# Patient Record
Sex: Female | Born: 1990 | Race: Black or African American | Hispanic: No | Marital: Married | Smoking: Never smoker
Health system: Southern US, Community
[De-identification: ages and names within clinical notes are randomized; demographics above are authoritative.]

## PROBLEM LIST (undated history)

## (undated) ENCOUNTER — Inpatient Hospital Stay (HOSPITAL_COMMUNITY): Payer: Self-pay

## (undated) DIAGNOSIS — L258 Unspecified contact dermatitis due to other agents: Secondary | ICD-10-CM

## (undated) DIAGNOSIS — O10919 Unspecified pre-existing hypertension complicating pregnancy, unspecified trimester: Secondary | ICD-10-CM

## (undated) DIAGNOSIS — Z8759 Personal history of other complications of pregnancy, childbirth and the puerperium: Secondary | ICD-10-CM

## (undated) DIAGNOSIS — N200 Calculus of kidney: Secondary | ICD-10-CM

## (undated) HISTORY — PX: TONSILLECTOMY: SUR1361

## (undated) HISTORY — DX: Unspecified pre-existing hypertension complicating pregnancy, unspecified trimester: O10.919

## (undated) HISTORY — DX: Unspecified contact dermatitis due to other agents: L25.8

## (undated) HISTORY — DX: Personal history of other complications of pregnancy, childbirth and the puerperium: Z87.59

---
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
## 2010-02-10 ENCOUNTER — Encounter: Payer: Self-pay | Admitting: Family Medicine

## 2010-02-10 LAB — CONVERTED CEMR LAB: Vit D, 25-Hydroxy: 22 ng/mL — ABNORMAL LOW (ref 30–89)

## 2010-04-06 ENCOUNTER — Ambulatory Visit: Payer: Self-pay | Admitting: Obstetrics and Gynecology

## 2010-04-10 ENCOUNTER — Ambulatory Visit (HOSPITAL_COMMUNITY): Admission: RE | Admit: 2010-04-10 | Discharge: 2010-04-10 | Payer: Self-pay | Admitting: Family Medicine

## 2011-01-30 NOTE — Assessment & Plan Note (Signed)
NAME:  Cynthia Crawford, Cynthia Crawford            ACCOUNT NO.:  [id number]   MEDICAL RECORD NO.:  [id number]          PATIENT TYPE:  POB   LOCATION:  [facility] at [facility]         FACILITY:  [facility]   PHYSICIAN:  Tinnie Gens, MD        DATE OF BIRTH:  12-May-1991   DATE OF SERVICE:  11/24/2009                                  CLINIC NOTE   CHIEF COMPLAINT:  Questionable heavy cycles, presyncope, and low iron.   HISTORY OF PRESENT ILLNESS:  This is an 20 year old nullipara, who is  not on any birth control and has 5-day cycles.  She states she has  passed out at school and had a CBC drawn and was told her iron was less,  she does not remember the exact number.  The patient also needs Gardasil  #2 today.  The patient has no interest in birth control at present.  She  had a physical in June of last year.  She has not needed Pap smears as  yet.  She reports having some anxiety and getting hot and almost passed  out when she gets her hair done as well.  She does not eat anything that  is not a food and she has no history of abnormal hemoglobins that she  knows of.   PHYSICAL EXAMINATION:  VITAL SIGNS:  Today, her blood pressure is 126/86  and weight is 116.  GENERAL:  She is a well-developed, well-nourished female, in no acute  distress.  ABDOMEN:  Soft, nontender, nondistended.   IMPRESSION:  1. Questionable heavy cycles with no desire to be placed on birth      control for modulation of set cycles.  2. History of low iron.  3. Presyncope, questionably related to history of low iron.   PLAN:  Check CBC, hemoglobin electrophoresis, iron level, ferritin, RBC,  folate, B12, and vitamin D levels today.           ______________________________  Tinnie Gens, MD     TP/MEDQ  D:  11/24/2009  T:  11/25/2009  Job:  [id number]

## 2011-01-30 NOTE — Assessment & Plan Note (Signed)
NAME:  [name], [name]            ACCOUNT NO.:  [id number]   MEDICAL RECORD NO.:  [id number]          PATIENT TYPE:  POB   LOCATION:  [facility] at [facility]         FACILITY:  [facility]   PHYSICIAN:  Allie Bossier, MD        DATE OF BIRTH:  31-Dec-1990   DATE OF SERVICE:  02/28/2009                                  CLINIC NOTE   Cynthia Crawford is an 20 year old single white gravida 0, who is here for her  annual exam.  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  She has no  particular complaints.   PAST MEDICAL HISTORY:  None.   PAST SURGICAL HISTORY:  None.   ALLERGIES:  No known latex allergies.   DRUG ALLERGIES:  PENICILLIN and SULFA cause a rash.   MEDICATIONS:  Zyrtec 1 a day as necessary.   REVIEW OF SYSTEMS:  She has been monogamous with her only sexual partner  for the last 18 months.  She is using condoms faithfully menarche was  at 20 years of age.  All other remaining review of review of systems  questions are negative.  She also repeat Pap.   PHYSICAL EXAMINATION:  VITAL SIGNS:  Stable.  GENERAL:  Afebrile, well-nourished, well-hydrated, pleasant black  female, in no apparent distress.  HEENT:  Normal breast exam normal bilaterally.  HEART:  Regular rate and rhythm.  LUNGS:  Clear to auscultation bilaterally.  ABDOMEN:  Scaphoid benign.  No hepatosplenomegaly.  EXTERNAL GENITALIA:  Shaved no lesions.  Cervix nulliparous.  Normal  discharge.  No cervical motion tenderness.  BIMANUAL:  Small, retroverted mobile uterus.  Adnexa are nontender.  No  masses.   ASSESSMENT AND PLAN:  1. Annual exam.  I have recommended she start getting Pap smears at      age 33.  I did cervical cultures today.  She declines alternative      forms of birth control.  2. I filled her physical exam form.  She will get her vaccination      history from her pediatrician.  I discussed Gardasil.  She will get      her first one  today and the next one in 2 months, and the third one      in 6 months.      Allie Bossier, MD     MCD/MEDQ  D:  02/28/2009  T:  03/01/2009  Job:  [id number]

## 2011-01-30 NOTE — Assessment & Plan Note (Signed)
NAMESHYNE, RESCH            ACCOUNT NO.:  [id number]   MEDICAL RECORD NO.:  [id number]          PATIENT TYPE:  POB   LOCATION:  [facility] at [facility]         FACILITY:  [facility]   PHYSICIAN:  Argentina Donovan, MD        DATE OF BIRTH:  [birth year]   DATE OF SERVICE:  04/06/2010                                  CLINIC NOTE   The patient is a 20 year old nulligravida, African American female,  sophomore nursing student who yesterday had the onset of her period with  severe abdominal pain, nausea, and vomiting that has abated completely  today.  She never had that happen with a period except the very first  time she had a period.   PHYSICAL EXAMINATION:  ABDOMEN:  Soft, flat, nontender.  No masses or  organomegaly.  GENITALIA:  The patient has not had a genital examination in the past.   Being sexually active, here with her mother, and I described the lesser  cycle of the prostaglandin and the fact that may cause these menstrual  cramps and that use of ibuprofen cyclically or birth control pills would  probably control that.  We are going to get her an ultrasound just to  make sure we are not missing ovarian cyst or something similar.  They  seemed to understand the problem and are able to handle that quite  easily.   IMPRESSION:  One episode, severe dysmenorrhea lasting 1 day with  gastrointestinal symptoms.           ______________________________  Argentina Donovan, MD     PR/MEDQ  D:  04/06/2010  T:  04/07/2010  Job:  [id number]

## 2011-03-20 ENCOUNTER — Other Ambulatory Visit: Payer: Self-pay | Admitting: Family Medicine

## 2011-03-20 NOTE — Telephone Encounter (Signed)
Refill request

## 2011-09-25 ENCOUNTER — Ambulatory Visit (INDEPENDENT_AMBULATORY_CARE_PROVIDER_SITE_OTHER): Payer: BC Managed Care – PPO | Admitting: Nurse Practitioner

## 2011-09-25 ENCOUNTER — Encounter: Payer: Self-pay | Admitting: Nurse Practitioner

## 2011-09-25 VITALS — BP 128/84 | HR 72 | Ht 62.0 in | Wt 120.0 lb

## 2011-09-25 DIAGNOSIS — Z23 Encounter for immunization: Secondary | ICD-10-CM

## 2011-09-25 DIAGNOSIS — Z309 Encounter for contraceptive management, unspecified: Secondary | ICD-10-CM

## 2011-09-25 DIAGNOSIS — G43909 Migraine, unspecified, not intractable, without status migrainosus: Secondary | ICD-10-CM

## 2011-09-25 DIAGNOSIS — E559 Vitamin D deficiency, unspecified: Secondary | ICD-10-CM

## 2011-09-25 DIAGNOSIS — Z01419 Encounter for gynecological examination (general) (routine) without abnormal findings: Secondary | ICD-10-CM

## 2011-09-25 DIAGNOSIS — Z0000 Encounter for general adult medical examination without abnormal findings: Secondary | ICD-10-CM

## 2011-09-25 NOTE — Progress Notes (Signed)
Subjective:     Patient ID: Cynthia Crawford, female   DOB: 03/06/1991, 21 y.o.   MRN: [id number]  HPI Pt in today for annual GYN exam. Currently sexually active and not using protection. Her parents are preachers and she does not want them to know she is sexually active. 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 C/O migraine and anxiety. Wants flu vaccine and Vit D checked.   Review of Systems  Constitutional: Positive for fatigue. Negative for chills, diaphoresis, activity change and appetite change.  Respiratory: Negative for wheezing.   Neurological: Positive for light-headedness and headaches.  Psychiatric/Behavioral: The patient is nervous/anxious.        Objective:   Physical Exam  Constitutional: She is oriented to person, place, and time. She appears well-developed and well-nourished.  HENT:  Head: Normocephalic and atraumatic.  Eyes: Pupils are equal, round, and reactive to light.  Neck: Normal range of motion. No tracheal deviation present. No thyromegaly present.  Cardiovascular: Normal rate and regular rhythm.  Exam reveals no gallop and no friction rub.   No murmur heard. Pulmonary/Chest: No respiratory distress. She has no wheezes. She has no rales. She exhibits no tenderness.  Abdominal: She exhibits no distension and no mass. There is no tenderness. There is no rebound and no guarding.  Genitourinary: Vagina normal and uterus normal.  Musculoskeletal: Normal range of motion.  Neurological: She is alert and oriented to person, place, and time. She has normal reflexes.  Skin: Skin is warm and dry.  Psychiatric: She has a normal mood and affect.       Assessment:     Annual GYN Exam Contraception Migraine Anxiety    Plan:     GC/ Chlamydia done today. Will start pap smears at age 21. Lengthy discussion concerning birth control options. Advised she may use [facility] or Health Dept or school Health without her parents knowledge. Given Flu vaccine  today. Will check Vit D levels per pt request. Advised she can make a follow up appointment for headaches and anxiety. Declines refill Proventil.

## 2011-09-26 LAB — VITAMIN D 25 HYDROXY (VIT D DEFICIENCY, FRACTURES): Vit D, 25-Hydroxy: 26 ng/mL — ABNORMAL LOW (ref 30–89)

## 2011-09-26 LAB — GC/CHLAMYDIA PROBE AMP, GENITAL: GC Probe Amp, Genital: NEGATIVE

## 2012-08-29 ENCOUNTER — Other Ambulatory Visit: Payer: Self-pay | Admitting: Family Medicine

## 2012-09-19 ENCOUNTER — Ambulatory Visit: Payer: BC Managed Care – PPO | Admitting: Obstetrics & Gynecology

## 2012-09-26 ENCOUNTER — Ambulatory Visit (INDEPENDENT_AMBULATORY_CARE_PROVIDER_SITE_OTHER): Payer: BC Managed Care – PPO | Admitting: Obstetrics & Gynecology

## 2012-09-26 ENCOUNTER — Encounter: Payer: Self-pay | Admitting: Obstetrics & Gynecology

## 2012-09-26 VITALS — BP 135/98 | HR 80 | Ht 64.0 in | Wt 115.0 lb

## 2012-09-26 DIAGNOSIS — Z113 Encounter for screening for infections with a predominantly sexual mode of transmission: Secondary | ICD-10-CM

## 2012-09-26 DIAGNOSIS — Z0000 Encounter for general adult medical examination without abnormal findings: Secondary | ICD-10-CM

## 2012-09-26 DIAGNOSIS — Z124 Encounter for screening for malignant neoplasm of cervix: Secondary | ICD-10-CM

## 2012-09-26 DIAGNOSIS — Z1151 Encounter for screening for human papillomavirus (HPV): Secondary | ICD-10-CM

## 2012-09-26 DIAGNOSIS — Z01419 Encounter for gynecological examination (general) (routine) without abnormal findings: Secondary | ICD-10-CM

## 2012-09-26 DIAGNOSIS — Z23 Encounter for immunization: Secondary | ICD-10-CM

## 2012-09-26 NOTE — Patient Instructions (Signed)
Contraception Choices  Contraception (birth control) is the use of any methods or devices to prevent pregnancy. Below are some methods to help avoid pregnancy.  HORMONAL METHODS   · Contraceptive implant. This is a thin, plastic tube containing progesterone hormone. It does not contain estrogen hormone. Your caregiver inserts the tube in the inner part of the upper arm. The tube can remain in place for up to 3 years. After 3 years, the implant must be removed. The implant prevents the ovaries from releasing an egg (ovulation), thickens the cervical mucus which prevents sperm from entering the uterus, and thins the lining of the inside of the uterus.  · Progesterone-only injections. These injections are given every 3 months by your caregiver to prevent pregnancy. This synthetic progesterone hormone stops the ovaries from releasing eggs. It also thickens cervical mucus and changes the uterine lining. This makes it harder for sperm to survive in the uterus.  · Birth control pills. These pills contain estrogen and progesterone hormone. They work by stopping the egg from forming in the ovary (ovulation). Birth control pills are prescribed by a caregiver. Birth control pills can also be used to treat heavy periods.  · Minipill. This type of birth control pill contains only the progesterone hormone. They are taken every day of each month and must be prescribed by your caregiver.  · Birth control patch. The patch contains hormones similar to those in birth control pills. It must be changed once a week and is prescribed by a caregiver.  · Vaginal ring. The ring contains hormones similar to those in birth control pills. It is left in the vagina for 3 weeks, removed for 1 week, and then a new one is put back in place. The patient must be comfortable inserting and removing the ring from the vagina. A caregiver's prescription is necessary.  · Emergency contraception. Emergency contraceptives prevent pregnancy after unprotected  sexual intercourse. This pill can be taken right after sex or up to 5 days after unprotected sex. It is most effective the sooner you take the pills after having sexual intercourse. Emergency contraceptive pills are available without a prescription. Check with your pharmacist. Do not use emergency contraception as your only form of birth control.  BARRIER METHODS   · Female condom. This is a thin sheath (latex or rubber) that is worn over the penis during sexual intercourse. It can be used with spermicide to increase effectiveness.  · Female condom. This is a soft, loose-fitting sheath that is put into the vagina before sexual intercourse.  · Diaphragm. This is a soft, latex, dome-shaped barrier that must be fitted by a caregiver. It is inserted into the vagina, along with a spermicidal jelly. It is inserted before intercourse. The diaphragm should be left in the vagina for 6 to 8 hours after intercourse.  · Cervical cap. This is a round, soft, latex or plastic cup that fits over the cervix and must be fitted by a caregiver. The cap can be left in place for up to 48 hours after intercourse.  · Sponge. This is a soft, circular piece of polyurethane foam. The sponge has spermicide in it. It is inserted into the vagina after wetting it and before sexual intercourse.  · Spermicides. These are chemicals that kill or block sperm from entering the cervix and uterus. They come in the form of creams, jellies, suppositories, foam, or tablets. They do not require a prescription. They are inserted into the vagina with an applicator before having sexual intercourse.   The process must be repeated every time you have sexual intercourse.  INTRAUTERINE CONTRACEPTION  · Intrauterine device (IUD). This is a T-shaped device that is put in a woman's uterus during a menstrual period to prevent pregnancy. There are 2 types:  · Copper IUD. This type of IUD is wrapped in copper wire and is placed inside the uterus. Copper makes the uterus and  fallopian tubes produce a fluid that kills sperm. It can stay in place for 10 years.  · Hormone IUD. This type of IUD contains the hormone progestin (synthetic progesterone). The hormone thickens the cervical mucus and prevents sperm from entering the uterus, and it also thins the uterine lining to prevent implantation of a fertilized egg. The hormone can weaken or kill the sperm that get into the uterus. It can stay in place for 5 years.  PERMANENT METHODS OF CONTRACEPTION  · Female tubal ligation. This is when the woman's fallopian tubes are surgically sealed, tied, or blocked to prevent the egg from traveling to the uterus.  · Female sterilization. This is when the female has the tubes that carry sperm tied off (vasectomy). This blocks sperm from entering the vagina during sexual intercourse. After the procedure, the man can still ejaculate fluid (semen).  NATURAL PLANNING METHODS  · Natural family planning. This is not having sexual intercourse or using a barrier method (condom, diaphragm, cervical cap) on days the woman could become pregnant.  · Calendar method. This is keeping track of the length of each menstrual cycle and identifying when you are fertile.  · Ovulation method. This is avoiding sexual intercourse during ovulation.  · Symptothermal method. This is avoiding sexual intercourse during ovulation, using a thermometer and ovulation symptoms.  · Post-ovulation method. This is timing sexual intercourse after you have ovulated.  Regardless of which type or method of contraception you choose, it is important that you use condoms to protect against the transmission of sexually transmitted diseases (STDs). Talk with your caregiver about which form of contraception is most appropriate for you.  Document Released: 09/03/2005 Document Revised: 11/26/2011 Document Reviewed: 01/10/2011  ExitCare® Patient Information ©2013 [employer].

## 2012-09-26 NOTE — Progress Notes (Signed)
Subjective:    Cynthia Crawford is a 22 y.o. female who presents for an annual exam. The patient has no complaints today. She does not want a pregnancy but still has unprotected sex. She did not go to the health dept as Bonita Quin recommended last year. She does not want her mother to see on a bill that she is getting birth control. I have again advised the health dept. I have also recommended folic acid daily. The patient is sexually active. This will be her first pap smear.The patient wears seatbelts: yes. The patient participates in regular exercise: yes. Has the patient ever been transfused or tattooed?: no. The patient reports that there is not domestic violence in her life.   Menstrual History: OB History    Grav Para Term Preterm Abortions TAB SAB Ect Mult Living   0 0 0 0 0 0 0 0 0 0       Menarche age: 18 Patient's last menstrual period was 09/17/2012.    The following portions of the patient's history were reviewed and updated as appropriate: allergies, current medications, past family history, past medical history, past social history, past surgical history and problem list.  Review of Systems A comprehensive review of systems was negative. 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 She says that she has had Gardasil series. Flu vaccine today.   Objective:    BP 135/98  Pulse 80  Ht 5\' 4"  (1.626 m)  Wt 115 lb (52.164 kg)  BMI 19.74 kg/m2  LMP 09/17/2012.   Heart- rrr without m,r,g Lungs- CTAB Breast exam- normal BL Abd- benign Vulva- shaved, no lesions NSSA, NT, normal adnexa exam Assessment:    Healthy female exam.    Plan:     Chlamydia specimen. GC specimen. Thin prep Pap smear.  I have definitely recommended birth control. Flu vaccine today.

## 2012-10-06 ENCOUNTER — Ambulatory Visit: Payer: BC Managed Care – PPO | Admitting: *Deleted

## 2013-04-15 ENCOUNTER — Ambulatory Visit: Payer: Self-pay | Admitting: Otolaryngology

## 2013-04-20 ENCOUNTER — Ambulatory Visit (INDEPENDENT_AMBULATORY_CARE_PROVIDER_SITE_OTHER): Payer: BC Managed Care – PPO | Admitting: Obstetrics & Gynecology

## 2013-04-20 ENCOUNTER — Encounter: Payer: Self-pay | Admitting: Obstetrics & Gynecology

## 2013-04-20 VITALS — BP 137/91 | HR 102 | Ht 64.0 in | Wt 112.0 lb

## 2013-04-20 DIAGNOSIS — R300 Dysuria: Secondary | ICD-10-CM

## 2013-04-20 LAB — POCT URINALYSIS DIPSTICK
Bilirubin, UA: NEGATIVE
Glucose, UA: NEGATIVE
Nitrite, UA: NEGATIVE
Protein, UA: NEGATIVE

## 2013-04-20 NOTE — Progress Notes (Signed)
  Subjective:    Patient ID: Cynthia Crawford, female    DOB: [birth year], 22 y.o.   MRN: [id number]  HPI  She is here today because of some dysuria. She had a tonsilectomy 4 days ago and is drinking water.   Review of Systems     Objective:   Physical Exam  U/A negative No CVAT      Assessment & Plan:   Dysuria- send uc&s Rec establish care with family medicine

## 2013-04-21 LAB — URINE CULTURE: Colony Count: NO GROWTH

## 2013-08-26 ENCOUNTER — Other Ambulatory Visit (INDEPENDENT_AMBULATORY_CARE_PROVIDER_SITE_OTHER): Payer: BC Managed Care – PPO | Admitting: *Deleted

## 2013-08-26 DIAGNOSIS — E569 Vitamin deficiency, unspecified: Secondary | ICD-10-CM

## 2013-08-26 DIAGNOSIS — Z23 Encounter for immunization: Secondary | ICD-10-CM

## 2013-08-28 NOTE — Progress Notes (Signed)
Left message for patient to call me back to confirm receipt of message regarding test results and Dr. Berle Mull to refer patient to primary care regarding her low vitamin D levels.

## 2013-08-28 NOTE — Progress Notes (Signed)
Left message for patient regarding results and instructions.  She is to call back to confirm receipt of message.

## 2013-12-25 ENCOUNTER — Encounter: Payer: Self-pay | Admitting: Obstetrics & Gynecology

## 2013-12-25 ENCOUNTER — Ambulatory Visit (INDEPENDENT_AMBULATORY_CARE_PROVIDER_SITE_OTHER): Payer: BC Managed Care – PPO | Admitting: Obstetrics & Gynecology

## 2013-12-25 VITALS — BP 146/99 | HR 79 | Ht 63.0 in | Wt 117.0 lb

## 2013-12-25 DIAGNOSIS — R102 Pelvic and perineal pain: Secondary | ICD-10-CM

## 2013-12-25 DIAGNOSIS — N949 Unspecified condition associated with female genital organs and menstrual cycle: Secondary | ICD-10-CM

## 2013-12-25 DIAGNOSIS — R599 Enlarged lymph nodes, unspecified: Secondary | ICD-10-CM

## 2013-12-25 LAB — POCT URINALYSIS DIPSTICK
BILIRUBIN UA: NEGATIVE
GLUCOSE UA: NEGATIVE
KETONES UA: NEGATIVE
Leukocytes, UA: NEGATIVE
NITRITE UA: NEGATIVE
PROTEIN UA: NEGATIVE
RBC UA: NEGATIVE
SPEC GRAV UA: 1.01
Urobilinogen, UA: NEGATIVE
pH, UA: 6.5

## 2013-12-25 NOTE — Progress Notes (Signed)
Subjective:     Patient ID: Cynthia DaysChasity Crawford, female   DOB: 05/14/1991, 23 y.o.   MRN: [id number]  HPI Pt c/o pain in her groin.  She denies discharge or internal pain. She thinks that she 'might'  have had some urinary sx that seem to have resolved; she also c/o of a rash on her arm that she woke up with.   Review of Systems     Objective:   Physical Exam BP 146/99  Pulse 79  Ht 5\' 3"  (1.6 m)  Wt 117 lb (53.071 kg)  BMI 20.73 kg/m2  LMP 11/29/2013 Pt in NAD Abd: soft, NT, ND GU: EGBUS: very small mobile lymph node palpated in right groin  Cervix: no CMT Uterus: small, mobile Adnexa: no masses; non tender   UA: neg        Assessment:     Pain in right groin- lymph node palpated  Small area of excoriation on right arm    Plan:     Hydrocortisone to rash on right arm F/u prn

## 2013-12-25 NOTE — Patient Instructions (Signed)
Swollen Lymph Nodes  The lymphatic system filters fluid from around cells. It is like a system of blood vessels. These channels carry lymph instead of blood. The lymphatic system is an important part of the immune (disease fighting) system. When people talk about "swollen glands in the neck," they are usually talking about swollen lymph nodes. The lymph nodes are like the little traps for infection. You and your caregiver may be able to feel lymph nodes, especially swollen nodes, in these common areas: the groin (inguinal area), armpits (axilla), and above the clavicle (supraclavicular). You may also feel them in the neck (cervical) and the back of the head just above the hairline (occipital).  Swollen glands occur when there is any condition in which the body responds with an allergic type of reaction. For instance, the glands in the neck can become swollen from insect bites or any type of minor infection on the head. These are very noticeable in children with only minor problems. Lymph nodes may also become swollen when there is a tumor or problem with the lymphatic system, such as Hodgkin's disease.  TREATMENT   · Most swollen glands do not require treatment. They can be observed (watched) for a short period of time, if your caregiver feels it is necessary. Most of the time, observation is not necessary.  · Antibiotics (medicines that kill germs) may be prescribed by your caregiver. Your caregiver may prescribe these if he or she feels the swollen glands are due to a bacterial (germ) infection. Antibiotics are not used if the swollen glands are caused by a virus.  HOME CARE INSTRUCTIONS   · Take medications as directed by your caregiver. Only take over-the-counter or prescription medicines for pain, discomfort, or fever as directed by your caregiver.  SEEK MEDICAL CARE IF:   · If you begin to run a temperature greater than 102° F (38.9° C), or as your caregiver suggests.  MAKE SURE YOU:   · Understand these  instructions.  · Will watch your condition.  · Will get help right away if you are not doing well or get worse.  Document Released: 08/24/2002 Document Revised: 11/26/2011 Document Reviewed: 09/03/2005  ExitCare® Patient Information ©2014 [employer].

## 2014-01-26 ENCOUNTER — Ambulatory Visit: Payer: BC Managed Care – PPO | Admitting: Obstetrics & Gynecology

## 2014-05-07 ENCOUNTER — Ambulatory Visit (INDEPENDENT_AMBULATORY_CARE_PROVIDER_SITE_OTHER): Payer: BC Managed Care – PPO | Admitting: Family Medicine

## 2014-05-07 ENCOUNTER — Encounter: Payer: Self-pay | Admitting: Family Medicine

## 2014-05-07 VITALS — BP 139/89 | HR 69 | Ht 63.0 in | Wt 117.0 lb

## 2014-05-07 DIAGNOSIS — J029 Acute pharyngitis, unspecified: Secondary | ICD-10-CM

## 2014-05-07 DIAGNOSIS — R319 Hematuria, unspecified: Secondary | ICD-10-CM | POA: Diagnosis not present

## 2014-05-07 LAB — POCT URINALYSIS DIPSTICK
BILIRUBIN UA: NEGATIVE
Glucose, UA: NEGATIVE
Ketones, UA: NEGATIVE
Leukocytes, UA: NEGATIVE
NITRITE UA: NEGATIVE
PH UA: 6
Protein, UA: NEGATIVE
SPEC GRAV UA: 1.015
UROBILINOGEN UA: NEGATIVE

## 2014-05-07 MED ORDER — NITROFURANTOIN MONOHYD MACRO 100 MG PO CAPS: 100.0000 mg | ORAL_CAPSULE | Freq: Two times a day (BID) | ORAL | Status: DC

## 2014-05-07 NOTE — Patient Instructions (Signed)

## 2014-05-07 NOTE — Progress Notes (Signed)
    Subjective:    Patient ID: Cynthia Crawford is a 23 y.o. female presenting with Hematuria  on 05/07/2014  HPI: Yesterday had frank blood in her urine.  Had GI bug on yesterday.  Dull flank pain on right side.  Reports some urgency. No hesitancy. She also denies previous UTI. Sore Throat on 3 previous days although she has had a tonsillectomy.  Feels like something might be stuck in her throat.  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  Review of Systems  Constitutional: Negative for fever and chills.  Genitourinary: Positive for dysuria, frequency and flank pain.  Skin: Negative for rash.      Objective:    BP 139/89  Pulse 69  Ht 5\' 3"  (1.6 m)  Wt 117 lb (53.071 kg)  BMI 20.73 kg/m2  LMP 04/21/2014 Physical Exam  Constitutional: She appears well-developed and well-nourished. No distress.  HENT:  Head: Normocephalic and atraumatic.  Eyes: No scleral icterus.  Neck: Neck supple.  Cardiovascular: Normal rate and regular rhythm.   Pulmonary/Chest: Effort normal and breath sounds normal.  Abdominal: Soft. She exhibits no mass. There is no tenderness. There is no rebound.  Musculoskeletal: Normal range of motion. She exhibits no tenderness (no CVA).  Neurological: She is alert.  Skin: Skin is warm. No rash noted.  Psychiatric: She has a normal mood and affect.    Urinalysis  Color YELLOW   Clarity CLOUDY   Glucose NEGATIVE   Bilirubin NEGATIVE   Ketones NEGATIVE   Spec Grav  1.015   Blood  3+   pH 6.0   Protein NEGATIVE   Urobilinogen negative   Nitrite NEGATIVE   Leukocytes Negative         Assessment & Plan:  Blood in urine - Plan: POCT Urinalysis Dipstick, Urine Culture, Basic Metabolic Panel (BMET), nitrofurantoin, macrocrystal-monohydrate, (MACROBID) 100 MG capsule, Grp A Strep, CANCELED: Rapid Strep screen  Sore throat - Plan: Grp A Strep  Multiple confounding factors at play.  No true UTI symptoms.  Not a lot of pain like a  stone. Also has recent history of AGE and sore throat with exposure to kids.  Will send culture and check kidney function.  Discussed kidney stones and normal treatment.  Push fluids.  If all cultures and labs are negative, would repeat U/A and refer to PCP if persistent hematuria.  Could order CT for stone rule out--but exam is not impressive today. Presumptive treatment for UTI done as this is most likely diagnosis--hemorrhagic cystitis.  Return if symptoms worsen or fail to improve.

## 2014-05-08 LAB — BASIC METABOLIC PANEL
BUN: 7 mg/dL (ref 6–23)
CO2: 27 meq/L (ref 19–32)
Calcium: 9 mg/dL (ref 8.4–10.5)
Chloride: 102 mEq/L (ref 96–112)
Creat: 0.72 mg/dL (ref 0.50–1.10)
Glucose, Bld: 79 mg/dL (ref 70–99)
Potassium: 3.9 mEq/L (ref 3.5–5.3)
Sodium: 139 mEq/L (ref 135–145)

## 2014-05-10 LAB — CULTURE, GROUP A STREP: ORGANISM ID, BACTERIA: NORMAL

## 2014-05-12 ENCOUNTER — Ambulatory Visit: Payer: BC Managed Care – PPO | Admitting: *Deleted

## 2014-05-12 DIAGNOSIS — R319 Hematuria, unspecified: Secondary | ICD-10-CM

## 2014-05-12 NOTE — Progress Notes (Signed)
Patients urine culture was not received by the laboratory so we are recollecting sample to resend.

## 2014-05-14 LAB — CULTURE, URINE COMPREHENSIVE
Colony Count: NO GROWTH
Organism ID, Bacteria: NO GROWTH

## 2015-06-17 ENCOUNTER — Telehealth: Payer: Self-pay | Admitting: *Deleted

## 2015-06-17 MED ORDER — PROMETHAZINE HCL 25 MG PO TABS
25.0000 mg | ORAL_TABLET | Freq: Four times a day (QID) | ORAL | Status: DC | PRN
Start: 2015-06-17 — End: 2016-01-17

## 2015-06-17 NOTE — Telephone Encounter (Signed)
Pt has been experiencing nausea and headaches, reports a BP of 130/91.  Pt has a hx of migraine headaches, informed her that they may increase due to the hormone changes with pregnancy.  Instructed to take Tylenol prn and drink plenty of fluids.  Sent rx for Phenergan to pharmacy for nausea.  Pt will see how she feels over the weekend and will call back Monday if the headaches continue and she needs to be seen by Clydie Braun, headache specialist.  Pt acknowledged instructions.

## 2015-06-27 ENCOUNTER — Encounter: Payer: Self-pay | Admitting: Obstetrics & Gynecology

## 2015-06-27 ENCOUNTER — Ambulatory Visit (INDEPENDENT_AMBULATORY_CARE_PROVIDER_SITE_OTHER): Payer: BLUE CROSS/BLUE SHIELD | Admitting: Obstetrics & Gynecology

## 2015-06-27 VITALS — BP 143/96 | HR 86 | Wt 129.0 lb

## 2015-06-27 DIAGNOSIS — Z3401 Encounter for supervision of normal first pregnancy, first trimester: Secondary | ICD-10-CM | POA: Diagnosis not present

## 2015-06-27 DIAGNOSIS — Z23 Encounter for immunization: Secondary | ICD-10-CM | POA: Diagnosis not present

## 2015-06-27 DIAGNOSIS — Z3403 Encounter for supervision of normal first pregnancy, third trimester: Secondary | ICD-10-CM | POA: Insufficient documentation

## 2015-06-27 DIAGNOSIS — Z124 Encounter for screening for malignant neoplasm of cervix: Secondary | ICD-10-CM | POA: Diagnosis not present

## 2015-06-27 DIAGNOSIS — Z113 Encounter for screening for infections with a predominantly sexual mode of transmission: Secondary | ICD-10-CM

## 2015-06-27 NOTE — Progress Notes (Signed)
Bedside ultrasound today measures [redacted]w[redacted]d fetus with heartbeat.  

## 2015-06-27 NOTE — Addendum Note (Signed)
Addended by: Tandy Gaw C on: 06/27/2015 04:00 PM   Modules accepted: Orders

## 2015-06-27 NOTE — Assessment & Plan Note (Signed)
BABYSCRIPTS PATIENT: [X] initial, [ ] 12, [ ] 20, [ ] 28, [ ] 32, [ ] 36, [ ] 38, [ ] 39, [ ] 40 

## 2015-06-27 NOTE — Progress Notes (Signed)
   Subjective:    Cynthia Crawford is a MAA G1P0000 [redacted]w[redacted]d being seen today for her first obstetrical visit.  Her obstetrical history is significant for none. Patient does intend to breast feed. Pregnancy history fully reviewed.  Patient reports no complaints.  Filed Vitals:   06/27/15 1515  BP: 143/96  Pulse: 86  Weight: 129 lb (58.514 kg)    HISTORY: OB History  Gravida Para Term Preterm AB SAB TAB Ectopic Multiple Living     # Outcome Date GA Lbr Len/2nd Weight Sex Delivery Anes PTL Lv  1 Current              Past Medical History  Diagnosis Date  . Asthma   . Anxiety    Past Surgical History  Procedure Laterality Date  . Tonsillectomy     Family History  Problem Relation Age of Onset  . Diabetes Father   . Asthma Mother      Exam    Uterus:     Pelvic Exam:    Perineum: No Hemorrhoids   Vulva: normal   Vagina:  normal mucosa, normal discharge   pH:    Cervix: anteverted   Adnexa: normal adnexa   Bony Pelvis: android  System: Breast:  normal appearance, no masses or tenderness   Skin: normal coloration and turgor, no rashes    Neurologic: oriented   Extremities: normal strength, tone, and muscle mass   HEENT PERRLA   Mouth/Teeth mucous membranes moist, pharynx normal without lesions   Neck supple   Cardiovascular: regular rate and rhythm   Respiratory:  appears well, vitals normal, no respiratory distress, acyanotic, normal RR, ear and throat exam is normal, neck free of mass or lymphadenopathy, chest clear, no wheezing, crepitations, rhonchi, normal symmetric air entry   Abdomen: soft, non-tender; bowel sounds normal; no masses,  no organomegaly   Urinary: urethral meatus normal      Assessment:    Pregnancy: G1P0000 Patient Active Problem List   Diagnosis Date Noted  . Contraception management 09/25/2011  . Migraine 09/25/2011        Plan:     Initial labs drawn. Prenatal vitamins. Problem list reviewed and  updated. Genetic Screening discussed First Screen: undecided.  Ultrasound discussed; fetal survey: requested.  Follow up in 4 weeks. She will join [name] Flu vaccine today Cynthia Crawford. 06/27/2015

## 2015-06-28 LAB — PRENATAL PROFILE (SOLSTAS)
ANTIBODY SCREEN: NEGATIVE
BASOS ABS: 0 10*3/uL (ref 0.0–0.1)
Basophils Relative: 0 % (ref 0–1)
EOS ABS: 0.1 10*3/uL (ref 0.0–0.7)
EOS PCT: 1 % (ref 0–5)
HCT: 34.7 % — ABNORMAL LOW (ref 36.0–46.0)
HIV: NONREACTIVE
Hemoglobin: 11.7 g/dL — ABNORMAL LOW (ref 12.0–15.0)
Hepatitis B Surface Ag: NEGATIVE
LYMPHS ABS: 2.1 10*3/uL (ref 0.7–4.0)
Lymphocytes Relative: 23 % (ref 12–46)
MCH: 29.7 pg (ref 26.0–34.0)
MCHC: 33.7 g/dL (ref 30.0–36.0)
MCV: 88.1 fL (ref 78.0–100.0)
MONO ABS: 0.8 10*3/uL (ref 0.1–1.0)
MPV: 11.6 fL (ref 8.6–12.4)
Monocytes Relative: 9 % (ref 3–12)
Neutro Abs: 6 10*3/uL (ref 1.7–7.7)
Neutrophils Relative %: 67 % (ref 43–77)
PLATELETS: 195 10*3/uL (ref 150–400)
RBC: 3.94 MIL/uL (ref 3.87–5.11)
RDW: 13.1 % (ref 11.5–15.5)
RH TYPE: POSITIVE
Rubella: 3.36 Index — ABNORMAL HIGH (ref <0.90)
WBC: 9 10*3/uL (ref 4.0–10.5)

## 2015-06-29 LAB — CULTURE, OB URINE
COLONY COUNT: NO GROWTH
Organism ID, Bacteria: NO GROWTH

## 2015-06-30 LAB — CYTOLOGY - PAP

## 2015-07-25 ENCOUNTER — Ambulatory Visit (INDEPENDENT_AMBULATORY_CARE_PROVIDER_SITE_OTHER): Payer: BLUE CROSS/BLUE SHIELD | Admitting: Obstetrics and Gynecology

## 2015-07-25 ENCOUNTER — Encounter: Payer: Self-pay | Admitting: Obstetrics and Gynecology

## 2015-07-25 VITALS — BP 133/88 | HR 80 | Wt 130.0 lb

## 2015-07-25 DIAGNOSIS — Z3401 Encounter for supervision of normal first pregnancy, first trimester: Secondary | ICD-10-CM

## 2015-07-25 NOTE — Progress Notes (Signed)
Subjective:  Cynthia Crawford is a 24 y.o. G1P0000 at 6934w0d being seen today for ongoing prenatal care.  Patient reports no complaints.  Contractions: Not present.  Vag. Bleeding: None. Movement: Absent. Denies leaking of fluid.   The following portions of the patient's history were reviewed and updated as appropriate: allergies, current medications, past family history, past medical history, past social history, past surgical history and problem list. Problem list updated.  Objective:   Filed Vitals:   07/25/15 1500  BP: 133/88  Pulse: 80  Weight: 130 lb (58.968 kg)    Fetal Status: Fetal Heart Rate (bpm): 164   Movement: Absent     General:  Alert, oriented and cooperative. Patient is in no acute distress.  Skin: Skin is warm and dry. No rash noted.   Cardiovascular: Normal heart rate noted  Respiratory: Normal respiratory effort, no problems with respiration noted  Abdomen: Soft, gravid, appropriate for gestational age. Pain/Pressure: Absent     Pelvic: Vag. Bleeding: None Vag D/C Character: Thin   Cervical exam deferred        Extremities: Normal range of motion.  Edema: None  Mental Status: Normal mood and affect. Normal behavior. Normal judgment and thought content.   Urinalysis: Urine Protein: Negative Urine Glucose: Negative  Assessment and Plan:  Pregnancy: G1P0000 at 1634w0d  1. Supervision of normal first pregnancy in first trimester Patient is doing well without complaints Patient considering Quad screen at next visit Anatomy ultrasound ordered RTC at 20 weeks as per babyscripts schedule  General obstetric precautions including but not limited to vaginal bleeding, contractions, leaking of fluid and fetal movement were reviewed in detail with the patient. Please refer to After Visit Summary for other counseling recommendations.  Return in about 8 weeks (around 09/19/2015).   Catalina AntiguaPeggy Luisdavid Hamblin, MD

## 2015-07-30 ENCOUNTER — Encounter: Payer: Self-pay | Admitting: Emergency Medicine

## 2015-07-30 ENCOUNTER — Emergency Department
Admission: EM | Admit: 2015-07-30 | Discharge: 2015-07-30 | Disposition: A | Discharge status: Home / Self Care | Payer: BLUE CROSS/BLUE SHIELD | Attending: Emergency Medicine | Admitting: Emergency Medicine

## 2015-07-30 DIAGNOSIS — O9989 Other specified diseases and conditions complicating pregnancy, childbirth and the puerperium: Secondary | ICD-10-CM | POA: Diagnosis present

## 2015-07-30 DIAGNOSIS — R55 Syncope and collapse: Secondary | ICD-10-CM | POA: Diagnosis not present

## 2015-07-30 DIAGNOSIS — Z3A12 12 weeks gestation of pregnancy: Secondary | ICD-10-CM | POA: Insufficient documentation

## 2015-07-30 DIAGNOSIS — Z880 Allergy status to penicillin: Secondary | ICD-10-CM | POA: Diagnosis not present

## 2015-07-30 LAB — HCG, QUANTITATIVE, PREGNANCY: hCG, Beta Chain, Quant, S: 83351 m[IU]/mL — ABNORMAL HIGH (ref <5)

## 2015-07-30 LAB — COMPREHENSIVE METABOLIC PANEL
ALK PHOS: 36 U/L — AB (ref 38–126)
ALT: 20 U/L (ref 14–54)
ANION GAP: 6 (ref 5–15)
AST: 22 U/L (ref 15–41)
Albumin: 3.7 g/dL (ref 3.5–5.0)
BUN: 8 mg/dL (ref 6–20)
CALCIUM: 9.1 mg/dL (ref 8.9–10.3)
CHLORIDE: 105 mmol/L (ref 101–111)
CO2: 23 mmol/L (ref 22–32)
Creatinine, Ser: 0.61 mg/dL (ref 0.44–1.00)
GFR calc non Af Amer: >60 mL/min (ref >60)
Glucose, Bld: 84 mg/dL (ref 65–99)
POTASSIUM: 3.8 mmol/L (ref 3.5–5.1)
SODIUM: 134 mmol/L — AB (ref 135–145)
Total Bilirubin: 0.3 mg/dL (ref 0.3–1.2)
Total Protein: 7.1 g/dL (ref 6.5–8.1)

## 2015-07-30 LAB — URINALYSIS COMPLETE WITH MICROSCOPIC (ARMC ONLY)
Bilirubin Urine: NEGATIVE
Glucose, UA: NEGATIVE mg/dL
HGB URINE DIPSTICK: NEGATIVE
Ketones, ur: NEGATIVE mg/dL
LEUKOCYTES UA: NEGATIVE
NITRITE: NEGATIVE
PH: 6 (ref 5.0–8.0)
PROTEIN: 30 mg/dL — AB
SPECIFIC GRAVITY, URINE: 1.02 (ref 1.005–1.030)

## 2015-07-30 LAB — TROPONIN I

## 2015-07-30 LAB — CBC
HEMATOCRIT: 37.6 % (ref 35.0–47.0)
HEMOGLOBIN: 12.4 g/dL (ref 12.0–16.0)
MCH: 29.7 pg (ref 26.0–34.0)
MCHC: 32.9 g/dL (ref 32.0–36.0)
MCV: 90.2 fL (ref 80.0–100.0)
Platelets: 150 10*3/uL (ref 150–440)
RBC: 4.17 MIL/uL (ref 3.80–5.20)
RDW: 13 % (ref 11.5–14.5)
WBC: 9.6 10*3/uL (ref 3.6–11.0)

## 2015-07-30 NOTE — ED Notes (Signed)
Per friend she was at her house and went to floor from standing position, pt was able to speak to her immediately but was weak, ie when asked if she was all right stated "not really", continued on floor for about one minute, pt now alert, oriented with slight headache, [redacted] weeks pregnant, denies abd pain or vag bleeding

## 2015-07-30 NOTE — ED Notes (Signed)
NAD noted at this time. Pt refused wheelchair to the lobby. Pt denies comments/concerns at this time. 

## 2015-07-30 NOTE — Discharge Instructions (Signed)

## 2015-07-30 NOTE — ED Notes (Signed)
MD at bedside at this time.

## 2015-07-30 NOTE — ED Provider Notes (Signed)
[facility] Emergency Department Provider Note     Time seen: ----------------------------------------- 4:10 PM on 07/30/2015 -----------------------------------------    I have reviewed the triage vital signs and the nursing notes.   HISTORY  Chief Complaint Near Syncope    HPI Cynthia Crawford is a 24 y.o. female who presents to ER fornear syncopal event. Patient states she dropped her phone from a standing position bent over to pick it up and immediately began to feel weak and had to sit down almost passed out. Friend with her states she did not pass out but didn't hit her head and she fell, denies any headache now. She is currently [redacted] weeks pregnant, denies any vaginal bleeding or leakage of fluid. States she has been eating and drinking normally.   Past Medical History  Diagnosis Date  . Asthma   . Anxiety     Patient Active Problem List   Diagnosis Date Noted  . Supervision of normal first pregnancy in first trimester 06/27/2015  . Contraception management 09/25/2011  . Migraine 09/25/2011    Past Surgical History  Procedure Laterality Date  . Tonsillectomy      Allergies Penicillins and Sulfa antibiotics  Social History Social History  Substance Use Topics  . Smoking status: Never Smoker   . Smokeless tobacco: Never Used  . Alcohol Use: No    Review of Systems Constitutional: Negative for fever. Eyes: Negative for visual changes. ENT: Negative for sore throat. Cardiovascular: Negative for chest pain. Respiratory: Negative for shortness of breath. Gastrointestinal: Negative for abdominal pain, vomiting and diarrhea. Genitourinary: Negative for dysuria. Musculoskeletal: Negative for back pain. Skin: Negative for rash. Neurological: Negative for headaches, focal weakness or numbness.  10-point ROS otherwise negative.  ____________________________________________   PHYSICAL EXAM:  VITAL SIGNS: ED Triage Vitals   Enc Vitals Group     BP 07/30/15 1505 148/83 mmHg     Pulse Rate 07/30/15 1505 94     Resp 07/30/15 1505 18     Temp 07/30/15 1505 98.2 F (36.8 C)     Temp Source 07/30/15 1505 Oral     SpO2 07/30/15 1505 100 %     Weight 07/30/15 1505 128 lb (58.06 kg)     Height 07/30/15 1505 5\' 3"  (1.6 m)     Head Cir --      Peak Flow --      Pain Score 07/30/15 1507 3     Pain Loc --      Pain Edu? --      Excl. in GC? --     Constitutional: Alert and oriented. Well appearing and in no distress. Eyes: Conjunctivae are normal. PERRL. Normal extraocular movements. ENT   Head: Normocephalic and atraumatic.   Nose: No congestion/rhinnorhea.   Mouth/Throat: Mucous membranes are moist.   Neck: No stridor. Cardiovascular: Normal rate, regular rhythm. Normal and symmetric distal pulses are present in all extremities. No murmurs, rubs, or gallops. Respiratory: Normal respiratory effort without tachypnea nor retractions. Breath sounds are clear and equal bilaterally. No wheezes/rales/rhonchi. Gastrointestinal: Soft and nontender. No distention. No abdominal bruits.  Musculoskeletal: Nontender with normal range of motion in all extremities. No joint effusions.  No lower extremity tenderness nor edema. Neurologic:  Normal speech and language. No gross focal neurologic deficits are appreciated. Speech is normal. No gait instability. Skin:  Skin is warm, dry and intact. No rash noted. Psychiatric: Mood and affect are normal. Speech and behavior are normal. Patient exhibits appropriate insight and judgment.  ____________________________________________  EKG: Interpreted by me. Normal sinus rhythm with a rate of 82 bpm, normal PR interval, normal QS with, normal QT interval. Nonspecific T-wave changes.  ____________________________________________  ED COURSE:  Pertinent labs & imaging results that were available during my care of the patient were reviewed by me and considered in my medical  decision making (see chart for details). Patient was not orthostatic when checked by me. Blood pressure actually increased upon standing. She did not feel weak or lightheaded with standing. ____________________________________________    LABS (pertinent positives/negatives)  Labs Reviewed  URINALYSIS COMPLETEWITH MICROSCOPIC ([facility] ONLY) - Abnormal; Notable for the following:    Color, Urine YELLOW (*)    APPearance HAZY (*)    Protein, ur 30 (*)    Bacteria, UA RARE (*)    Squamous Epithelial / LPF 0-5 (*)    All other components within normal limits  COMPREHENSIVE METABOLIC PANEL - Abnormal; Notable for the following:    Sodium 134 (*)    Alkaline Phosphatase 36 (*)    All other components within normal limits  HCG, QUANTITATIVE, PREGNANCY - Abnormal; Notable for the following:    hCG, Beta Chain, Quant, S [id number] (*)    All other components within normal limits  CBC  TROPONIN I   ____________________________________________  FINAL ASSESSMENT AND PLAN  Near-syncope  Plan: Patient with labs as dictated above. Patient likely was near-syncope related to physiologic changes associated with pregnancy. Bedside ultrasound is unremarkable. HCG and labs here are normal. She is not orthostatic, stable for outpatient follow-up.   Emily Filbert, MD   Emily Filbert, MD 07/30/15 [phone]

## 2015-08-01 ENCOUNTER — Encounter: Payer: Self-pay | Admitting: Family Medicine

## 2015-08-01 ENCOUNTER — Ambulatory Visit (INDEPENDENT_AMBULATORY_CARE_PROVIDER_SITE_OTHER): Payer: BLUE CROSS/BLUE SHIELD | Admitting: Family Medicine

## 2015-08-01 VITALS — BP 148/84 | HR 85 | Temp 98.2°F | Resp 16 | Wt 135.2 lb

## 2015-08-01 DIAGNOSIS — Z3490 Encounter for supervision of normal pregnancy, unspecified, unspecified trimester: Secondary | ICD-10-CM

## 2015-08-01 DIAGNOSIS — R55 Syncope and collapse: Secondary | ICD-10-CM | POA: Diagnosis not present

## 2015-08-01 DIAGNOSIS — Z331 Pregnant state, incidental: Secondary | ICD-10-CM | POA: Diagnosis not present

## 2015-08-01 NOTE — Progress Notes (Signed)
Patient ID: Shai Rasmussen, female   DOB: 29-Aug-1991, 24 y.o.   MRN: [id number] Name: Cynthia Crawford   MRN: [id number]    DOB: [birth year]   Date:08/01/2015       Progress Note  Subjective  Chief Complaint  Chief Complaint  Patient presents with  . Hospitalization Follow-up  . Loss of Consciousness   HPI Was eating lunch with family Saturday 07-30-15 and dropped her phone. When she stood up quickly she fainted and hit the table before landing on the floor. She was conscious within a minute and was take to the ER. Labs showed slight drop in sodium but no sign of infection or anemia. Presently she is [redacted] weeks pregnant and taking her prenatal vitamins daily. Some dull headache remains when she stands up. Goes away when lying down.  Past Medical History  Diagnosis Date  . Asthma   . Anxiety     Social History  Substance Use Topics  . Smoking status: Never Smoker   . Smokeless tobacco: Never Used  . Alcohol Use: No     Current outpatient prescriptions:  .  Prenatal Vit-Fe Fumarate-FA (MULTIVITAMIN-PRENATAL) 27-0.8 MG TABS tablet, Take 1 tablet by mouth daily at 12 noon., Disp: , Rfl:  .  promethazine (PHENERGAN) 25 MG tablet, Take 1 tablet (25 mg total) by mouth every 6 (six) hours as needed for nausea or vomiting., Disp: 30 tablet, Rfl: 2  Allergies  Allergen Reactions  . Penicillins Hives    Has patient had a PCN reaction causing immediate rash, facial/tongue/throat swelling, SOB or lightheadedness with hypotension: Yes Has patient had a PCN reaction causing severe rash involving mucus membranes or skin necrosis: No Has patient had a PCN reaction that required hospitalization No Has patient had a PCN reaction occurring within the last 10 years: No If all of the above answers are "NO", then may proceed with Cephalosporin use.  . Sulfa Antibiotics Hives    Review of Systems  Constitutional: Negative.   HENT: Negative.   Eyes: Negative.   Respiratory:  Negative.   Cardiovascular: Negative.   Gastrointestinal: Positive for nausea.  Genitourinary: Negative.   Musculoskeletal: Negative.   Skin: Negative.   Neurological: Negative.   Endo/Heme/Allergies: Negative.   Psychiatric/Behavioral: Negative.    Objective  Filed Vitals:   08/01/15 1002  BP: 148/84 sitting settled to 132/72 when sitting or lying down - 120/68 when standing.  Pulse: 85  Temp: 98.2 F (36.8 C)  TempSrc: Oral  Resp: 16  Weight: 135 lb 3.2 oz (61.326 kg)  SpO2: 95%   Physical Exam  Constitutional: She is oriented to person, place, and time.  HENT:  Head: Normocephalic and atraumatic.  Eyes: Conjunctivae and EOM are normal.  Neck: Normal range of motion. Neck supple.  Cardiovascular: Normal rate, regular rhythm, normal heart sounds and intact distal pulses.   Abdominal: Soft. Bowel sounds are normal.  Neurological: She is alert and oriented to person, place, and time.  Skin: Skin is warm and dry.  Psychiatric: Mood, memory, affect and judgment normal.   Recent Results (from the past 2160 hour(s))  Cytology - PAP     Status: None   Collection Time: 06/27/15 12:00 AM  Result Value Ref Range   CYTOLOGY - PAP PAP RESULT   Culture, OB Urine     Status: None   Collection Time: 06/27/15  4:01 PM  Result Value Ref Range   Colony Count NO GROWTH    Organism ID, Bacteria NO GROWTH  Prenatal Profile     Status: Abnormal   Collection Time: 06/27/15  4:01 PM  Result Value Ref Range   WBC 9.0 4.0 - 10.5 K/uL   RBC 3.94 3.87 - 5.11 MIL/uL   Hemoglobin 11.7 (L) 12.0 - 15.0 g/dL   HCT 34.7 (L) 36.0 - 46.0 %   MCV 88.1 78.0 - 100.0 fL   MCH 29.7 26.0 - 34.0 pg   MCHC 33.7 30.0 - 36.0 g/dL   RDW 13.1 11.5 - 15.5 %   Platelets 195 150 - 400 K/uL   MPV 11.6 8.6 - 12.4 fL   Neutrophils Relative % 67 43 - 77 %   Neutro Abs 6.0 1.7 - 7.7 K/uL   Lymphocytes Relative 23 12 - 46 %   Lymphs Abs 2.1 0.7 - 4.0 K/uL   Monocytes Relative 9 3 - 12 %   Monocytes Absolute  0.8 0.1 - 1.0 K/uL   Eosinophils Relative 1 0 - 5 %   Eosinophils Absolute 0.1 0.0 - 0.7 K/uL   Basophils Relative 0 0 - 1 %   Basophils Absolute 0.0 0.0 - 0.1 K/uL   Smear Review Criteria for review not met    Hepatitis B Surface Ag NEGATIVE NEGATIVE   HIV 1&2 Ab, 4th Generation NONREACTIVE NONREACTIVE    Comment:   HIV-1 antigen and HIV-1/HIV-2 antibodies were not detected.  There is no laboratory evidence of HIV infection.   HIV-1/2 Antibody Diff        Not indicated. HIV-1 RNA, Qual TMA          Not indicated.      RPR Ser Ql NON REAC NON REAC   Rubella 3.36 (H) <0.90 Index    Comment:   Reference Range:        <0.90 Index = Not Immune                     0.90-0.99 Index = Equivocal                        >=1.00 Index = Immune      ABO Grouping O    Rh Type POS    Antibody Screen NEG NEGATIVE    Comment:   PLEASE NOTE: This information has been disclosed to you from records whose confidentiality may be protected by state law. If your state requires such protection, then the state law prohibits you from making any further disclosure of the information without the specific written consent of the person to whom it pertains, or as otherwise permitted by law. A general authorization for the release of medical or other information is NOT sufficient for this purpose.   The performance of this assay has not been clinically validated in patients less than 22 years old.   For additional information please refer to http://education.quest[employer].com/faq/FAQ106.  (This link is being provided for informational/educational purposes only.)     CBC     Status: None   Collection Time: 07/30/15  3:16 PM  Result Value Ref Range   WBC 9.6 3.6 - 11.0 K/uL   RBC 4.17 3.80 - 5.20 MIL/uL   Hemoglobin 12.4 12.0 - 16.0 g/dL   HCT 37.6 35.0 - 47.0 %   MCV 90.2 80.0 - 100.0 fL   MCH 29.7 26.0 - 34.0 pg   MCHC 32.9 32.0 - 36.0 g/dL   RDW 13.0 11.5 - 14.5 %   Platelets 150 150 - 440  K/uL  Urinalysis complete, with microscopic ([facility] only)     Status: Abnormal   Collection Time: 07/30/15  3:16 PM  Result Value Ref Range   Color, Urine YELLOW (A) YELLOW   APPearance HAZY (A) CLEAR   Glucose, UA NEGATIVE NEGATIVE mg/dL   Bilirubin Urine NEGATIVE NEGATIVE   Ketones, ur NEGATIVE NEGATIVE mg/dL   Specific Gravity, Urine 1.020 1.005 - 1.030   Hgb urine dipstick NEGATIVE NEGATIVE   pH 6.0 5.0 - 8.0   Protein, ur 30 (A) NEGATIVE mg/dL   Nitrite NEGATIVE NEGATIVE   Leukocytes, UA NEGATIVE NEGATIVE   RBC / HPF 0-5 0 - 5 RBC/hpf   WBC, UA 0-5 0 - 5 WBC/hpf   Bacteria, UA RARE (A) NONE SEEN   Squamous Epithelial / LPF 0-5 (A) NONE SEEN   Mucous PRESENT   Comprehensive metabolic panel     Status: Abnormal   Collection Time: 07/30/15  3:16 PM  Result Value Ref Range   Sodium 134 (L) 135 - 145 mmol/L   Potassium 3.8 3.5 - 5.1 mmol/L   Chloride 105 101 - 111 mmol/L   CO2 23 22 - 32 mmol/L   Glucose, Bld 84 65 - 99 mg/dL   BUN 8 6 - 20 mg/dL   Creatinine, Ser 0.61 0.44 - 1.00 mg/dL   Calcium 9.1 8.9 - 10.3 mg/dL   Total Protein 7.1 6.5 - 8.1 g/dL   Albumin 3.7 3.5 - 5.0 g/dL   AST 22 15 - 41 U/L   ALT 20 14 - 54 U/L   Alkaline Phosphatase 36 (L) 38 - 126 U/L   Total Bilirubin 0.3 0.3 - 1.2 mg/dL   GFR calc non Af Amer >60 >60 mL/min   GFR calc Af Amer >60 >60 mL/min    Comment: (NOTE) The eGFR has been calculated using the CKD EPI equation. This calculation has not been validated in all clinical situations. eGFR's persistently <60 mL/min signify possible Chronic Kidney Disease.    Anion gap 6 5 - 15  hCG, quantitative, pregnancy     Status: Abnormal   Collection Time: 07/30/15  3:16 PM  Result Value Ref Range   hCG, Beta Chain, Quant, S 83351 (H) <5 mIU/mL    Comment:          GEST. AGE      CONC.  (mIU/mL)   <=1 WEEK        5 - 50     2 WEEKS       50 - 500     3 WEEKS       100 - 10,000     4 WEEKS     1,000 - 30,000     5 WEEKS     3,500 - 115,000   6-8  WEEKS     12,000 - 270,000    12 WEEKS     15,000 - 220,000        FEMALE AND NON-PREGNANT FEMALE:     LESS THAN 5 mIU/mL   Troponin I     Status: None   Collection Time: 07/30/15  3:16 PM  Result Value Ref Range   Troponin I <0.03 <0.031 ng/mL    Comment:        NO INDICATION OF MYOCARDIAL INJURY.      Assessment & Plan  1. Syncope and collapse Short episode on 07-30-15 suspected orthostatic hypotension. Some dull headache remains. Feels better with increase in hydration yesterday. Advised to continue to watch hydration level  with pregnancy and recheck in 3-4 days if any further light headed sensations.   2. Pregnancy States she is 13 weeks into her first pregnancy. Advised make her OBS aware of this episode of fainting (Dr. Hulan Fray at the [facility] in [place], [place]). No previous episodes or history of seizures. Encouraged to eat 3 meals a day and hydrate well. Maintain routine follow up with obstetrician.

## 2015-08-01 NOTE — Patient Instructions (Signed)

## 2015-09-15 ENCOUNTER — Other Ambulatory Visit: Payer: Self-pay | Admitting: Obstetrics and Gynecology

## 2015-09-15 ENCOUNTER — Ambulatory Visit (HOSPITAL_COMMUNITY)
Admission: RE | Admit: 2015-09-15 | Discharge: 2015-09-15 | Disposition: A | Discharge status: Home / Self Care | Payer: BLUE CROSS/BLUE SHIELD | Source: Ambulatory Visit | Attending: Obstetrics and Gynecology | Admitting: Obstetrics and Gynecology

## 2015-09-15 DIAGNOSIS — Z3689 Encounter for other specified antenatal screening: Secondary | ICD-10-CM

## 2015-09-15 DIAGNOSIS — Z36 Encounter for antenatal screening of mother: Secondary | ICD-10-CM | POA: Diagnosis not present

## 2015-09-15 DIAGNOSIS — Z3A19 19 weeks gestation of pregnancy: Secondary | ICD-10-CM

## 2015-09-15 DIAGNOSIS — Z3401 Encounter for supervision of normal first pregnancy, first trimester: Secondary | ICD-10-CM

## 2015-09-18 NOTE — L&D Delivery Note (Signed)
Delivery Note At 06:13, a viable female was delivered via NSVD (Occiput Posterior)  APGAR: 8, 9; weight 2651g.   Placenta status: intact, spontaneous.  Cord: 3-vessel with the following complications: loose nuchal X 1, infant delivered through.  Cord pH: not obtained  Anesthesia: epidural, lidocaine locally for repair Episiotomy: none Lacerations: 1st degree perineal Suture Repair: 3.0 vicryl Est. Blood Loss (mL): 60cc  Mom to postpartum.  Baby to Couplet care / Skin to Skin.  Caesar ChestnutKelly E Garcia 01/18/2016, 6:39 AM  OB fellow attestation: Patient is a G1P1001 at 3017w2d who was admitted for IOL due to gestational HTN.  She progressed with augmentation via FB, Cytotec, AROM and Pitocin.  I was gloved and present for delivery in its entirety.  Second stage of labor progressed and the patient pushed for 2+ hours with good descent. Variable decels during second stage noted and there was loose nuchal present at delivery that was delivered through. Infant delivered direct OP.   Complications: none  Lacerations: 1st degree, repaired in the typical fashion  EBL: 60cc  Federico FlakeKimberly Niles Jalynn Betzold, MD 10:13 AM

## 2015-09-19 ENCOUNTER — Encounter: Payer: Self-pay | Admitting: Family Medicine

## 2015-09-19 ENCOUNTER — Ambulatory Visit (INDEPENDENT_AMBULATORY_CARE_PROVIDER_SITE_OTHER): Payer: BLUE CROSS/BLUE SHIELD | Admitting: Family Medicine

## 2015-09-19 VITALS — BP 131/79 | HR 68 | Wt 141.0 lb

## 2015-09-19 DIAGNOSIS — Z3401 Encounter for supervision of normal first pregnancy, first trimester: Secondary | ICD-10-CM

## 2015-09-19 NOTE — Patient Instructions (Signed)
Second Trimester of Pregnancy The second trimester is from week 13 through week 28, months 4 through 6. The second trimester is often a time when you feel your best. Your body has also adjusted to being pregnant, and you begin to feel better physically. Usually, morning sickness has lessened or quit completely, you may have more energy, and you may have an increase in appetite. The second trimester is also a time when the fetus is growing rapidly. At the end of the sixth month, the fetus is about 9 inches long and weighs about 1 pounds. You will likely begin to feel the baby move (quickening) between 18 and 20 weeks of the pregnancy. BODY CHANGES Your body goes through many changes during pregnancy. The changes vary from woman to woman.   Your weight will continue to increase. You will notice your lower abdomen bulging out.  You may begin to get stretch marks on your hips, abdomen, and breasts.  You may develop headaches that can be relieved by medicines approved by your health care provider.  You may urinate more often because the fetus is pressing on your bladder.  You may develop or continue to have heartburn as a result of your pregnancy.  You may develop constipation because certain hormones are causing the muscles that push waste through your intestines to slow down.  You may develop hemorrhoids or swollen, bulging veins (varicose veins).  You may have back pain because of the weight gain and pregnancy hormones relaxing your joints between the bones in your pelvis and as a result of a shift in weight and the muscles that support your balance.  Your breasts will continue to grow and be tender.  Your gums may bleed and may be sensitive to brushing and flossing.  Dark spots or blotches (chloasma, mask of pregnancy) may develop on your face. This will likely fade after the baby is born.  A dark line from your belly button to the pubic area (linea nigra) may appear. This will likely  fade after the baby is born.  You may have changes in your hair. These can include thickening of your hair, rapid growth, and changes in texture. Some women also have hair loss during or after pregnancy, or hair that feels dry or thin. Your hair will most likely return to normal after your baby is born. WHAT TO EXPECT AT YOUR PRENATAL VISITS During a routine prenatal visit:  You will be weighed to make sure you and the fetus are growing normally.  Your blood pressure will be taken.  Your abdomen will be measured to track your baby's growth.  The fetal heartbeat will be listened to.  Any test results from the previous visit will be discussed. Your health care provider may ask you:  How you are feeling.  If you are feeling the baby move.  If you have had any abnormal symptoms, such as leaking fluid, bleeding, severe headaches, or abdominal cramping.  If you are using any tobacco products, including cigarettes, chewing tobacco, and electronic cigarettes.  If you have any questions. Other tests that may be performed during your second trimester include:  Blood tests that check for:  Low iron levels (anemia).  Gestational diabetes (between 24 and 28 weeks).  Rh antibodies.  Urine tests to check for infections, diabetes, or protein in the urine.  An ultrasound to confirm the proper growth and development of the baby.  An amniocentesis to check for possible genetic problems.  Fetal screens for spina bifida   and Down syndrome.  HIV (human immunodeficiency virus) testing. Routine prenatal testing includes screening for HIV, unless you choose not to have this test. HOME CARE INSTRUCTIONS   Avoid all smoking, herbs, alcohol, and unprescribed drugs. These chemicals affect the formation and growth of the baby.  Do not use any tobacco products, including cigarettes, chewing tobacco, and electronic cigarettes. If you need help quitting, ask your health care provider. You may receive  counseling support and other resources to help you quit.  Follow your health care provider's instructions regarding medicine use. There are medicines that are either safe or unsafe to take during pregnancy.  Exercise only as directed by your health care provider. Experiencing uterine cramps is a good sign to stop exercising.  Continue to eat regular, healthy meals.  Wear a good support bra for breast tenderness.  Do not use hot tubs, steam rooms, or saunas.  Wear your seat belt at all times when driving.  Avoid raw meat, uncooked cheese, cat litter boxes, and soil used by cats. These carry germs that can cause birth defects in the baby.  Take your prenatal vitamins.  Take 1500-2000 mg of calcium daily starting at the 20th week of pregnancy until you deliver your baby.  Try taking a stool softener (if your health care provider approves) if you develop constipation. Eat more high-fiber foods, such as fresh vegetables or fruit and whole grains. Drink plenty of fluids to keep your urine clear or pale yellow.  Take warm sitz baths to soothe any pain or discomfort caused by hemorrhoids. Use hemorrhoid cream if your health care provider approves.  If you develop varicose veins, wear support hose. Elevate your feet for 15 minutes, 3-4 times a day. Limit salt in your diet.  Avoid heavy lifting, wear low heel shoes, and practice good posture.  Rest with your legs elevated if you have leg cramps or low back pain.  Visit your dentist if you have not gone yet during your pregnancy. Use a soft toothbrush to brush your teeth and be gentle when you floss.  A sexual relationship may be continued unless your health care provider directs you otherwise.  Continue to go to all your prenatal visits as directed by your health care provider. SEEK MEDICAL CARE IF:   You have dizziness.  You have mild pelvic cramps, pelvic pressure, or nagging pain in the abdominal area.  You have persistent nausea,  vomiting, or diarrhea.  You have a bad smelling vaginal discharge.  You have pain with urination. SEEK IMMEDIATE MEDICAL CARE IF:   You have a fever.  You are leaking fluid from your vagina.  You have spotting or bleeding from your vagina.  You have severe abdominal cramping or pain.  You have rapid weight gain or loss.  You have shortness of breath with chest pain.  You notice sudden or extreme swelling of your face, hands, ankles, feet, or legs.  You have not felt your baby move in over an hour.  You have severe headaches that do not go away with medicine.  You have vision changes.   This information is not intended to replace advice given to you by your health care provider. Make sure you discuss any questions you have with your health care provider.   Document Released: 08/28/2001 Document Revised: 09/24/2014 Document Reviewed: 11/04/2012 Elsevier Interactive Patient Education 2016 [employer]   Breastfeeding Deciding to breastfeed is one of the best choices you can make for you and your baby. A change   in hormones during pregnancy causes your breast tissue to grow and increases the number and size of your milk ducts. These hormones also allow proteins, sugars, and fats from your blood supply to make breast milk in your milk-producing glands. Hormones prevent breast milk from being released before your baby is born as well as prompt milk flow after birth. Once breastfeeding has begun, thoughts of your baby, as well as his or her sucking or crying, can stimulate the release of milk from your milk-producing glands.  BENEFITS OF BREASTFEEDING For Your Baby  Your first milk (colostrum) helps your baby's digestive system function better.  There are antibodies in your milk that help your baby fight off infections.  Your baby has a lower incidence of asthma, allergies, and sudden infant death syndrome.  The nutrients in breast milk are better for your baby than infant  formulas and are designed uniquely for your baby's needs.  Breast milk improves your baby's brain development.  Your baby is less likely to develop other conditions, such as childhood obesity, asthma, or type 2 diabetes mellitus. For You  Breastfeeding helps to create a very special bond between you and your baby.  Breastfeeding is convenient. Breast milk is always available at the correct temperature and costs nothing.  Breastfeeding helps to burn calories and helps you lose the weight gained during pregnancy.  Breastfeeding makes your uterus contract to its prepregnancy size faster and slows bleeding (lochia) after you give birth.   Breastfeeding helps to lower your risk of developing type 2 diabetes mellitus, osteoporosis, and breast or ovarian cancer later in life. SIGNS THAT YOUR BABY IS HUNGRY Early Signs of Hunger  Increased alertness or activity.  Stretching.  Movement of the head from side to side.  Movement of the head and opening of the mouth when the corner of the mouth or cheek is stroked (rooting).  Increased sucking sounds, smacking lips, cooing, sighing, or squeaking.  Hand-to-mouth movements.  Increased sucking of fingers or hands. Late Signs of Hunger  Fussing.  Intermittent crying. Extreme Signs of Hunger Signs of extreme hunger will require calming and consoling before your baby will be able to breastfeed successfully. Do not wait for the following signs of extreme hunger to occur before you initiate breastfeeding:  Restlessness.  A loud, strong cry.  Screaming. BREASTFEEDING BASICS Breastfeeding Initiation  Find a comfortable place to sit or lie down, with your neck and back well supported.  Place a pillow or rolled up blanket under your baby to bring him or her to the level of your breast (if you are seated). Nursing pillows are specially designed to help support your arms and your baby while you breastfeed.  Make sure that your baby's  abdomen is facing your abdomen.  Gently massage your breast. With your fingertips, massage from your chest wall toward your nipple in a circular motion. This encourages milk flow. You may need to continue this action during the feeding if your milk flows slowly.  Support your breast with 4 fingers underneath and your thumb above your nipple. Make sure your fingers are well away from your nipple and your baby's mouth.  Stroke your baby's lips gently with your finger or nipple.  When your baby's mouth is open wide enough, quickly bring your baby to your breast, placing your entire nipple and as much of the colored area around your nipple (areola) as possible into your baby's mouth.  More areola should be visible above your baby's upper lip than   below the lower lip.  Your baby's tongue should be between his or her lower gum and your breast.  Ensure that your baby's mouth is correctly positioned around your nipple (latched). Your baby's lips should create a seal on your breast and be turned out (everted).  It is common for your baby to suck about 2-3 minutes in order to start the flow of breast milk. Latching Teaching your baby how to latch on to your breast properly is very important. An improper latch can cause nipple pain and decreased milk supply for you and poor weight gain in your baby. Also, if your baby is not latched onto your nipple properly, he or she may swallow some air during feeding. This can make your baby fussy. Burping your baby when you switch breasts during the feeding can help to get rid of the air. However, teaching your baby to latch on properly is still the best way to prevent fussiness from swallowing air while breastfeeding. Signs that your baby has successfully latched on to your nipple:  Silent tugging or silent sucking, without causing you pain.  Swallowing heard between every 3-4 sucks.  Muscle movement above and in front of his or her ears while sucking. Signs  that your baby has not successfully latched on to nipple:  Sucking sounds or smacking sounds from your baby while breastfeeding.  Nipple pain. If you think your baby has not latched on correctly, slip your finger into the corner of your baby's mouth to break the suction and place it between your baby's gums. Attempt breastfeeding initiation again. Signs of Successful Breastfeeding Signs from your baby:  A gradual decrease in the number of sucks or complete cessation of sucking.  Falling asleep.  Relaxation of his or her body.  Retention of a small amount of milk in his or her mouth.  Letting go of your breast by himself or herself. Signs from you:  Breasts that have increased in firmness, weight, and size 1-3 hours after feeding.  Breasts that are softer immediately after breastfeeding.  Increased milk volume, as well as a change in milk consistency and color by the fifth day of breastfeeding.  Nipples that are not sore, cracked, or bleeding. Signs That Your Baby is Getting Enough Milk  Wetting at least 3 diapers in a 24-hour period. The urine should be clear and pale yellow by age 5 days.  At least 3 stools in a 24-hour period by age 5 days. The stool should be soft and yellow.  At least 3 stools in a 24-hour period by age 7 days. The stool should be seedy and yellow.  No loss of weight greater than 10% of birth weight during the first 3 days of age.  Average weight gain of 4-7 ounces (113-198 g) per week after age 4 days.  Consistent daily weight gain by age 5 days, without weight loss after the age of 2 weeks. After a feeding, your baby may spit up a small amount. This is common. BREASTFEEDING FREQUENCY AND DURATION Frequent feeding will help you make more milk and can prevent sore nipples and breast engorgement. Breastfeed when you feel the need to reduce the fullness of your breasts or when your baby shows signs of hunger. This is called "breastfeeding on demand." Avoid  introducing a pacifier to your baby while you are working to establish breastfeeding (the first 4-6 weeks after your baby is born). After this time you may choose to use a pacifier. Research has shown that   pacifier use during the first year of a baby's life decreases the risk of sudden infant death syndrome (SIDS). Allow your baby to feed on each breast as long as he or she wants. Breastfeed until your baby is finished feeding. When your baby unlatches or falls asleep while feeding from the first breast, offer the second breast. Because newborns are often sleepy in the first few weeks of life, you may need to awaken your baby to get him or her to feed. Breastfeeding times will vary from baby to baby. However, the following rules can serve as a guide to help you ensure that your baby is properly fed:  Newborns (babies 4 weeks of age or younger) may breastfeed every 1-3 hours.  Newborns should not go longer than 3 hours during the day or 5 hours during the night without breastfeeding.  You should breastfeed your baby a minimum of 8 times in a 24-hour period until you begin to introduce solid foods to your baby at around 6 months of age. BREAST MILK PUMPING Pumping and storing breast milk allows you to ensure that your baby is exclusively fed your breast milk, even at times when you are unable to breastfeed. This is especially important if you are going back to work while you are still breastfeeding or when you are not able to be present during feedings. Your lactation consultant can give you guidelines on how long it is safe to store breast milk. A breast pump is a machine that allows you to pump milk from your breast into a sterile bottle. The pumped breast milk can then be stored in a refrigerator or freezer. Some breast pumps are operated by hand, while others use electricity. Ask your lactation consultant which type will work best for you. Breast pumps can be purchased, but some hospitals and  breastfeeding support groups lease breast pumps on a monthly basis. A lactation consultant can teach you how to hand express breast milk, if you prefer not to use a pump. CARING FOR YOUR BREASTS WHILE YOU BREASTFEED Nipples can become dry, cracked, and sore while breastfeeding. The following recommendations can help keep your breasts moisturized and healthy:  Avoid using soap on your nipples.  Wear a supportive bra. Although not required, special nursing bras and tank tops are designed to allow access to your breasts for breastfeeding without taking off your entire bra or top. Avoid wearing underwire-style bras or extremely tight bras.  Air dry your nipples for 3-4minutes after each feeding.  Use only cotton bra pads to absorb leaked breast milk. Leaking of breast milk between feedings is normal.  Use lanolin on your nipples after breastfeeding. Lanolin helps to maintain your skin's normal moisture barrier. If you use pure lanolin, you do not need to wash it off before feeding your baby again. Pure lanolin is not toxic to your baby. You may also hand express a few drops of breast milk and gently massage that milk into your nipples and allow the milk to air dry. In the first few weeks after giving birth, some women experience extremely full breasts (engorgement). Engorgement can make your breasts feel heavy, warm, and tender to the touch. Engorgement peaks within 3-5 days after you give birth. The following recommendations can help ease engorgement:  Completely empty your breasts while breastfeeding or pumping. You may want to start by applying warm, moist heat (in the shower or with warm water-soaked hand towels) just before feeding or pumping. This increases circulation and helps the milk   flow. If your baby does not completely empty your breasts while breastfeeding, pump any extra milk after he or she is finished.  Wear a snug bra (nursing or regular) or tank top for 1-2 days to signal your body  to slightly decrease milk production.  Apply ice packs to your breasts, unless this is too uncomfortable for you.  Make sure that your baby is latched on and positioned properly while breastfeeding. If engorgement persists after 48 hours of following these recommendations, contact your health care provider or a lactation consultant. OVERALL HEALTH CARE RECOMMENDATIONS WHILE BREASTFEEDING  Eat healthy foods. Alternate between meals and snacks, eating 3 of each per day. Because what you eat affects your breast milk, some of the foods may make your baby more irritable than usual. Avoid eating these foods if you are sure that they are negatively affecting your baby.  Drink milk, fruit juice, and water to satisfy your thirst (about 10 glasses a day).  Rest often, relax, and continue to take your prenatal vitamins to prevent fatigue, stress, and anemia.  Continue breast self-awareness checks.  Avoid chewing and smoking tobacco. Chemicals from cigarettes that pass into breast milk and exposure to secondhand smoke may harm your baby.  Avoid alcohol and drug use, including marijuana. Some medicines that may be harmful to your baby can pass through breast milk. It is important to ask your health care provider before taking any medicine, including all over-the-counter and prescription medicine as well as vitamin and herbal supplements. It is possible to become pregnant while breastfeeding. If birth control is desired, ask your health care provider about options that will be safe for your baby. SEEK MEDICAL CARE IF:  You feel like you want to stop breastfeeding or have become frustrated with breastfeeding.  You have painful breasts or nipples.  Your nipples are cracked or bleeding.  Your breasts are red, tender, or warm.  You have a swollen area on either breast.  You have a fever or chills.  You have nausea or vomiting.  You have drainage other than breast milk from your nipples.  Your  breasts do not become full before feedings by the fifth day after you give birth.  You feel sad and depressed.  Your baby is too sleepy to eat well.  Your baby is having trouble sleeping.   Your baby is wetting less than 3 diapers in a 24-hour period.  Your baby has less than 3 stools in a 24-hour period.  Your baby's skin or the white part of his or her eyes becomes yellow.   Your baby is not gaining weight by 5 days of age. SEEK IMMEDIATE MEDICAL CARE IF:  Your baby is overly tired (lethargic) and does not want to wake up and feed.  Your baby develops an unexplained fever.   This information is not intended to replace advice given to you by your health care provider. Make sure you discuss any questions you have with your health care provider.   Document Released: 09/03/2005 Document Revised: 05/25/2015 Document Reviewed: 02/25/2013 Elsevier Interactive Patient Education 2016 [employer]  

## 2015-09-19 NOTE — Progress Notes (Signed)
Subjective:  Cynthia Crawford is a 25 y.o. G1P0000 at 6471w0d being seen today for ongoing prenatal care.  She is currently monitored for the following issues for this low-risk pregnancy and has Migraine and Supervision of normal first pregnancy in first trimester on her problem list.  Patient reports no complaints.  Contractions: Not present. Vag. Bleeding: None.  Movement: Present. Denies leaking of fluid.   The following portions of the patient's history were reviewed and updated as appropriate: allergies, current medications, past family history, past medical history, past social history, past surgical history and problem list. Problem list updated.  Objective:   Filed Vitals:   09/19/15 1316  BP: 131/79  Pulse: 68  Weight: 141 lb (63.957 kg)    Fetal Status: Fetal Heart Rate (bpm): 160 Fundal Height: 20 cm Movement: Present     General:  Alert, oriented and cooperative. Patient is in no acute distress.  Skin: Skin is warm and dry. No rash noted.   Cardiovascular: Normal heart rate noted  Respiratory: Normal respiratory effort, no problems with respiration noted  Abdomen: Soft, gravid, appropriate for gestational age. Pain/Pressure: Absent     Pelvic: Vag. Bleeding: None Vag D/C Character: Thin   Cervical exam deferred        Extremities: Normal range of motion.  Edema: None  Mental Status: Normal mood and affect. Normal behavior. Normal judgment and thought content.   Urinalysis: Urine Protein: Trace Urine Glucose: Negative  Assessment and Plan:  Pregnancy: G1P0000 at 4271w0d  1. Supervision of normal first pregnancy in second trimester Continue routine prenatal care. Declines Quad screen  Preterm labor symptoms and general obstetric precautions including but not limited to vaginal bleeding, contractions, leaking of fluid and fetal movement were reviewed in detail with the patient. Please refer to After Visit Summary for other counseling recommendations.  Return in  about 8 weeks (around 11/14/2015).   Reva Boresanya S Chee Kinslow, MD

## 2015-11-14 ENCOUNTER — Encounter: Payer: Self-pay | Admitting: Obstetrics and Gynecology

## 2015-11-14 ENCOUNTER — Ambulatory Visit (INDEPENDENT_AMBULATORY_CARE_PROVIDER_SITE_OTHER): Payer: BLUE CROSS/BLUE SHIELD | Admitting: Obstetrics and Gynecology

## 2015-11-14 VITALS — BP 133/87 | HR 92 | Wt 154.0 lb

## 2015-11-14 DIAGNOSIS — Z36 Encounter for antenatal screening of mother: Secondary | ICD-10-CM | POA: Diagnosis not present

## 2015-11-14 DIAGNOSIS — Z3402 Encounter for supervision of normal first pregnancy, second trimester: Secondary | ICD-10-CM

## 2015-11-14 DIAGNOSIS — Z23 Encounter for immunization: Secondary | ICD-10-CM

## 2015-11-14 DIAGNOSIS — Z3401 Encounter for supervision of normal first pregnancy, first trimester: Secondary | ICD-10-CM

## 2015-11-14 DIAGNOSIS — Z3493 Encounter for supervision of normal pregnancy, unspecified, third trimester: Secondary | ICD-10-CM

## 2015-11-14 NOTE — Progress Notes (Signed)
Pt is currently compliant on [employer].  

## 2015-11-14 NOTE — Progress Notes (Signed)
Subjective:  Cynthia Crawford is a 25 y.o. G1P0000 at [redacted]w[redacted]d being seen today for ongoing prenatal care.  She is currently monitored for the following issues for this low-risk pregnancy and has Migraine and Supervision of normal first pregnancy in first trimester on her problem list.  Patient reports no complaints.  Contractions: Not present. Vag. Bleeding: None.  Movement: Present. Denies leaking of fluid.   The following portions of the patient's history were reviewed and updated as appropriate: allergies, current medications, past family history, past medical history, past social history, past surgical history and problem list. Problem list updated.  Objective:   Filed Vitals:   11/14/15 1334  BP: 133/87  Pulse: 92  Weight: 154 lb (69.854 kg)    Fetal Status: Fetal Heart Rate (bpm): 147   Movement: Present     General:  Alert, oriented and cooperative. Patient is in no acute distress.  Skin: Skin is warm and dry. No rash noted.   Cardiovascular: Normal heart rate noted  Respiratory: Normal respiratory effort, no problems with respiration noted  Abdomen: Soft, gravid, appropriate for gestational age. Pain/Pressure: Absent     Pelvic: Vag. Bleeding: None Vag D/C Character: Thin   Cervical exam deferred        Extremities: Normal range of motion.  Edema: Trace  Mental Status: Normal mood and affect. Normal behavior. Normal judgment and thought content.   Urinalysis: Urine Protein: Negative Urine Glucose: Negative  Assessment and Plan:  Pregnancy: G1P0000 at [redacted]w[redacted]d  1. Supervision of normal pregnancy in third trimester Patient is doing well without complaints Third trimester labs and tdap today - Tdap vaccine greater than or equal to 7yo IM - CBC - HIV antibody - RPR - Glucose Tolerance, 1 HR (50g)  2. Supervision of normal first pregnancy in first trimester   Preterm labor symptoms and general obstetric precautions including but not limited to vaginal bleeding,  contractions, leaking of fluid and fetal movement were reviewed in detail with the patient. Please refer to After Visit Summary for other counseling recommendations.  No Follow-up on file.   Catalina Antigua, MD

## 2015-11-15 ENCOUNTER — Encounter (INDEPENDENT_AMBULATORY_CARE_PROVIDER_SITE_OTHER): Payer: BLUE CROSS/BLUE SHIELD | Admitting: *Deleted

## 2015-11-15 DIAGNOSIS — Z3402 Encounter for supervision of normal first pregnancy, second trimester: Secondary | ICD-10-CM

## 2015-11-15 LAB — CBC
HEMATOCRIT: 33.5 % — AB (ref 36.0–46.0)
HEMOGLOBIN: 10.9 g/dL — AB (ref 12.0–15.0)
MCH: 29.9 pg (ref 26.0–34.0)
MCHC: 32.5 g/dL (ref 30.0–36.0)
MCV: 92 fL (ref 78.0–100.0)
MPV: 11.6 fL (ref 8.6–12.4)
PLATELETS: 173 10*3/uL (ref 150–400)
RBC: 3.64 MIL/uL — AB (ref 3.87–5.11)
RDW: 13.3 % (ref 11.5–15.5)
WBC: 12.2 10*3/uL — ABNORMAL HIGH (ref 4.0–10.5)

## 2015-11-15 LAB — HIV ANTIBODY (ROUTINE TESTING W REFLEX): HIV 1&2 Ab, 4th Generation: NONREACTIVE

## 2015-11-15 LAB — GLUCOSE TOLERANCE, 1 HOUR (50G) W/O FASTING: GLUCOSE, 1 HR, GESTATIONAL: 82 mg/dL (ref <140)

## 2015-11-15 NOTE — Progress Notes (Signed)
Pt is currently [redacted] weeks pregnant, sent in FMLA paperwork to be completed for delivery.

## 2015-11-16 LAB — RPR

## 2015-12-12 ENCOUNTER — Ambulatory Visit (INDEPENDENT_AMBULATORY_CARE_PROVIDER_SITE_OTHER): Payer: BLUE CROSS/BLUE SHIELD | Admitting: Obstetrics & Gynecology

## 2015-12-12 VITALS — BP 133/82 | HR 80 | Wt 162.0 lb

## 2015-12-12 DIAGNOSIS — Z3403 Encounter for supervision of normal first pregnancy, third trimester: Secondary | ICD-10-CM

## 2015-12-12 NOTE — Progress Notes (Signed)
Subjective:  Cynthia Crawford is a 25 y.o. G1P0000 at 757w0d being seen today for ongoing prenatal care.  She is currently monitored for the following issues for this low-risk pregnancy and has Migraine and Supervision of normal first pregnancy in third trimester on her problem list.  Patient reports no complaints.  Contractions: Not present. Vag. Bleeding: None.  Movement: Present. Denies leaking of fluid.   The following portions of the patient's history were reviewed and updated as appropriate: allergies, current medications, past family history, past medical history, past social history, past surgical history and problem list. Problem list updated.  Objective:   Filed Vitals:   12/12/15 1408  BP: 133/82  Pulse: 80  Weight: 162 lb (73.483 kg)    Fetal Status: Fetal Heart Rate (bpm): 141 Fundal Height: 32 cm Movement: Present     General:  Alert, oriented and cooperative. Patient is in no acute distress.  Skin: Skin is warm and dry. No rash noted.   Cardiovascular: Normal heart rate noted  Respiratory: Normal respiratory effort, no problems with respiration noted  Abdomen: Soft, gravid, appropriate for gestational age. Pain/Pressure: Present     Pelvic: Vag. Bleeding: None Vag D/C Character: Thin  Cervical exam deferred        Extremities: Normal range of motion.  Edema: Trace  Mental Status: Normal mood and affect. Normal behavior. Normal judgment and thought content.   Urinalysis: Urine Protein: Negative Urine Glucose: Negative  Assessment and Plan:  Pregnancy: G1P0000 at 7057w0d  Supervision of normal first pregnancy in third trimester Monitor BP closely. Preterm labor symptoms and general obstetric precautions including but not limited to vaginal bleeding, contractions, leaking of fluid and fetal movement were reviewed in detail with the patient. Please refer to After Visit Summary for other counseling recommendations.  Return in about 4 weeks (around 01/09/2016) for 36  week OB Visit, Pelvic cultures.   Tereso NewcomerUgonna A Kree Rafter, MD

## 2015-12-12 NOTE — Progress Notes (Signed)
Baby scripts compliant 

## 2015-12-12 NOTE — Patient Instructions (Signed)
Return to clinic for any scheduled appointments or obstetric concerns, or go to MAU for evaluation  

## 2016-01-05 ENCOUNTER — Encounter: Payer: Self-pay | Admitting: *Deleted

## 2016-01-09 ENCOUNTER — Ambulatory Visit (INDEPENDENT_AMBULATORY_CARE_PROVIDER_SITE_OTHER): Payer: BLUE CROSS/BLUE SHIELD | Admitting: Obstetrics and Gynecology

## 2016-01-09 ENCOUNTER — Encounter: Payer: Self-pay | Admitting: Obstetrics and Gynecology

## 2016-01-09 VITALS — BP 141/86 | HR 81 | Wt 166.0 lb

## 2016-01-09 DIAGNOSIS — Z36 Encounter for antenatal screening of mother: Secondary | ICD-10-CM

## 2016-01-09 DIAGNOSIS — Z113 Encounter for screening for infections with a predominantly sexual mode of transmission: Secondary | ICD-10-CM

## 2016-01-09 DIAGNOSIS — Z3403 Encounter for supervision of normal first pregnancy, third trimester: Secondary | ICD-10-CM

## 2016-01-09 LAB — OB RESULTS CONSOLE GC/CHLAMYDIA: Gonorrhea: NEGATIVE

## 2016-01-09 LAB — OB RESULTS CONSOLE GBS: STREP GROUP B AG: NEGATIVE

## 2016-01-09 NOTE — Progress Notes (Signed)
Subjective:  Cynthia Crawford is a 25 y.o. G1P0000 at 4739w0d being seen today for ongoing prenatal care.  She is currently monitored for the following issues for this low-risk pregnancy and has Migraine and Supervision of normal first pregnancy in third trimester on her problem list.  Patient reports no complaints. She denies Ha, visual disturbances, RUQ/epigastric pain Contractions: Irregular. Vag. Bleeding: None.  Movement: Present. Denies leaking of fluid.   The following portions of the patient's history were reviewed and updated as appropriate: allergies, current medications, past family history, past medical history, past social history, past surgical history and problem list. Problem list updated.  Objective:   Filed Vitals:   01/09/16 1407  BP: 141/86  Pulse: 81  Weight: 166 lb (75.297 kg)    Fetal Status: Fetal Heart Rate (bpm): 138 Fundal Height: 36 cm Movement: Present  Presentation: Vertex  General:  Alert, oriented and cooperative. Patient is in no acute distress.  Skin: Skin is warm and dry. No rash noted.   Cardiovascular: Normal heart rate noted  Respiratory: Normal respiratory effort, no problems with respiration noted  Abdomen: Soft, gravid, appropriate for gestational age. Pain/Pressure: Present     Pelvic: Vag. Bleeding: None Vag D/C Character: Thin   Cervical exam performed Dilation: 1 Effacement (%): Thick Station: Ballotable  Extremities: Normal range of motion.  Edema: Trace  Mental Status: Normal mood and affect. Normal behavior. Normal judgment and thought content.   Urinalysis: Urine Protein: Negative Urine Glucose: Negative  Assessment and Plan:  Pregnancy: G1P0000 at 3239w0d  1. Supervision of normal first pregnancy in third trimester Patient with elevated BP today. BP has been normal via [employer]. Patient is asymptomatic. CBC. CMP and 24 hr urine collection ordered Cultures collected today - Culture, beta strep (group b only) - GC/Chlamydia  probe amp ([place])not at [facility] - CBC - Comprehensive metabolic panel - Protein, urine, 24 hour  Preterm labor symptoms, preeclampsia precautions and general obstetric precautions including but not limited to vaginal bleeding, contractions, leaking of fluid and fetal movement were reviewed in detail with the patient. Please refer to After Visit Summary for other counseling recommendations.  Return in about 2 weeks (around 01/23/2016).   Catalina AntiguaPeggy Tanielle Emigh, MD

## 2016-01-10 LAB — GC/CHLAMYDIA PROBE AMP (~~LOC~~) NOT AT ARMC
CHLAMYDIA, DNA PROBE: NEGATIVE
NEISSERIA GONORRHEA: NEGATIVE

## 2016-01-11 LAB — CULTURE, BETA STREP (GROUP B ONLY)

## 2016-01-15 ENCOUNTER — Telehealth: Payer: Self-pay | Admitting: Obstetrics and Gynecology

## 2016-01-15 NOTE — Telephone Encounter (Signed)
ON call phone call received from [employer] monitoring service;  Pt had BP 132/97, recheck at 142/92.  No headache or ruq pain noted Phone call to pt who is scheduled for turning in 24 hr urine in am.  Pt will report findings to office at that time, and have BP rechecked. Pt comfortable with this f/u/.

## 2016-01-16 ENCOUNTER — Inpatient Hospital Stay (EMERGENCY_DEPARTMENT_HOSPITAL)
Admission: AD | Admit: 2016-01-16 | Discharge: 2016-01-17 | Disposition: A | Discharge status: Home / Self Care | Payer: BLUE CROSS/BLUE SHIELD | Source: Ambulatory Visit | Attending: Family Medicine | Admitting: Family Medicine

## 2016-01-16 ENCOUNTER — Encounter (HOSPITAL_COMMUNITY): Payer: Self-pay

## 2016-01-16 DIAGNOSIS — O133 Gestational [pregnancy-induced] hypertension without significant proteinuria, third trimester: Secondary | ICD-10-CM | POA: Diagnosis not present

## 2016-01-16 DIAGNOSIS — O163 Unspecified maternal hypertension, third trimester: Secondary | ICD-10-CM

## 2016-01-16 DIAGNOSIS — O134 Gestational [pregnancy-induced] hypertension without significant proteinuria, complicating childbirth: Secondary | ICD-10-CM | POA: Diagnosis not present

## 2016-01-16 LAB — COMPREHENSIVE METABOLIC PANEL
ALT: 16 U/L (ref 14–54)
AST: 22 U/L (ref 15–41)
Albumin: 3.1 g/dL — ABNORMAL LOW (ref 3.5–5.0)
Alkaline Phosphatase: 106 U/L (ref 38–126)
Anion gap: 5 (ref 5–15)
BUN: 8 mg/dL (ref 6–20)
CO2: 22 mmol/L (ref 22–32)
Calcium: 8.9 mg/dL (ref 8.9–10.3)
Chloride: 106 mmol/L (ref 101–111)
Creatinine, Ser: 0.56 mg/dL (ref 0.44–1.00)
GFR calc Af Amer: >60 mL/min (ref >60)
GFR calc non Af Amer: >60 mL/min (ref >60)
Glucose, Bld: 102 mg/dL — ABNORMAL HIGH (ref 65–99)
Potassium: 3.6 mmol/L (ref 3.5–5.1)
Sodium: 133 mmol/L — ABNORMAL LOW (ref 135–145)
Total Bilirubin: 0.4 mg/dL (ref 0.3–1.2)
Total Protein: 7 g/dL (ref 6.5–8.1)

## 2016-01-16 LAB — CBC
HCT: 34 % — ABNORMAL LOW (ref 36.0–46.0)
Hemoglobin: 11.6 g/dL — ABNORMAL LOW (ref 12.0–15.0)
MCH: 30.2 pg (ref 26.0–34.0)
MCHC: 34.1 g/dL (ref 30.0–36.0)
MCV: 88.5 fL (ref 78.0–100.0)
Platelets: 163 10*3/uL (ref 150–400)
RBC: 3.84 MIL/uL — ABNORMAL LOW (ref 3.87–5.11)
RDW: 13.5 % (ref 11.5–15.5)
WBC: 12.5 10*3/uL — ABNORMAL HIGH (ref 4.0–10.5)

## 2016-01-16 LAB — PROTEIN / CREATININE RATIO, URINE
Creatinine, Urine: 39 mg/dL
Protein Creatinine Ratio: 0.21 mg/mg{Cre} — ABNORMAL HIGH (ref 0.00–0.15)
Total Protein, Urine: 8 mg/dL

## 2016-01-16 NOTE — MAU Note (Signed)
Urine collected and sent to lab.

## 2016-01-16 NOTE — MAU Note (Signed)
Pt reports pressure in perineum for the last 2 hours,  ? Contractions. Denies bleeding or ROM

## 2016-01-17 ENCOUNTER — Inpatient Hospital Stay (HOSPITAL_COMMUNITY): Payer: BLUE CROSS/BLUE SHIELD | Admitting: Anesthesiology

## 2016-01-17 ENCOUNTER — Inpatient Hospital Stay (HOSPITAL_COMMUNITY)
Admission: AD | Admit: 2016-01-17 | Discharge: 2016-01-19 | DRG: 775 | Disposition: A | Discharge status: Home / Self Care | Payer: BLUE CROSS/BLUE SHIELD | Source: Ambulatory Visit | Attending: Family Medicine | Admitting: Family Medicine

## 2016-01-17 ENCOUNTER — Other Ambulatory Visit: Payer: BLUE CROSS/BLUE SHIELD | Admitting: *Deleted

## 2016-01-17 ENCOUNTER — Encounter (HOSPITAL_COMMUNITY): Payer: Self-pay | Admitting: *Deleted

## 2016-01-17 DIAGNOSIS — Z6830 Body mass index (BMI) 30.0-30.9, adult: Secondary | ICD-10-CM | POA: Diagnosis not present

## 2016-01-17 DIAGNOSIS — E669 Obesity, unspecified: Secondary | ICD-10-CM | POA: Diagnosis present

## 2016-01-17 DIAGNOSIS — Z3A37 37 weeks gestation of pregnancy: Secondary | ICD-10-CM

## 2016-01-17 DIAGNOSIS — Z880 Allergy status to penicillin: Secondary | ICD-10-CM

## 2016-01-17 DIAGNOSIS — Z825 Family history of asthma and other chronic lower respiratory diseases: Secondary | ICD-10-CM | POA: Diagnosis not present

## 2016-01-17 DIAGNOSIS — O99354 Diseases of the nervous system complicating childbirth: Secondary | ICD-10-CM | POA: Diagnosis present

## 2016-01-17 DIAGNOSIS — J45909 Unspecified asthma, uncomplicated: Secondary | ICD-10-CM | POA: Diagnosis present

## 2016-01-17 DIAGNOSIS — O9952 Diseases of the respiratory system complicating childbirth: Secondary | ICD-10-CM | POA: Diagnosis present

## 2016-01-17 DIAGNOSIS — G43909 Migraine, unspecified, not intractable, without status migrainosus: Secondary | ICD-10-CM | POA: Diagnosis present

## 2016-01-17 DIAGNOSIS — F419 Anxiety disorder, unspecified: Secondary | ICD-10-CM | POA: Diagnosis present

## 2016-01-17 DIAGNOSIS — O134 Gestational [pregnancy-induced] hypertension without significant proteinuria, complicating childbirth: Secondary | ICD-10-CM | POA: Diagnosis present

## 2016-01-17 DIAGNOSIS — O99214 Obesity complicating childbirth: Secondary | ICD-10-CM | POA: Diagnosis present

## 2016-01-17 DIAGNOSIS — O133 Gestational [pregnancy-induced] hypertension without significant proteinuria, third trimester: Secondary | ICD-10-CM

## 2016-01-17 DIAGNOSIS — Z833 Family history of diabetes mellitus: Secondary | ICD-10-CM | POA: Diagnosis not present

## 2016-01-17 DIAGNOSIS — O99344 Other mental disorders complicating childbirth: Secondary | ICD-10-CM | POA: Diagnosis present

## 2016-01-17 LAB — COMPREHENSIVE METABOLIC PANEL
ALBUMIN: 3.2 g/dL — AB (ref 3.6–5.1)
ALBUMIN: 3.2 g/dL — AB (ref 3.5–5.0)
ALK PHOS: 112 U/L (ref 33–115)
ALK PHOS: 120 U/L (ref 38–126)
ALT: 14 U/L (ref 6–29)
ALT: 18 U/L (ref 14–54)
ANION GAP: 8 (ref 5–15)
AST: 17 U/L (ref 10–30)
AST: 22 U/L (ref 15–41)
BILIRUBIN TOTAL: 0.2 mg/dL (ref 0.2–1.2)
BUN: 6 mg/dL — ABNORMAL LOW (ref 7–25)
BUN: 7 mg/dL (ref 6–20)
CALCIUM: 8.6 mg/dL (ref 8.6–10.2)
CALCIUM: 8.6 mg/dL — AB (ref 8.9–10.3)
CHLORIDE: 105 mmol/L (ref 101–111)
CO2: 22 mmol/L (ref 20–31)
CO2: 21 mmol/L — AB (ref 22–32)
Chloride: 104 mmol/L (ref 98–110)
Creat: 0.62 mg/dL (ref 0.50–1.10)
Creatinine, Ser: 0.56 mg/dL (ref 0.44–1.00)
GFR calc Af Amer: >60 mL/min (ref >60)
GFR calc non Af Amer: >60 mL/min (ref >60)
GLUCOSE: 77 mg/dL (ref 65–99)
Glucose, Bld: 72 mg/dL (ref 65–99)
POTASSIUM: 3.9 mmol/L (ref 3.5–5.1)
Potassium: 3.8 mmol/L (ref 3.5–5.3)
SODIUM: 136 mmol/L (ref 135–146)
SODIUM: 134 mmol/L — AB (ref 135–145)
Total Bilirubin: 0.2 mg/dL — ABNORMAL LOW (ref 0.3–1.2)
Total Protein: 6.3 g/dL (ref 6.1–8.1)
Total Protein: 7.1 g/dL (ref 6.5–8.1)

## 2016-01-17 LAB — CBC
HCT: 36.1 % (ref 36.0–46.0)
HEMATOCRIT: 35.9 % (ref 35.0–45.0)
HEMATOCRIT: 35.2 % — AB (ref 36.0–46.0)
HEMOGLOBIN: 12 g/dL (ref 11.7–15.5)
HEMOGLOBIN: 12 g/dL (ref 12.0–15.0)
Hemoglobin: 12.3 g/dL (ref 12.0–15.0)
MCH: 30.1 pg (ref 27.0–33.0)
MCH: 30.2 pg (ref 26.0–34.0)
MCH: 30.1 pg (ref 26.0–34.0)
MCHC: 33.4 g/dL (ref 32.0–36.0)
MCHC: 34.1 g/dL (ref 30.0–36.0)
MCHC: 34.1 g/dL (ref 30.0–36.0)
MCV: 90 fL (ref 80.0–100.0)
MCV: 88.7 fL (ref 78.0–100.0)
MCV: 88.2 fL (ref 78.0–100.0)
MPV: 11.7 fL (ref 7.5–12.5)
PLATELETS: 169 10*3/uL (ref 150–400)
Platelets: 169 10*3/uL (ref 140–400)
Platelets: 175 10*3/uL (ref 150–400)
RBC: 3.99 MIL/uL (ref 3.80–5.10)
RBC: 4.07 MIL/uL (ref 3.87–5.11)
RBC: 3.99 MIL/uL (ref 3.87–5.11)
RDW: 13.6 % (ref 11.0–15.0)
RDW: 13.5 % (ref 11.5–15.5)
RDW: 13.4 % (ref 11.5–15.5)
WBC: 9.5 10*3/uL (ref 3.8–10.8)
WBC: 11.7 10*3/uL — ABNORMAL HIGH (ref 4.0–10.5)
WBC: 15.5 10*3/uL — AB (ref 4.0–10.5)

## 2016-01-17 LAB — TYPE AND SCREEN
ABO/RH(D): O POS
Antibody Screen: NEGATIVE

## 2016-01-17 LAB — PROTEIN / CREATININE RATIO, URINE
CREATININE, URINE: 73 mg/dL
PROTEIN CREATININE RATIO: 0.21 mg/mg{creat} — AB (ref 0.00–0.15)
Total Protein, Urine: 15 mg/dL

## 2016-01-17 LAB — PROTEIN, URINE, 24 HOUR
PROTEIN 24H UR: 242 mg/(24.h) — AB (ref <150)
PROTEIN, URINE: 11 mg/dL (ref 5–24)

## 2016-01-17 LAB — ABO/RH: ABO/RH(D): O POS

## 2016-01-17 MED ORDER — ONDANSETRON HCL 4 MG/2ML IJ SOLN
4.0000 mg | Freq: Four times a day (QID) | INTRAMUSCULAR | Status: DC | PRN
  Filled 2016-01-17: qty 2

## 2016-01-17 MED ORDER — LACTATED RINGERS IV SOLN: 500.0000 mL | Freq: Once | INTRAVENOUS | Status: DC

## 2016-01-17 MED ORDER — LACTATED RINGERS IV SOLN
INTRAVENOUS | Status: DC
  Administered 2016-01-17: 19:00:00 via INTRAVENOUS

## 2016-01-17 MED ORDER — MISOPROSTOL 200 MCG PO TABS: 50.0000 ug | ORAL_TABLET | ORAL | Status: DC

## 2016-01-17 MED ORDER — OXYCODONE-ACETAMINOPHEN 5-325 MG PO TABS: 1.0000 | ORAL_TABLET | ORAL | Status: DC | PRN

## 2016-01-17 MED ORDER — DIPHENHYDRAMINE HCL 50 MG/ML IJ SOLN: 12.5000 mg | INTRAMUSCULAR | Status: DC | PRN

## 2016-01-17 MED ORDER — LIDOCAINE HCL (PF) 1 % IJ SOLN
30.0000 mL | INTRAMUSCULAR | Status: DC | PRN
Start: 2016-01-17 — End: 2016-01-18
  Administered 2016-01-18: 30 mL via SUBCUTANEOUS
  Filled 2016-01-17: qty 30

## 2016-01-17 MED ORDER — PHENYLEPHRINE 40 MCG/ML (10ML) SYRINGE FOR IV PUSH (FOR BLOOD PRESSURE SUPPORT)
80.0000 ug | PREFILLED_SYRINGE | INTRAVENOUS | Status: DC | PRN
  Filled 2016-01-17: qty 5

## 2016-01-17 MED ORDER — CITRIC ACID-SODIUM CITRATE 334-500 MG/5ML PO SOLN: 30.0000 mL | ORAL | Status: DC | PRN

## 2016-01-17 MED ORDER — LIDOCAINE HCL (PF) 1 % IJ SOLN
INTRAMUSCULAR | Status: DC | PRN
  Administered 2016-01-17 (×2): 4 mL

## 2016-01-17 MED ORDER — TERBUTALINE SULFATE 1 MG/ML IJ SOLN
0.2500 mg | Freq: Once | INTRAMUSCULAR | Status: DC | PRN
  Filled 2016-01-17: qty 1

## 2016-01-17 MED ORDER — OXYTOCIN BOLUS FROM INFUSION
500.0000 mL | INTRAVENOUS | Status: DC
  Administered 2016-01-18: 500 mL via INTRAVENOUS

## 2016-01-17 MED ORDER — FENTANYL CITRATE (PF) 100 MCG/2ML IJ SOLN
100.0000 ug | INTRAMUSCULAR | Status: DC | PRN
  Administered 2016-01-17 (×2): 100 ug via INTRAVENOUS
  Filled 2016-01-17 (×2): qty 2

## 2016-01-17 MED ORDER — LACTATED RINGERS IV SOLN: 500.0000 mL | INTRAVENOUS | Status: DC | PRN

## 2016-01-17 MED ORDER — ACETAMINOPHEN 325 MG PO TABS
650.0000 mg | ORAL_TABLET | ORAL | Status: DC | PRN
  Filled 2016-01-17: qty 2

## 2016-01-17 MED ORDER — OXYTOCIN 10 UNIT/ML IJ SOLN
1.0000 m[IU]/min | INTRAMUSCULAR | Status: DC
  Administered 2016-01-17: 2 m[IU]/min via INTRAVENOUS

## 2016-01-17 MED ORDER — MISOPROSTOL 25 MCG QUARTER TABLET
25.0000 ug | ORAL_TABLET | ORAL | Status: DC | PRN
  Administered 2016-01-17: 25 ug via VAGINAL
  Filled 2016-01-17: qty 0.25

## 2016-01-17 MED ORDER — FENTANYL 2.5 MCG/ML BUPIVACAINE 1/10 % EPIDURAL INFUSION (WH - ANES)
14.0000 mL/h | INTRAMUSCULAR | Status: DC | PRN
  Administered 2016-01-17 – 2016-01-18 (×2): 14 mL/h via EPIDURAL
  Filled 2016-01-17 (×2): qty 125

## 2016-01-17 MED ORDER — OXYTOCIN 10 UNIT/ML IJ SOLN
2.5000 [IU]/h | INTRAVENOUS | Status: DC
  Filled 2016-01-17: qty 4

## 2016-01-17 MED ORDER — OXYCODONE-ACETAMINOPHEN 5-325 MG PO TABS: 2.0000 | ORAL_TABLET | ORAL | Status: DC | PRN

## 2016-01-17 MED ORDER — PHENYLEPHRINE 40 MCG/ML (10ML) SYRINGE FOR IV PUSH (FOR BLOOD PRESSURE SUPPORT)
80.0000 ug | PREFILLED_SYRINGE | INTRAVENOUS | Status: DC | PRN
Start: 2016-01-17 — End: 2016-01-18
  Filled 2016-01-17: qty 5
  Filled 2016-01-17: qty 10

## 2016-01-17 MED ORDER — EPHEDRINE 5 MG/ML INJ
10.0000 mg | INTRAVENOUS | Status: DC | PRN
  Filled 2016-01-17: qty 2

## 2016-01-17 NOTE — Progress Notes (Signed)
LABOR PROGRESS NOTE  Cynthia Crawford is a 25 y.o. G1P0000 at 4133w1d  admitted for IOL for gHTN.  Subjective: Patient feeling well.  No headaches or blurry vision.  Foley out approximately 20:00.  Objective: BP 139/90 mmHg  Pulse 84  Temp(Src) 97.9 F (36.6 C) (Oral)  Resp 18  Ht 5\' 3"  (1.6 m)  Wt 170 lb (77.111 kg)  BMI 30.12 kg/m2  LMP 05/02/2015 or  Filed Vitals:   01/17/16 1455 01/17/16 1622 01/17/16 1842 01/17/16 2003  BP:  120/76 120/85 139/90  Pulse:  80 78 84  Temp:  98.2 F (36.8 C) 97.9 F (36.6 C)   TempSrc:  Oral Oral   Resp: 18 18 18 18   Height:      Weight:        Gen: alert, in NAD Chest: normal WOB Dilation: 4.5 Effacement (%): 60 Cervical Position: Middle Station: -2 Presentation: Vertex Exam by:: Leta JunglingEmilie Siska, RN  Labs: Lab Results  Component Value Date   WBC 15.5* 01/17/2016   HGB 12.0 01/17/2016   HCT 35.2* 01/17/2016   MCV 88.2 01/17/2016   PLT 175 01/17/2016    Patient Active Problem List   Diagnosis Date Noted  . Supervision of normal first pregnancy in third trimester 06/27/2015  . Migraine 09/25/2011    Assessment / Plan: 25 y.o. G1P0000 at 3433w1d here for IOL for gHTN.  Labor: Foley out approx. 20:00.  SVE 4.5/60/-2.  Pitocin started. Fetal Wellbeing:  Category 1 tracing Pain Control:  Overall comfortable currently, planning epidural Anticipated MOD:  Anticipate SVD  Caesar ChestnutKelly E Gabor Lusk, MD 01/17/2016, 9:39 PM

## 2016-01-17 NOTE — Discharge Instructions (Signed)

## 2016-01-17 NOTE — Anesthesia Pain Management Evaluation Note (Signed)
  [facility] Pain Management Visit Note  Patient: Cynthia JarvisChasity Hargrave Crawford, 25 y.o., female  "Hello I am a member of the anesthesia team at [facility]. We have an anesthesia team available at all times to provide care throughout the hospital, including epidural management and anesthesia for C-section. I don't know your plan for the delivery whether it a natural birth, water birth, IV sedation, nitrous supplementation, doula or epidural, but we want to meet your pain goals."   1.Was your pain managed to your expectations on prior hospitalizations?     2.What is your expectation for pain management during this hospitalization?      3.How can we help you reach that goal?   Record the patient's initial score and the patient's pain goal.   Pain: 5  Pain Goal: 6 [facility] wants you to be able to say your pain was always managed very well.  Cynthia EmperorMalinova,Cynthia Crawford 01/17/2016

## 2016-01-17 NOTE — Anesthesia Procedure Notes (Signed)
Epidural Patient location during procedure: OB  Staffing Anesthesiologist: Gavriel Holzhauer Performed by: anesthesiologist   Preanesthetic Checklist Completed: patient identified, site marked, surgical consent, pre-op evaluation, timeout performed, IV checked, risks and benefits discussed and monitors and equipment checked  Epidural Patient position: sitting Prep: site prepped and draped and DuraPrep Patient monitoring: continuous pulse ox and blood pressure Approach: midline Location: L3-L4 Injection technique: LOR saline  Needle:  Needle type: Tuohy  Needle gauge: 17 G Needle length: 9 cm and 9 Needle insertion depth: 5 cm cm Catheter type: closed end flexible Catheter size: 19 Gauge Catheter at skin depth: 10 cm Test dose: negative  Assessment Events: blood not aspirated, injection not painful, no injection resistance, negative IV test and no paresthesia  Additional Notes Patient identified. Risks/Benefits/Options discussed with patient including but not limited to bleeding, infection, nerve damage, paralysis, failed block, incomplete pain control, headache, blood pressure changes, nausea, vomiting, reactions to medication both or allergic, itching and postpartum back pain. Confirmed with bedside nurse the patient's most recent platelet count. Confirmed with patient that they are not currently taking any anticoagulation, have any bleeding history or any family history of bleeding disorders. Patient expressed understanding and wished to proceed. All questions were answered. Sterile technique was used throughout the entire procedure. Please see nursing notes for vital signs. Test dose was given through epidural catheter and negative prior to continuing to dose epidural or start infusion. Warning signs of high block given to the patient including shortness of breath, tingling/numbness in hands, complete motor block, or any concerning symptoms with instructions to call for help. Patient was  given instructions on fall risk and not to get out of bed. All questions and concerns addressed with instructions to call with any issues or inadequate analgesia.      

## 2016-01-17 NOTE — Progress Notes (Signed)
Pt is 37w 2d, presenting today for BP check.  Seen in MAU last night for possible labor and had several elevated BP readings.  BP today = 142/97, pt woke up with slight HA this morning, denies any visual changes or epigastric pain.  Called Dr Shawnie PonsPratt with BP result, [facility] to [facility]&D for induction.

## 2016-01-17 NOTE — MAU Provider Note (Signed)
History     CSN: [id number]  Arrival date and time: 01/16/16 2138   None     No chief complaint on file.  HPI Cynthia Crawford 25 y.o. G1P0000 [redacted]w[redacted]d presents for a labor check. I was called by the nurse for elevated blood pressures.   Past Medical History  Diagnosis Date  . Asthma   . Anxiety     Past Surgical History  Procedure Laterality Date  . Tonsillectomy      Family History  Problem Relation Age of Onset  . Diabetes Father   . Asthma Mother     Social History  Substance Use Topics  . Smoking status: Never Smoker   . Smokeless tobacco: Never Used  . Alcohol Use: No    Allergies:  Allergies  Allergen Reactions  . Penicillins Hives    Has patient had a PCN reaction causing immediate rash, facial/tongue/throat swelling, SOB or lightheadedness with hypotension: Yes Has patient had a PCN reaction causing severe rash involving mucus membranes or skin necrosis: No Has patient had a PCN reaction that required hospitalization No Has patient had a PCN reaction occurring within the last 10 years: No If all of the above answers are "NO", then may proceed with Cephalosporin use.  . Sulfa Antibiotics Hives    Prescriptions prior to admission  Medication Sig Dispense Refill Last Dose  . calcium carbonate (TUMS - DOSED IN MG ELEMENTAL CALCIUM) 500 MG chewable tablet Chew 1 tablet by mouth daily.   Past Week at Unknown time  . Prenatal Vit-Fe Fumarate-FA (MULTIVITAMIN-PRENATAL) 27-0.8 MG TABS tablet Take 1 tablet by mouth daily at 12 noon.   01/16/2016 at 1900  . promethazine (PHENERGAN) 25 MG tablet Take 1 tablet (25 mg total) by mouth every 6 (six) hours as needed for nausea or vomiting. 30 tablet 2 More than a month at Unknown time    Review of Systems  Constitutional: Negative for fever.  Gastrointestinal: Positive for abdominal pain.  All other systems reviewed and are negative.  Physical Exam   Blood pressure 131/80, pulse 82, temperature 98.1 F (36.7  C), temperature source Oral, resp. rate 16, height  (1.6 m), weight 169 lb (76.658 kg), last menstrual period 05/02/2015, SpO2 100 %.   01/16/16 2349  --  81  --  --  139/83 mmHg  --  --  --  -- DS    01/16/16 2319  --  89  --  --  141/95 mmHg  --  --  --  -- DS   01/16/16 2304  --  88  --  --  144/92 mmHg  --  --  --  -- DS   01/16/16 2249  --  83  --  --  143/85 mmHg  --  --  --  -- DS   01/16/16 2234  --  84  --  --  141/79 mmHg  --  --  --  -- DS   01/16/16 2217  --  89  --  --  134/88 mmHg  --  --  --  -- DS   01/16/16 2155  98.1 F (36.7 C)  85  --  16  144/87 mmHg  Sitting  100 %  Room Air  169 lb (76.658 kg) LW       Physical Exam  Nursing note and vitals reviewed. Constitutional: She is oriented to person, place, and time. She appears well-developed and well-nourished.  HENT:  Head: Normocephalic and atraumatic.  Cardiovascular: Normal  rate and regular rhythm.   Respiratory: Effort normal and breath sounds normal. No respiratory distress.  GI: Soft. There is no tenderness.  Musculoskeletal: Normal range of motion.  Neurological: She is alert and oriented to person, place, and time.  Skin: Skin is warm and dry.  Psychiatric: She has a normal mood and affect. Her behavior is normal. Judgment and thought content normal.   Dilation: 1 Effacement (%): Thick Station: -3 Presentation: Vertex Exam by:: Camelia Enganielle Simpson RN Cervix is unchanged FHR- reactive Irregular contractions MAU Course  Procedures  MDM Preeclampsia labs are stable. Pt denies headache, visual disturbances, epigastric pain. Pt will need to follow up in clinic tomorrow for blood pressure check. Gestational hypertension likely with early induction of labor.  Assessment and Plan  Elevated blood pressure in pregnancy Follow up in clinic tomorrow for blood pressure check Preeclampsia s/sx reviewed Discharge    Clemmons,Lori Grissett 01/17/2016, 12:17 AM

## 2016-01-17 NOTE — Progress Notes (Signed)
Spoke with dr Alvester Morinnewton - pt c/o pain - too many uc's for po cytotec - order for fentanyl and ok not to administer cytotec

## 2016-01-17 NOTE — Anesthesia Preprocedure Evaluation (Signed)
Anesthesia Evaluation  Patient identified by MRN, date of birth, ID band Patient awake    Reviewed: Allergy & Precautions, NPO status , Patient's Chart, lab work & pertinent test results  History of Anesthesia Complications Negative for: history of anesthetic complications  Airway Mallampati: II  TM Distance: >3 FB Neck ROM: Full    Dental no notable dental hx. (+) Dental Advisory Given   Pulmonary asthma ,    Pulmonary exam normal breath sounds clear to auscultation       Cardiovascular hypertension (gHTN), Normal cardiovascular exam Rhythm:Regular Rate:Normal     Neuro/Psych  Headaches, PSYCHIATRIC DISORDERS Anxiety    GI/Hepatic negative GI ROS, Neg liver ROS,   Endo/Other  obesity  Renal/GU negative Renal ROS  negative genitourinary   Musculoskeletal negative musculoskeletal ROS (+)   Abdominal   Peds negative pediatric ROS (+)  Hematology negative hematology ROS (+)   Anesthesia Other Findings   Reproductive/Obstetrics negative OB ROS                             Anesthesia Physical Anesthesia Plan  ASA: II  Anesthesia Plan: Epidural   Post-op Pain Management:    Induction:   Airway Management Planned:   Additional Equipment:   Intra-op Plan:   Post-operative Plan:   Informed Consent: I have reviewed the patients History and Physical, chart, labs and discussed the procedure including the risks, benefits and alternatives for the proposed anesthesia with the patient or authorized representative who has indicated his/her understanding and acceptance.   Dental advisory given  Plan Discussed with: CRNA  Anesthesia Plan Comments:         Anesthesia Quick Evaluation

## 2016-01-17 NOTE — H&P (Signed)
LABOR AND DELIVERY ADMISSION HISTORY AND PHYSICAL NOTE  Cynthia Crawford is a 25 y.o. female G1P0000 with IUP at [redacted]w[redacted]d by LMP + 19 wk [place] presenting for IOL for GHTN. She was seen in the MAU last night and noted to have BPs to 131/80. She was seen in clinic this morning for BP check. Her BPs were still elevated and she was sent to [facility] for induction. She endorses mild headache that started this morning. She denies blurry vision or RUQ/epigastric pain.  She reports positive fetal movement. She denies leakage of fluid or vaginal bleeding.  Prenatal History/Complications: GHTN in the third trimester, hx migraines  Past Medical History: Past Medical History  Diagnosis Date  . Asthma   . Anxiety   . Hypertension   . Pregnancy induced hypertension     Past Surgical History: Past Surgical History  Procedure Laterality Date  . Tonsillectomy      Obstetrical History: OB History    Gravida Para Term Preterm AB TAB SAB Ectopic Multiple Living   1 0 0 0 0 0 0 0 0 0       Social History: Social History   Social History  . Marital Status: Married    Spouse Name: N/A  . Number of Children: N/A  . Years of Education: N/A   Social History Main Topics  . Smoking status: Never Smoker   . Smokeless tobacco: Never Used  . Alcohol Use: No  . Drug Use: No  . Sexual Activity:    Partners: Male    Birth Control/ Protection: None   Other Topics Concern  . None   Social History Narrative    Family History: Family History  Problem Relation Age of Onset  . Diabetes Father   . Asthma Mother     Allergies: Allergies  Allergen Reactions  . Penicillins Hives    Has patient had a PCN reaction causing immediate rash, facial/tongue/throat swelling, SOB or lightheadedness with hypotension: Yes Has patient had a PCN reaction causing severe rash involving mucus membranes or skin necrosis: No Has patient had a PCN reaction that required hospitalization No Has patient had a PCN  reaction occurring within the last 10 years: No If all of the above answers are "NO", then may proceed with Cephalosporin use.  . Sulfa Antibiotics Hives    Prescriptions prior to admission  Medication Sig Dispense Refill Last Dose  . cetirizine (ZYRTEC) 10 MG tablet Take 10 mg by mouth daily.   Past Week at Unknown time  . Prenatal Vit-Fe Fumarate-FA (MULTIVITAMIN-PRENATAL) 27-0.8 MG TABS tablet Take 1 tablet by mouth daily at 12 noon.    01/16/2016 at Unknown time     Review of Systems   All systems reviewed and negative except as stated in HPI  Blood pressure 148/95, pulse 87, temperature 98.5 F (36.9 C), temperature source Oral, resp. rate 18, height 5\' 3"  (1.6 m), weight 170 lb (77.111 kg), last menstrual period 05/02/2015. General appearance: alert, cooperative and no distress Lungs: clear to auscultation bilaterally Heart: regular rate and rhythm Abdomen: soft, non-tender; bowel sounds normal Extremities: No calf swelling or tenderness Presentation: cephalic Fetal monitoring: 150bpm, moderate variability, accelerations present, no decelerations. Uterine activity: Irregular mild contractions. Dilation: 1.5 Station: -2 Exam by:: Dr.Mayo   Prenatal labs: ABO, Rh: O/POS/-- (10/10 1601) Antibody: NEG (10/10 1601) Rubella: Immune RPR: NON REAC (02/27 1411)  HBsAg: NEGATIVE (10/10 1601)  HIV: NONREACTIVE (02/27 1411)  GBS: Negative (04/24 0000)  1 hr Glucola: Third trimester 24  Genetic screening:  Declined Anatomy US: normal  Prenatal Transfer Tool  Maternal Diabetes: No Genetic Screening: Declined Maternal Ultrasounds/Referrals: Normal Fetal Ultrasounds or other Referrals:  None Maternal Substance Abuse:  No Significant Maternal Medications:  None Significant Maternal Lab Results: Lab values include: Group B Strep negative  Results for orders placed or performed during the hospital encounter of 01/17/16 (from the past 24 hour(s))  Protein / creatinine ratio, urine    Collection Time: 01/17/16 10:15 AM  Result Value Ref Range   Creatinine, Urine 73.00 mg/dL   Total Protein, Urine 15 mg/dL   Protein Creatinine Ratio 0.21 (H) 0.00 - 0.15 mg/mg[Cre]  CBC   Collection Time: 01/17/16 10:20 AM  Result Value Ref Range   WBC 11.7 (H) 4.0 - 10.5 K/uL   RBC 4.07 3.87 - 5.11 MIL/uL   Hemoglobin 12.3 12.0 - 15.0 g/dL   HCT 16.1 09.6 - 04.5 %   MCV 88.7 78.0 - 100.0 fL   MCH 30.2 26.0 - 34.0 pg   MCHC 34.1 30.0 - 36.0 g/dL   RDW 40.9 81.1 - 91.4 %   Platelets 169 150 - 400 K/uL  Comprehensive metabolic panel   Collection Time: 01/17/16 10:20 AM  Result Value Ref Range   Sodium 134 (L) 135 - 145 mmol/L   Potassium 3.9 3.5 - 5.1 mmol/L   Chloride 105 101 - 111 mmol/L   CO2 21 (L) 22 - 32 mmol/L   Glucose, Bld 77 65 - 99 mg/dL   BUN 7 6 - 20 mg/dL   Creatinine, Ser 7.82 0.44 - 1.00 mg/dL   Calcium 8.6 (L) 8.9 - 10.3 mg/dL   Total Protein 7.1 6.5 - 8.1 g/dL   Albumin 3.2 (L) 3.5 - 5.0 g/dL   AST 22 15 - 41 U/L   ALT 18 14 - 54 U/L   Alkaline Phosphatase 120 38 - 126 U/L   Total Bilirubin 0.2 (L) 0.3 - 1.2 mg/dL   GFR calc non Af Amer >60 >60 mL/min   GFR calc Af Amer >60 >60 mL/min   Anion gap 8 5 - 15  Results for orders placed or performed during the hospital encounter of 01/16/16 (from the past 24 hour(s))  Protein / creatinine ratio, urine   Collection Time: 01/16/16  9:55 PM  Result Value Ref Range   Creatinine, Urine 39.00 mg/dL   Total Protein, Urine 8 mg/dL   Protein Creatinine Ratio 0.21 (H) 0.00 - 0.15 mg/mg[Cre]  CBC   Collection Time: 01/16/16 11:12 PM  Result Value Ref Range   WBC 12.5 (H) 4.0 - 10.5 K/uL   RBC 3.84 (L) 3.87 - 5.11 MIL/uL   Hemoglobin 11.6 (L) 12.0 - 15.0 g/dL   HCT 95.6 (L) 21.3 - 08.6 %   MCV 88.5 78.0 - 100.0 fL   MCH 30.2 26.0 - 34.0 pg   MCHC 34.1 30.0 - 36.0 g/dL   RDW 57.8 46.9 - 62.9 %   Platelets 163 150 - 400 K/uL  Comprehensive metabolic panel   Collection Time: 01/16/16 11:12 PM  Result Value  Ref Range   Sodium 133 (L) 135 - 145 mmol/L   Potassium 3.6 3.5 - 5.1 mmol/L   Chloride 106 101 - 111 mmol/L   CO2 22 22 - 32 mmol/L   Glucose, Bld 102 (H) 65 - 99 mg/dL   BUN 8 6 - 20 mg/dL   Creatinine, Ser 5.28 0.44 - 1.00 mg/dL   Calcium 8.9 8.9 - 41.3 mg/dL  Total Protein 7.0 6.5 - 8.1 g/dL   Albumin 3.1 (L) 3.5 - 5.0 g/dL   AST 22 15 - 41 U/L   ALT 16 14 - 54 U/L   Alkaline Phosphatase 106 38 - 126 U/L   Total Bilirubin 0.4 0.3 - 1.2 mg/dL   GFR calc non Af Amer >60 >60 mL/min   GFR calc Af Amer >60 >60 mL/min   Anion gap 5 5 - 15    Patient Active Problem List   Diagnosis Date Noted  . Supervision of normal first pregnancy in third trimester 06/27/2015  . Migraine 09/25/2011    Assessment: Cynthia Crawford is a 25 y.o. G1P0000 at 9251w1d here for IOL for GHTN.  #Labor: Cervix currently 1.5 cm and soft. Will start induction with Cytotec and Foley bulb. #Pain: Wants epidural #FWB: Category I #ID: GBS neg #MOF: Breast #MOC: OCPs #Circ:  N/a- girl  Jinny BlossomKaty D Mayo 01/17/2016, 11:43 AM   OB fellow attestation: I have seen and examined this patient; I agree with above documentation in the resident's note.   Cynthia Crawford is a 25 y.o. G1P0000 here for IOL due to gestational HTN defined by new onset elevated BP > 6 hours apart without labs or sx concerning for preeclampsia.   PE: BP 148/95 mmHg  Pulse 87  Temp(Src) 98.5 F (36.9 C) (Oral)  Resp 18  Ht 5\' 3"  (1.6 m)  Wt 170 lb (77.111 kg)  BMI 30.12 kg/m2  LMP 05/02/2015 Gen: calm comfortable, NAD Resp: normal effort, no distress Abd: gravid  ROS, labs, PMH reviewed  Plan: Admit to LD Labor:  FB placed, will continue cytotec q4 hrs FWB:Cat I ID: GBS [id number]  Federico FlakeKimberly Niles Newton, MD Family Medicine, OB Fellow 01/17/2016, 11:53 AM

## 2016-01-18 ENCOUNTER — Encounter (HOSPITAL_COMMUNITY): Payer: Self-pay | Admitting: *Deleted

## 2016-01-18 DIAGNOSIS — Z3A37 37 weeks gestation of pregnancy: Secondary | ICD-10-CM

## 2016-01-18 DIAGNOSIS — O134 Gestational [pregnancy-induced] hypertension without significant proteinuria, complicating childbirth: Secondary | ICD-10-CM

## 2016-01-18 LAB — CBC
HEMATOCRIT: 34.6 % — AB (ref 36.0–46.0)
HEMOGLOBIN: 12.1 g/dL (ref 12.0–15.0)
MCH: 30.6 pg (ref 26.0–34.0)
MCHC: 35 g/dL (ref 30.0–36.0)
MCV: 87.6 fL (ref 78.0–100.0)
Platelets: 152 10*3/uL (ref 150–400)
RBC: 3.95 MIL/uL (ref 3.87–5.11)
RDW: 13.4 % (ref 11.5–15.5)
WBC: 22.3 10*3/uL — AB (ref 4.0–10.5)

## 2016-01-18 LAB — RPR: RPR Ser Ql: NONREACTIVE

## 2016-01-18 MED ORDER — BENZOCAINE-MENTHOL 20-0.5 % EX AERO
1.0000 "application " | INHALATION_SPRAY | CUTANEOUS | Status: DC | PRN
  Administered 2016-01-19: 1 via TOPICAL
  Filled 2016-01-18 (×2): qty 56

## 2016-01-18 MED ORDER — ONDANSETRON HCL 4 MG PO TABS: 4.0000 mg | ORAL_TABLET | ORAL | Status: DC | PRN

## 2016-01-18 MED ORDER — PRENATAL MULTIVITAMIN CH
1.0000 | ORAL_TABLET | Freq: Every day | ORAL | Status: DC
  Administered 2016-01-18 – 2016-01-19 (×2): 1 via ORAL
  Filled 2016-01-18 (×3): qty 1

## 2016-01-18 MED ORDER — ONDANSETRON HCL 4 MG/2ML IJ SOLN: 4.0000 mg | INTRAMUSCULAR | Status: DC | PRN

## 2016-01-18 MED ORDER — DIPHENHYDRAMINE HCL 25 MG PO CAPS: 25.0000 mg | ORAL_CAPSULE | Freq: Four times a day (QID) | ORAL | Status: DC | PRN

## 2016-01-18 MED ORDER — SIMETHICONE 80 MG PO CHEW: 80.0000 mg | CHEWABLE_TABLET | ORAL | Status: DC | PRN

## 2016-01-18 MED ORDER — DIBUCAINE 1 % RE OINT
1.0000 "application " | TOPICAL_OINTMENT | RECTAL | Status: DC | PRN
  Filled 2016-01-18: qty 28

## 2016-01-18 MED ORDER — WITCH HAZEL-GLYCERIN EX PADS: 1.0000 "application " | MEDICATED_PAD | CUTANEOUS | Status: DC | PRN

## 2016-01-18 MED ORDER — IBUPROFEN 600 MG PO TABS
600.0000 mg | ORAL_TABLET | Freq: Four times a day (QID) | ORAL | Status: DC
  Administered 2016-01-18 – 2016-01-19 (×5): 600 mg via ORAL
  Filled 2016-01-18 (×5): qty 1

## 2016-01-18 MED ORDER — ZOLPIDEM TARTRATE 5 MG PO TABS: 5.0000 mg | ORAL_TABLET | Freq: Every evening | ORAL | Status: DC | PRN

## 2016-01-18 MED ORDER — SENNOSIDES-DOCUSATE SODIUM 8.6-50 MG PO TABS
2.0000 | ORAL_TABLET | ORAL | Status: DC
  Administered 2016-01-19: 2 via ORAL
  Filled 2016-01-18: qty 2

## 2016-01-18 MED ORDER — COCONUT OIL OIL
1.0000 "application " | TOPICAL_OIL | Status: DC | PRN
  Administered 2016-01-19: 1 via TOPICAL
  Filled 2016-01-18 (×2): qty 120

## 2016-01-18 MED ORDER — ACETAMINOPHEN 325 MG PO TABS
650.0000 mg | ORAL_TABLET | ORAL | Status: DC | PRN
  Administered 2016-01-18: 650 mg via ORAL
  Filled 2016-01-18: qty 2

## 2016-01-18 MED ORDER — TETANUS-DIPHTH-ACELL PERTUSSIS 5-2.5-18.5 LF-MCG/0.5 IM SUSP
0.5000 mL | Freq: Once | INTRAMUSCULAR | Status: DC
Start: 2016-01-19 — End: 2016-01-18

## 2016-01-18 NOTE — Anesthesia Postprocedure Evaluation (Signed)
Anesthesia Post Note  Patient: Cynthia JarvisChasity Hargrave Crawford  Procedure(s) Performed: * No procedures listed *  Patient location during evaluation: Mother Baby Anesthesia Type: Epidural Level of consciousness: awake and alert and oriented Pain management: pain level controlled Vital Signs Assessment: post-procedure vital signs reviewed and stable Respiratory status: nonlabored ventilation and spontaneous breathing Cardiovascular status: stable Postop Assessment: patient able to bend at knees, no signs of nausea or vomiting and adequate PO intake Anesthetic complications: no     Last Vitals:  Filed Vitals:   01/18/16 1132 01/18/16 1329  BP: 133/69 134/74  Pulse: 80 82  Temp:    Resp:      Last Pain:  Filed Vitals:   01/18/16 1555  PainSc: 0-No pain   Pain Goal: Patients Stated Pain Goal: 7 (01/18/16 0228)               Laban EmperorMalinova,Teryn Boerema Hristova

## 2016-01-18 NOTE — Progress Notes (Signed)
LABOR PROGRESS NOTE  Cynthia Crawford is a 25 y.o. G1P0000 at 6663w2d  admitted for IOL for gHTN.  Subjective: Patient feeling nauseated and pressure in her bottom.  Objective: BP 143/92 mmHg  Pulse 89  Temp(Src) 98.3 F (36.8 C) (Oral)  Resp 16  Ht 5\' 3"  (1.6 m)  Wt 170 lb (77.111 kg)  BMI 30.12 kg/m2  SpO2 100%  LMP 05/02/2015 or  Filed Vitals:   01/17/16 2155 01/17/16 2200 01/17/16 2230 01/18/16 0000  BP: 135/77 135/92  143/92  Pulse: 92 82  89  Temp:    98.3 F (36.8 C)  TempSrc:    Oral  Resp: 16 16 16    Height:      Weight:      SpO2: 100%       Dilation: Lip/rim Effacement (%): 100 Cervical Position: Anterior Station: +1 Presentation: Vertex Exam by:: Dr. Felipa FurnaceGarcia  Labs: Lab Results  Component Value Date   WBC 15.5* 01/17/2016   HGB 12.0 01/17/2016   HCT 35.2* 01/17/2016   MCV 88.2 01/17/2016   PLT 175 01/17/2016    Patient Active Problem List   Diagnosis Date Noted  . Supervision of normal first pregnancy in third trimester 06/27/2015  . Migraine 09/25/2011    Assessment / Plan: 25 y.o. G1P0000 at 4963w2d here for IOL for gHTN.  Labor: Patient with anterior lip, +1 station.  Changing position now and continue pitocin. Fetal Wellbeing:  Category 2 tracing with variable decels but overall reassuring with moderate variability. Pain Control:  Epidural Anticipated MOD:  Anticipate SVD  Caesar ChestnutKelly E Bonniejean Piano, MD 01/18/2016, 2:38 AM

## 2016-01-18 NOTE — Progress Notes (Signed)
MOB was referred for history of depression/anxiety.  Referral is screened out by Clinical Social Worker because none of the following criteria appear to apply: -History of anxiety/depression during this pregnancy, or of post-partum depression. - Diagnosis of anxiety and/or depression within last 3 years or -MOB's symptoms are currently being treated with medication and/or therapy.  Please contact the Clinical Social Worker if needs arise or upon MOB request.  

## 2016-01-18 NOTE — Lactation Note (Signed)
This note was copied from a baby's chart. Lactation Consultation Note; Initial visit with mom, baby now 6 hours old and mom reports she has had 2 good feedings. Reports she feels tugging, not really pain with latch. Baby last fed about 1 1/2 hours ago and is asleep in dad's arms. BF brochure given and BFSG and OP appointments reviewed with mom as resources for support after DC. Reviewed feeding cues and encouraged to feed whenever she sees them. Encouraged frequent skin to skin. Reviewed normal newborn behavior the first 24 hours. To call for assist prn  Patient Name: Cynthia Crawford WUJWJ'XToday's Date: 01/18/2016 Reason for consult: Initial assessment   Maternal Data Formula Feeding for Exclusion: No Does the patient have breastfeeding experience prior to this delivery?: No  Feeding   LATCH Score/Interventions                      Lactation Tools Discussed/Used     Consult Status Consult Status: Follow-up Date: 01/19/16 Follow-up type: In-patient    Pamelia HoitWeeks, Cooper Stamp D 01/18/2016, 12:42 PM

## 2016-01-18 NOTE — Progress Notes (Signed)
LABOR PROGRESS NOTE  Cynthia Crawford is a 25 y.o. G1P0000 at 7541w1d  admitted for IOL for gHTN.  Subjective: Reports feeling "different"  Objective: BP 135/92 mmHg  Pulse 82  Temp(Src) 97.9 F (36.6 C) (Oral)  Resp 16  Ht 5\' 3"  (1.6 m)  Wt 170 lb (77.111 kg)  BMI 30.12 kg/m2  SpO2 100%  LMP 05/02/2015 or  Filed Vitals:   01/17/16 2150 01/17/16 2155 01/17/16 2200 01/17/16 2230  BP: 155/79 135/77 135/92   Pulse: 87 92 82   Temp:      TempSrc:      Resp: 16 16 16 16   Height:      Weight:      SpO2: 100% 100%      Gen: alert, in NAD Chest: normal WOB Dilation: 9 Effacement (%): 100 Cervical Position: Anterior Station: 0 Presentation: Vertex Exam by:: Dr. Alvester MorinNewton  AROM, clear fluid.   Labs: Lab Results  Component Value Date   WBC 15.5* 01/17/2016   HGB 12.0 01/17/2016   HCT 35.2* 01/17/2016   MCV 88.2 01/17/2016   PLT 175 01/17/2016    Patient Active Problem List   Diagnosis Date Noted  . Supervision of normal first pregnancy in third trimester 06/27/2015  . Migraine 09/25/2011    Assessment / Plan: 25 y.o. G1P0000 at 5341w1d here for IOL for gHTN.  Labor: Foley out approx. 20:00 and pitocin started. AROM performed Fetal Wellbeing:  Category 1 tracing Pain Control:  S/p epidural Anticipated MOD:  Anticipate SVD  Federico FlakeKimberly Niles Kilyn Maragh, MD 01/18/2016, 12:19 AM

## 2016-01-19 MED ORDER — IBUPROFEN 600 MG PO TABS: 600.0000 mg | ORAL_TABLET | Freq: Four times a day (QID) | ORAL | Status: DC

## 2016-01-19 NOTE — Lactation Note (Signed)
This note was copied from a baby's chart. Lactation Consultation Note  Patient Name: Cynthia Lanae BoastChasity Crawford UJWJX'BToday's Date: 01/19/2016 Reason for consult: Follow-up assessment;Infant < 6lbs;Other (Comment) (early term at 37.2 wks. ) Mom reports baby recently BF, baby asleep. Mom contacting insurance about DEBP for home. Discussed 2 week rental program if needed. LC left phone number for Mom to call with next feeding before d/c home today.   Maternal Data    Feeding Feeding Type: Breast Fed Length of feed: 15 min  LATCH Score/Interventions                      Lactation Tools Discussed/Used     Consult Status Consult Status: Follow-up Date: 01/19/16 Follow-up type: In-patient    Alfred LevinsGranger, Gina Leblond Ann 01/19/2016, 1:33 PM

## 2016-01-19 NOTE — Discharge Instructions (Signed)

## 2016-01-19 NOTE — Progress Notes (Signed)
POSTPARTUM PROGRESS NOTE  Post Partum Day 1 Subjective:  Cynthia Crawford is a 25 y.o. G1P1001 6161w2d s/p SVD after IOL for gHTN.  No acute events overnight.  Pt denies problems with ambulating, voiding or po intake.  She denies nausea or vomiting.  Pain is well controlled. Lochia Moderate. She denies headache, blurry vision, RUQ pain, or SOB.  Objective: Blood pressure 115/38, pulse 73, temperature 97.4 F (36.3 C), temperature source Oral, resp. rate 18, height 5\' 3"  (1.6 m), weight 170 lb (77.111 kg), last menstrual period 05/02/2015, SpO2 100 %, unknown if currently breastfeeding.  Physical Exam:  General: alert, cooperative and no distress Lochia:normal flow Chest: CTAB Heart: RRR no m/r/g Abdomen: +BS, soft, nontender,  Uterine Fundus: firm, below umbilicus DVT Evaluation: No calf swelling or tenderness Extremities: trace pedal edema bilaterally   Recent Labs  01/17/16 2040 01/18/16 0727  HGB 12.0 12.1  HCT 35.2* 34.6*    Assessment/Plan:  ASSESSMENT: Cynthia Crawford is a 25 y.o. G1P1001 4961w2d s/p SVD after IOL for gHTN.  Plan for discharge home later today or tomorrow though will need f/u for BPs. BPs mild range and no s/s of pre-eclampsia.   LOS: 2 days   Caesar ChestnutKelly E Garcia 01/19/2016, 7:45 AM   I have seen and examined this patient and I agree with the above. See Discharge Summary. Endi Lagman 1:14 PM 01/20/2016

## 2016-01-19 NOTE — Lactation Note (Signed)
This note was copied from a baby's chart. Lactation Consultation Note  Patient Name: Cynthia Crawford ONGEX'BToday's Date: 01/19/2016 Reason for consult: Follow-up assessment;Infant < 6lbs;Other (Comment) (early term at 37.2 wks. ) 2 week pump rental completed. Demonstrated and assisted parents to supplement with formula via curved tipped syringe. Mom did not receive any EBM with pumping. Baby took 10 ml of Alimentum via finger feeding. FOB demonstrated [facility] to [facility]. Feeding plan discussed:  BF with feeding ques, but at least 8-12 times in 24 hours. Keep baby nursing for 30 minutes 1 breast per feeding or 15-20 minutes both breasts each feeding. FOB to give supplement per LPT guidelines starting with 10 ml each feeding today. Mom to post pump every 3 hours for 15 minutes to encourage milk production and to have EBM to supplement. Offered to schedule OP f/u for Mom, she will call if desires. If decides to use bottle to supplement advised DR. Brown #1. Encouraged to continue to supplement and pump till her milk comes in and baby's weight stabilizes. Encouraged to call for questions/concerns.   Maternal Data    Feeding Feeding Type: Formula Length of feed: 30 min  LATCH Score/Interventions Latch: Grasps breast easily, tongue down, lips flanged, rhythmical sucking. Intervention(s): Adjust position;Assist with latch;Breast massage;Breast compression  Audible Swallowing: A few with stimulation  Type of Nipple: Everted at rest and after stimulation  Comfort (Breast/Nipple): Soft / non-tender     Hold (Positioning): Assistance needed to correctly position infant at breast and maintain latch. Intervention(s): Breastfeeding basics reviewed;Support Pillows;Position options;Skin to skin  LATCH Score: 8  Lactation Tools Discussed/Used     Consult Status Consult Status: Follow-up Date: 01/19/16 Follow-up type: In-patient    Cynthia Crawford, Cynthia Crawford 01/19/2016, 4:13 PM

## 2016-01-19 NOTE — Discharge Summary (Signed)
OB Discharge Summary  Patient Name: Cynthia Crawford DOB: 06/19/1991 MRN: [id number]  Date of admission: 01/17/2016 Delivering MD: Cynthia Crawford   Date of discharge: 01/19/2016  Admitting diagnosis: INDUCTION Intrauterine pregnancy: [redacted]w[redacted]d     Secondary diagnosis:Principal Problem:   SVD (spontaneous vaginal delivery)  Additional problems: Pt had a few mildly elevated blood pressures to 143/83 in the 24 hours after delivery. BPs trended down on their own to 115/38.     Discharge diagnosis: Term Pregnancy Delivered                                                                     Post partum procedures:none  Augmentation: AROM, Pitocin, Cytotec and Foley Balloon  Complications: None  Hospital course:  Induction of Labor With Vaginal Delivery   25 y.o. yo G1P1001 at [redacted]w[redacted]d was admitted to the hospital 01/17/2016 for induction of labor.  Indication for induction: Gestational hypertension.  Patient had an uncomplicated labor course as follows: Membrane Rupture Time/Date: 12:11 AM ,01/18/2016   Intrapartum Procedures: Episiotomy: None [1]                                         Lacerations:  1st degree [2]  Patient had delivery of a Viable infant.  Information for the patient's newborn:  Cynthia, Crawford [[id number]]  Delivery Method: Vaginal, Spontaneous Delivery (Filed from Delivery Summary)   01/18/2016  Details of delivery can be found in separate delivery note.  Patient had a routine postpartum course. Patient is discharged home 01/19/2016.   Physical exam  Filed Vitals:   01/18/16 1132 01/18/16 1329 01/18/16 1720 01/19/16 0557  BP: 133/69 134/74 126/72 115/38  Pulse: 80 82 73   Temp:   98.8 F (37.1 C) 97.4 F (36.3 C)  TempSrc:   Oral Oral  Resp:   18 18  Height:      Weight:      SpO2:       General: alert, cooperative and no distress Lochia: appropriate Uterine Fundus: firm Incision: N/A DVT Evaluation: No evidence of DVT seen on physical  exam. Labs: Lab Results  Component Value Date   WBC 22.3* 01/18/2016   HGB 12.1 01/18/2016   HCT 34.6* 01/18/2016   MCV 87.6 01/18/2016   PLT 152 01/18/2016   CMP Latest Ref Rng 01/17/2016  Glucose 65 - 99 mg/dL 77  BUN 6 - 20 mg/dL 7  Creatinine 0.86 - 5.78 mg/dL 4.69  Sodium 629 - 528 mmol/L 134(L)  Potassium 3.5 - 5.1 mmol/L 3.9  Chloride 101 - 111 mmol/L 105  CO2 22 - 32 mmol/L 21(L)  Calcium 8.9 - 10.3 mg/dL 4.1(L)  Total Protein 6.5 - 8.1 g/dL 7.1  Total Bilirubin 0.3 - 1.2 mg/dL 2.4(M)  Alkaline Phos 38 - 126 U/L 120  AST 15 - 41 U/L 22  ALT 14 - 54 U/L 18    Discharge instruction: per After Visit Summary and "Baby and Me Booklet".  After Visit Meds:    Medication List    TAKE these medications        cetirizine 10 MG tablet  Commonly  known as:  ZYRTEC  Take 10 mg by mouth daily.     ibuprofen 600 MG tablet  Commonly known as:  ADVIL,MOTRIN  Take 1 tablet (600 mg total) by mouth every 6 (six) hours.     multivitamin-prenatal 27-0.8 MG Tabs tablet  Take 1 tablet by mouth daily at 12 noon.        Diet: routine diet  Activity: Advance as tolerated. Pelvic rest for 6 weeks.   Outpatient follow up:6 weeks Follow up Appt:Future Appointments Date Time Provider Department Center  02/27/2016 1:15 PM Cynthia BossierMyra C Dove, MD [facility]   Follow up visit: No Follow-up on file.  Postpartum contraception: Combination OCPs  Newborn Data: Live born female  Birth Weight: 5 lb 13.5 oz (2651 g) APGAR: 8, 9  Baby Feeding: Breast Disposition:home with mother   01/19/2016 Cynthia SinclairKaty D Mayo, MD   CNM attestation I have seen and examined this patient and agree with above documentation in the resident's note.   Cynthia Crawford is a 25 y.o. G1P1001 s/p SVD.   Pain is well controlled.  Plan for birth control is oral progesterone-only contraceptive.  Method of Feeding: breast  PE:  BP 115/38 mmHg  Pulse 73  Temp(Src) 97.4 F (36.3 C) (Oral)  Resp 18  Ht  5\' 3"  (1.6 m)  Wt 77.111 kg (170 lb)  BMI 30.12 kg/m2  SpO2 100%  LMP 05/02/2015  Breastfeeding? Unknown Fundus firm   Recent Labs  01/17/16 2040 01/18/16 0727  HGB 12.0 12.1  HCT 35.2* 34.6*     Plan: discharge today - postpartum care discussed - f/u clinic in 6 weeks for postpartum visit   Cynthia Crawford, CNM 11:48 AM

## 2016-01-19 NOTE — Lactation Note (Signed)
This note was copied from a baby's chart. Lactation Consultation Note  Patient Name: Cynthia Lanae BoastChasity Dilger UUVOZ'DToday's Date: 01/19/2016 Reason for consult: Follow-up assessment;Infant < 6lbs;Other (Comment) (early term at 37.2 wks. ) Mom called for assist with latch. Mom had baby latched in cradle hold but baby not sustaining depth, more non-nutritive than nutritive suckling observed.  Assisted Mom with adjusting baby to cross cradle. Baby developed a more nutritive pattern but sleepy at the breast. LC discussed with parents early term status and milk transfer. It was decided to start supplementing baby with EBM or formula. Set up DEBP for Mom to use to post pump to try EBM 1st, drop received with hand expression. Will f/u after Mom has chance to pump. Parents plan 2 week pump rental. Paper given to complete.   Maternal Data    Feeding Feeding Type: Formula Length of feed: 30 min  LATCH Score/Interventions Latch: Grasps breast easily, tongue down, lips flanged, rhythmical sucking. Intervention(s): Adjust position;Assist with latch;Breast massage;Breast compression  Audible Swallowing: A few with stimulation  Type of Nipple: Everted at rest and after stimulation  Comfort (Breast/Nipple): Soft / non-tender     Hold (Positioning): Assistance needed to correctly position infant at breast and maintain latch. Intervention(s): Breastfeeding basics reviewed;Support Pillows;Position options;Skin to skin  LATCH Score: 8  Lactation Tools Discussed/Used     Consult Status Consult Status: Follow-up Date: 01/19/16 Follow-up type: In-patient    Alfred LevinsGranger, Raynette Arras Ann 01/19/2016, 4:09 PM

## 2016-01-20 ENCOUNTER — Ambulatory Visit: Payer: Self-pay

## 2016-01-20 NOTE — Lactation Note (Signed)
This note was copied from a baby's chart. Lactation Consultation Note  Patient Name: Cynthia Crawford GNFAO'ZToday's Date: 01/20/2016 Reason for consult: Follow-up assessment;Infant < 6lbs;Other (Comment) (early term baby at 37.2 wks.) Baby now at 8% weight loss. Mom has not been supplementing with each feeding. Mom reports she is pumping. Possible d/c today, Peds encouraging parents to stay another night, parents wanting to go home today. Baby recently fed, LC left phone number for Mom to call with next feeding for assist.   Maternal Data    Feeding Feeding Type: Formula Length of feed: 15 min  LATCH Score/Interventions Latch: Grasps breast easily, tongue down, lips flanged, rhythmical sucking. Intervention(s): Adjust position;Assist with latch;Breast massage  Audible Swallowing: A few with stimulation  Type of Nipple: Everted at rest and after stimulation  Comfort (Breast/Nipple): Filling, red/small blisters or bruises, mild/mod discomfort     Hold (Positioning): Assistance needed to correctly position infant at breast and maintain latch.  LATCH Score: 7  Lactation Tools Discussed/Used Pump Review: Setup, frequency, and cleaning;Milk Storage Initiated by:: Lactation Consultant Date initiated:: 01/19/16   Consult Status Consult Status: Follow-up Date: 01/20/16 Follow-up type: In-patient    Alfred LevinsGranger, [name] 01/20/2016, 1:44 PM

## 2016-01-23 ENCOUNTER — Encounter: Payer: BLUE CROSS/BLUE SHIELD | Admitting: Family Medicine

## 2016-01-30 ENCOUNTER — Encounter: Payer: BLUE CROSS/BLUE SHIELD | Admitting: Obstetrics & Gynecology

## 2016-01-31 ENCOUNTER — Encounter: Payer: Self-pay | Admitting: *Deleted

## 2016-02-01 ENCOUNTER — Encounter: Payer: Self-pay | Admitting: *Deleted

## 2016-02-06 ENCOUNTER — Encounter: Payer: BLUE CROSS/BLUE SHIELD | Admitting: Obstetrics and Gynecology

## 2016-02-15 ENCOUNTER — Ambulatory Visit (INDEPENDENT_AMBULATORY_CARE_PROVIDER_SITE_OTHER): Payer: BLUE CROSS/BLUE SHIELD | Admitting: Physician Assistant

## 2016-02-15 ENCOUNTER — Encounter: Payer: Self-pay | Admitting: Physician Assistant

## 2016-02-15 VITALS — BP 136/88 | HR 58 | Temp 98.8°F | Resp 16 | Wt 145.0 lb

## 2016-02-15 DIAGNOSIS — I10 Essential (primary) hypertension: Secondary | ICD-10-CM | POA: Diagnosis not present

## 2016-02-15 MED ORDER — ENALAPRIL MALEATE 5 MG PO TABS: 5.0000 mg | ORAL_TABLET | Freq: Every day | ORAL | Status: DC

## 2016-02-15 NOTE — Patient Instructions (Signed)
Hypertension During Pregnancy Hypertension, or high blood pressure, is when there is extra pressure inside your blood vessels that carry blood from the heart to the rest of your body (arteries). It can happen at any time in life, including pregnancy. Hypertension during pregnancy can cause problems for you and your baby. Your baby might not weigh as much as he or she should at birth or might be born early (premature). Very bad cases of hypertension during pregnancy can be life-threatening.  Different types of hypertension can occur during pregnancy. These include:  Chronic hypertension. This happens when a woman has hypertension before pregnancy and it continues during pregnancy.  Gestational hypertension. This is when hypertension develops during pregnancy.  Preeclampsia or toxemia of pregnancy. This is a very serious type of hypertension that develops only during pregnancy. It affects the whole body and can be very dangerous for both mother and baby.  Gestational hypertension and preeclampsia usually go away after your baby is born. Your blood pressure will likely stabilize within 6 weeks. Women who have hypertension during pregnancy have a greater chance of developing hypertension later in life or with future pregnancies. RISK FACTORS There are certain factors that make it more likely for you to develop hypertension during pregnancy. These include:  Having hypertension before pregnancy.  Having hypertension during a previous pregnancy.  Being overweight.  Being older than 40 years.  Being pregnant with more than one baby.  Having diabetes or kidney problems. SIGNS AND SYMPTOMS Chronic and gestational hypertension rarely cause symptoms. Preeclampsia has symptoms, which may include:  Increased protein in your urine. Your health care provider will check for this at every prenatal visit.  Swelling of your hands and face.  Rapid weight gain.  Headaches.  Visual changes.  Being  bothered by light.  Abdominal pain, especially in the upper right area.  Chest pain.  Shortness of breath.  Increased reflexes.  Seizures. These occur with a more severe form of preeclampsia, called eclampsia. DIAGNOSIS  You may be diagnosed with hypertension during a regular prenatal exam. At each prenatal visit, you may have:  Your blood pressure checked.  A urine test to check for protein in your urine. The type of hypertension you are diagnosed with depends on when you developed it. It also depends on your specific blood pressure reading.  Developing hypertension before 20 weeks of pregnancy is consistent with chronic hypertension.  Developing hypertension after 20 weeks of pregnancy is consistent with gestational hypertension.  Hypertension with increased urinary protein is diagnosed as preeclampsia.  Blood pressure measurements that stay above 160 systolic or 110 diastolic are a sign of severe preeclampsia. TREATMENT Treatment for hypertension during pregnancy varies. Treatment depends on the type of hypertension and how serious it is.  If you take medicine for chronic hypertension, you may need to switch medicines.  Medicines called ACE inhibitors should not be taken during pregnancy.  Low-dose aspirin may be suggested for women who have risk factors for preeclampsia.  If you have gestational hypertension, you may need to take a blood pressure medicine that is safe during pregnancy. Your health care provider will recommend the correct medicine.  If you have severe preeclampsia, you may need to be in the hospital. Health care providers will watch you and your baby very closely. You also may need to take medicine called magnesium sulfate to prevent seizures and lower blood pressure.  Sometimes, an early delivery is needed. This may be the case if the condition worsens. It would be   done to protect you and your baby. The only cure for preeclampsia is delivery.  Your health  care provider may recommend that you take one low-dose aspirin (81 mg) each day to help prevent high blood pressure during your pregnancy if you are at risk for preeclampsia. You may be at risk for preeclampsia if:  You had preeclampsia or eclampsia during a previous pregnancy.  Your baby did not grow as expected during a previous pregnancy.  You experienced preterm birth with a previous pregnancy.  You experienced a separation of the placenta from the uterus (placental abruption) during a previous pregnancy.  You experienced the loss of your baby during a previous pregnancy.  You are pregnant with more than one baby.  You have other medical conditions, such as diabetes or an autoimmune disease. HOME CARE INSTRUCTIONS  Schedule and keep all of your regular prenatal care appointments. This is important.  Take medicines only as directed by your health care provider. Tell your health care provider about all medicines you take.  Eat as little salt as possible.  Get regular exercise.  Do not drink alcohol.  Do not use tobacco products.  Do not drink products with caffeine.  Lie on your left side when resting. SEEK IMMEDIATE MEDICAL CARE IF:  You have severe abdominal pain.  You have sudden swelling in your hands, ankles, or face.  You gain 4 pounds (1.8 kg) or more in 1 week.  You vomit repeatedly.  You have vaginal bleeding.  You do not feel your baby moving as much.  You have a headache.  You have blurred or double vision.  You have muscle twitching or spasms.  You have shortness of breath.  You have blue fingernails or lips.  You have blood in your urine. MAKE SURE YOU:  Understand these instructions.  Will watch your condition.  Will get help right away if you are not doing well or get worse.   This information is not intended to replace advice given to you by your health care provider. Make sure you discuss any questions you have with your health care  provider.   Document Released: 05/22/2011 Document Revised: 09/24/2014 Document Reviewed: 04/02/2013 Elsevier Interactive Patient Education 2016 Elsevier Inc[employer] Enalapril tablets What is this medicine? ENALAPRIL (e NAL a pril) is an ACE inhibitor. This medicine is used to treat high blood pressure and heart failure. This medicine may be used for other purposes; ask your health care provider or pharmacist if you have questions. What should I tell my health care provider before I take this medicine? They need to know if you have any of these conditions: -bone marrow disease -heart or blood vessel disease -if you are on a special diet, such as a low salt diet -immune system disease like lupus -kidney or liver disease -low blood pressure -previous swelling of the tongue, face, or lips with difficulty breathing, difficulty swallowing, hoarseness, or tightening of the throat -an unusual or allergic reaction to enalapril, other ACE inhibitors, insect venom, foods, dyes, or preservatives -pregnant or trying to get pregnant -breast-feeding How should I use this medicine? Take this medicine by mouth with a glass of water. Follow the directions on the prescription label. Take your doses at regular intervals. Do not take your medicine more often than directed. Do not stop taking this medicine except on the advice of your doctor or health care professional. Talk to your pediatrician regarding the use of this medicine in children. Special care may be needed. While  this drug may be prescribed for children as young as 1 month, precautions do apply. Overdosage: If you think you have taken too much of this medicine contact a poison control center or emergency room at once. NOTE: This medicine is only for you. Do not share this medicine with others. What if I miss a dose? If you miss a dose, take it as soon as you can. If it is almost time for your next dose, take only that dose. Do not take double or extra  doses. What may interact with this medicine? -diuretics -lithium -medicines for high blood pressure -NSAIDs, medicines for pain and inflammation, like ibuprofen or naproxen -potassium salts or potassium supplements This list may not describe all possible interactions. Give your health care provider a list of all the medicines, herbs, non-prescription drugs, or dietary supplements you use. Also tell them if you smoke, drink alcohol, or use illegal drugs. Some items may interact with your medicine. What should I watch for while using this medicine? Visit your doctor or health care professional for regular checks on your progress. Check your blood pressure as directed. Ask your doctor or health care professional what your blood pressure should be and when you should contact him or her. Call your doctor or health care professional if you notice an irregular or fast heart beat. You may get drowsy or dizzy. Do not drive, use machinery, or do anything that needs mental alertness until you know how this drug affects you. Do not stand or sit up quickly, especially if you are an older patient. This reduces the risk of dizzy or fainting spells. Alcohol can make you more drowsy and dizzy. Avoid alcoholic drinks. Women should inform their doctor if they wish to become pregnant or think they might be pregnant. There is a potential for serious side effects to an unborn child. Talk to your health care professional or pharmacist for more information. Check with your doctor or health care professional if you get an attack of severe diarrhea, nausea and vomiting, or if you sweat a lot. The loss of too much body fluid can make it dangerous for you to take this medicine. Avoid salt substitutes unless you are told otherwise by your doctor or health care professional. Do not treat yourself for coughs, colds, or pain while you are taking this medicine without asking your doctor or health care professional for advice. Some  ingredients may increase your blood pressure. What side effects may I notice from receiving this medicine? Side effects that you should report to your doctor or health care professional as soon as possible: -allergic reactions like skin rash, itching or hives, swelling of the face, lips, or tongue -breathing problems -change in amount of urine passed -chest pain -feeling faint or lightheaded, falls -fever or chills -numbness or tingling in your fingers or toes -redness, blistering, peeling or loosening of the skin, including inside the mouth -swelling of ankles, legs -unusual bleeding or bruising or pinpoint red spots on the skin Side effects that usually do not require medical attention (report to your doctor or health care professional if they continue or are bothersome): -change in sex drive or performance -cough -sun sensitivity -tiredness This list may not describe all possible side effects. Call your doctor for medical advice about side effects. You may report side effects to FDA at 1-800-FDA-1088. Where should I keep my medicine? Keep out of the reach of children. Store at room temperature below 30 degrees C (86 degrees F). Protect from  moisture. Keep container tightly closed. Throw away any unused medicine after the expiration date. NOTE: This sheet is a summary. It may not cover all possible information. If you have questions about this medicine, talk to your doctor, pharmacist, or health care provider.    2016, Elsevier/Gold Standard. (2008-01-05 17:37:55)

## 2016-02-15 NOTE — Progress Notes (Signed)
Patient ID: Cynthia JarvisChasity Hargrave Olshefski, female   DOB: July 30, 1991, 25 y.o.   MRN: [id number]       Patient: Cynthia Crawford Female    DOB: July 30, 1991   25 y.o.   MRN: [id number] Visit Date: 02/15/2016  Today's Provider: Margaretann LovelessJennifer M Theotis Gerdeman, PA-C   Chief Complaint  Patient presents with  . elevated blood pressure post-partum   Subjective:    HPI Patient is here to follow up on her blood pressure. She is 4 weeks post-partum and reports that her BP has been in the 160s/90s prior to delivery. Was 146/97 on Monday (5/29) and 137/88 yesterday (5/30). Denies chest pain, shortness of breath, dyspnea on exertion, lower extremity edema or swelling, headaches, or visual changes.    Allergies  Allergen Reactions  . Penicillins Hives    Has patient had a PCN reaction causing immediate rash, facial/tongue/throat swelling, SOB or lightheadedness with hypotension: Yes Has patient had a PCN reaction causing severe rash involving mucus membranes or skin necrosis: No Has patient had a PCN reaction that required hospitalization No Has patient had a PCN reaction occurring within the last 10 years: No If all of the above answers are "NO", then may proceed with Cephalosporin use.  . Sulfa Antibiotics Hives   Previous Medications   CETIRIZINE (ZYRTEC) 10 MG TABLET    Take 10 mg by mouth daily.   IBUPROFEN (ADVIL,MOTRIN) 600 MG TABLET    Take 1 tablet (600 mg total) by mouth every 6 (six) hours.   PRENATAL VIT-FE FUMARATE-FA (MULTIVITAMIN-PRENATAL) 27-0.8 MG TABS TABLET    Take 1 tablet by mouth daily at 12 noon.     Review of Systems  Constitutional: Negative.   Eyes: Negative for visual disturbance.  Respiratory: Negative.   Cardiovascular: Negative.   Neurological: Negative for dizziness, tremors, seizures, syncope, weakness, light-headedness, numbness and headaches.    Social History  Substance Use Topics  . Smoking status: Never Smoker   . Smokeless tobacco: Never Used  . Alcohol Use:  No   Objective:   BP 136/88 mmHg  Pulse 58  Temp(Src) 98.8 F (37.1 C)  Resp 16  Wt 145 lb (65.772 kg)  SpO2 98%  Physical Exam  Constitutional: She appears well-developed and well-nourished. No distress.  Neck: Normal range of motion. Neck supple. No tracheal deviation present. No thyromegaly present.  Cardiovascular: Normal rate, regular rhythm and normal heart sounds.  Exam reveals no gallop and no friction rub.   No murmur heard. Pulmonary/Chest: Effort normal and breath sounds normal. No respiratory distress. She has no wheezes. She has no rales.  Musculoskeletal: She exhibits no edema.  Lymphadenopathy:    She has no cervical adenopathy.  Skin: She is not diaphoretic.  Vitals reviewed.       Assessment & Plan:     1. Essential hypertension Will start with enalapril 5 mg once daily as below as is documented safe for postpartum breast-feeding per the NIH website. She is to call the office if she has any adverse reactions to the medication. She is sitting either see myself or Joanie Coddingtonennis Crissman, PA-C in 1-2 weeks for repeat blood pressure check and to make sure she is tolerating this medication well. She follows up with her OB/GYN, Nicholaus BloomMyra Dove, on 02/27/2015. - enalapril (VASOTEC) 5 MG tablet; Take 1 tablet (5 mg total) by mouth daily.  Dispense: 30 tablet; Refill: 0       Margaretann LovelessJennifer M Judene Logue, PA-C  [facility]

## 2016-02-20 ENCOUNTER — Ambulatory Visit (INDEPENDENT_AMBULATORY_CARE_PROVIDER_SITE_OTHER): Payer: BLUE CROSS/BLUE SHIELD | Admitting: Family Medicine

## 2016-02-20 ENCOUNTER — Encounter: Payer: Self-pay | Admitting: Family Medicine

## 2016-02-20 VITALS — BP 142/98 | HR 68 | Temp 99.1°F | Resp 16 | Wt 145.0 lb

## 2016-02-20 DIAGNOSIS — I10 Essential (primary) hypertension: Secondary | ICD-10-CM

## 2016-02-20 NOTE — Progress Notes (Signed)
Patient ID: Cynthia Crawford Deguia, female   DOB: 1991-08-27, 25 y.o.   MRN: [id number]       Patient: Cynthia Crawford Cressey Female    DOB: 1991-08-27   25 y.o.   MRN: [id number] Visit Date: 02/20/2016  Today's Provider: Dortha Kernennis Chrismon, PA   Chief Complaint  Patient presents with  . Hypertension    1 week FU.  Marland Kitchen. Cough   Subjective:    Hypertension The problem is unchanged. Associated symptoms include headaches. Pertinent negatives include no blurred vision, chest pain, palpitations, peripheral edema or shortness of breath.  Cough This is a new problem. The current episode started 1 to 4 weeks ago. The problem has been unchanged. The cough is non-productive. Associated symptoms include headaches. Pertinent negatives include no chest pain, fever, nasal congestion, postnasal drip, shortness of breath or wheezing. Nothing aggravates the symptoms. She has tried nothing for the symptoms.   Patient was seen on 02/15/2016 and was started on enalapril 5mg . She reports that she only took 1 dose of the medication. She reports that she only took it because her DBP was >100. She reports that she stopped taking medication because she read that it is not safe to take while breastfeeding. She reports that she does still have headaches, and now she has developed a cough.    Patient Active Problem List   Diagnosis Date Noted  . SVD (spontaneous vaginal delivery) 01/18/2016  . Supervision of normal first pregnancy in third trimester 06/27/2015  . Migraine 09/25/2011   Past Surgical History  Procedure Laterality Date  . Tonsillectomy     Family History  Problem Relation Age of Onset  . Diabetes Father   . Asthma Mother     Allergies  Allergen Reactions  . Penicillins Hives    Has patient had a PCN reaction causing immediate rash, facial/tongue/throat swelling, SOB or lightheadedness with hypotension: Yes Has patient had a PCN reaction causing severe rash involving mucus membranes or skin  necrosis: No Has patient had a PCN reaction that required hospitalization No Has patient had a PCN reaction occurring within the last 10 years: No If all of the above answers are "NO", then may proceed with Cephalosporin use.  . Sulfa Antibiotics Hives   Previous Medications   CETIRIZINE (ZYRTEC) 10 MG TABLET    Take 10 mg by mouth daily.   ENALAPRIL (VASOTEC) 5 MG TABLET    Take 1 tablet (5 mg total) by mouth daily.   IBUPROFEN (ADVIL,MOTRIN) 600 MG TABLET    Take 1 tablet (600 mg total) by mouth every 6 (six) hours.   PRENATAL VIT-FE FUMARATE-FA (MULTIVITAMIN-PRENATAL) 27-0.8 MG TABS TABLET    Take 1 tablet by mouth daily at 12 noon.     Review of Systems  Constitutional: Negative for fever.  HENT: Negative for postnasal drip.   Eyes: Negative for blurred vision.  Respiratory: Positive for cough. Negative for shortness of breath and wheezing.   Cardiovascular: Negative for chest pain and palpitations.  Neurological: Positive for headaches.    Social History  Substance Use Topics  . Smoking status: Never Smoker   . Smokeless tobacco: Never Used  . Alcohol Use: No   Objective:   BP 142/98 mmHg  Pulse 68  Temp(Src) 99.1 F (37.3 C)  Resp 16  Wt 145 lb (65.772 kg)  SpO2 99%  Breastfeeding? Yes  Physical Exam  Constitutional: She is oriented to person, place, and time. She appears well-developed and well-nourished. No distress.  HENT:  Head:  Normocephalic and atraumatic.  Right Ear: Hearing normal.  Left Ear: Hearing normal.  Nose: Nose normal.  Eyes: Conjunctivae and lids are normal. Right eye exhibits no discharge. Left eye exhibits no discharge. No scleral icterus.  Neck: Neck supple.  Cardiovascular: Normal rate and regular rhythm.   Pulmonary/Chest: Effort normal and breath sounds normal. No respiratory distress.  Abdominal: Soft. Bowel sounds are normal.  Musculoskeletal: Normal range of motion.  Neurological: She is alert and oriented to person, place, and time.   Skin: Skin is intact. No lesion and no rash noted.  Psychiatric: She has a normal mood and affect. Her speech is normal and behavior is normal. Thought content normal.      Assessment & Plan:     1. Essential hypertension Has been uncomfortable using the Enalapril for hypertension because she is breastfeeding her 31 month old. Has not been using it and still seeing elevation of BP at home (highest was 140/103). Recommend she use the Enalapril regularly and call report of BP reading in 3 weeks.       Dortha Kern, PA  [facility]

## 2016-02-22 ENCOUNTER — Ambulatory Visit: Payer: BLUE CROSS/BLUE SHIELD | Admitting: Physician Assistant

## 2016-02-27 ENCOUNTER — Ambulatory Visit (INDEPENDENT_AMBULATORY_CARE_PROVIDER_SITE_OTHER): Payer: BLUE CROSS/BLUE SHIELD | Admitting: Obstetrics & Gynecology

## 2016-02-27 ENCOUNTER — Encounter: Payer: Self-pay | Admitting: Obstetrics & Gynecology

## 2016-02-27 NOTE — Progress Notes (Signed)
Patient ID: Cynthia Crawford, female   DOB: 06/18/1991, 25 y.o.   MRN: [id number] Post Partum Exam  Cynthia Crawford is a 25 y.o. [id number] female who presents for a postpartum visit. She is 5 weeks postpartum following a spontaneous vaginal delivery. I have fully reviewed the prenatal and intrapartum course. The delivery was at 5375w2d gestational weeks.  Anesthesia: epidural. Postpartum course has been positive for HTN. Baby's course has been Unremarkable. Baby is feeding by both breast and bottle - Similac Alimentum. Bleeding brown. Bowel function is normal. Bladder function is normal. Patient is not sexually active. Contraception method - Desires OCP (estrogen/progesterone). Postpartum depression screening: negative - Score=6  The following portions of the patient's history were reviewed and updated as appropriate: allergies, current medications, past family history, past medical history, past social history, past surgical history and problem list.  Review of Systems Pertinent items are noted in HPI.   Objective:    BP 116/78 mmHg  Pulse 78  Resp 16  Ht 5\' 5"  (1.651 m)  Wt 211 lb (95.709 kg)  BMI 35.11 kg/m2  Breastfeeding? Yes  General:  alert   Breasts:  inspection negative, no nipple discharge or bleeding, no masses or nodularity palpable  Lungs: clear to auscultation bilaterally  Heart:  regular rate and rhythm, S1, S2 normal, no murmur, click, rub or gallop  Abdomen: soft, non-tender; bowel sounds normal; no masses,  no organomegaly   Vulva:  normal  Vagina: normal vagina  Cervix:  anteverted  Corpus: normal  Adnexa:  normal adnexa  Rectal Exam: Not performed.        Assessment:   Normal postpartum exam. Pap smear not done at today's visit.   Plan:    1. Contraception: She will consider the options we discussed and let me know

## 2016-03-13 ENCOUNTER — Other Ambulatory Visit: Payer: Self-pay | Admitting: Physician Assistant

## 2016-05-15 ENCOUNTER — Ambulatory Visit: Payer: BLUE CROSS/BLUE SHIELD | Admitting: Family Medicine

## 2016-05-17 ENCOUNTER — Encounter: Payer: Self-pay | Admitting: Family Medicine

## 2016-05-17 ENCOUNTER — Ambulatory Visit (INDEPENDENT_AMBULATORY_CARE_PROVIDER_SITE_OTHER): Payer: Managed Care, Other (non HMO) | Admitting: Family Medicine

## 2016-05-17 VITALS — BP 118/80 | HR 68 | Temp 98.1°F | Resp 14 | Wt 132.2 lb

## 2016-05-17 DIAGNOSIS — I10 Essential (primary) hypertension: Secondary | ICD-10-CM | POA: Diagnosis not present

## 2016-05-17 NOTE — Patient Instructions (Signed)
Hypertension Hypertension, commonly called high blood pressure, is when the force of blood pumping through your arteries is too strong. Your arteries are the blood vessels that carry blood from your heart throughout your body. A blood pressure reading consists of a higher number over a lower number, such as 110/72. The higher number (systolic) is the pressure inside your arteries when your heart pumps. The lower number (diastolic) is the pressure inside your arteries when your heart relaxes. Ideally you want your blood pressure below 120/80. Hypertension forces your heart to work harder to pump blood. Your arteries may become narrow or stiff. Having untreated or uncontrolled hypertension can cause heart attack, stroke, kidney disease, and other problems. RISK FACTORS Some risk factors for high blood pressure are controllable. Others are not.  Risk factors you cannot control include:   Race. You may be at higher risk if you are African American.  Age. Risk increases with age.  Gender. Men are at higher risk than women before age 45 years. After age 65, women are at higher risk than men. Risk factors you can control include:  Not getting enough exercise or physical activity.  Being overweight.  Getting too much fat, sugar, calories, or salt in your diet.  Drinking too much alcohol. SIGNS AND SYMPTOMS Hypertension does not usually cause signs or symptoms. Extremely high blood pressure (hypertensive crisis) may cause headache, anxiety, shortness of breath, and nosebleed. DIAGNOSIS To check if you have hypertension, your health care provider will measure your blood pressure while you are seated, with your arm held at the level of your heart. It should be measured at least twice using the same arm. Certain conditions can cause a difference in blood pressure between your right and left arms. A blood pressure reading that is higher than normal on one occasion does not mean that you need treatment. If  it is not clear whether you have high blood pressure, you may be asked to return on a different day to have your blood pressure checked again. Or, you may be asked to monitor your blood pressure at home for 1 or more weeks. TREATMENT Treating high blood pressure includes making lifestyle changes and possibly taking medicine. Living a healthy lifestyle can help lower high blood pressure. You may need to change some of your habits. Lifestyle changes may include:  Following the DASH diet. This diet is high in fruits, vegetables, and whole grains. It is low in salt, red meat, and added sugars.  Keep your sodium intake below 2,300 mg per day.  Getting at least 30-45 minutes of aerobic exercise at least 4 times per week.  Losing weight if necessary.  Not smoking.  Limiting alcoholic beverages.  Learning ways to reduce stress. Your health care provider may prescribe medicine if lifestyle changes are not enough to get your blood pressure under control, and if one of the following is true:  You are 18-59 years of age and your systolic blood pressure is above 140.  You are 60 years of age or older, and your systolic blood pressure is above 150.  Your diastolic blood pressure is above 90.  You have diabetes, and your systolic blood pressure is over 140 or your diastolic blood pressure is over 90.  You have kidney disease and your blood pressure is above 140/90.  You have heart disease and your blood pressure is above 140/90. Your personal target blood pressure may vary depending on your medical conditions, your age, and other factors. HOME CARE INSTRUCTIONS    Have your blood pressure rechecked as directed by your health care provider.   Take medicines only as directed by your health care provider. Follow the directions carefully. Blood pressure medicines must be taken as prescribed. The medicine does not work as well when you skip doses. Skipping doses also puts you at risk for  problems.  Do not smoke.   Monitor your blood pressure at home as directed by your health care provider. SEEK MEDICAL CARE IF:   You think you are having a reaction to medicines taken.  You have recurrent headaches or feel dizzy.  You have swelling in your ankles.  You have trouble with your vision. SEEK IMMEDIATE MEDICAL CARE IF:  You develop a severe headache or confusion.  You have unusual weakness, numbness, or feel faint.  You have severe chest or abdominal pain.  You vomit repeatedly.  You have trouble breathing. MAKE SURE YOU:   Understand these instructions.  Will watch your condition.  Will get help right away if you are not doing well or get worse.   This information is not intended to replace advice given to you by your health care provider. Make sure you discuss any questions you have with your health care provider.   Document Released: 09/03/2005 Document Revised: 01/18/2015 Document Reviewed: 06/26/2013 Elsevier Interactive Patient Education 2016 Elsevier Inc.  

## 2016-05-17 NOTE — Progress Notes (Signed)
Patient: Cynthia Crawford Female    DOB: 08/23/1991   25 y.o.   MRN: [id number] Visit Date: 05/17/2016  Today's Provider: Dortha Kern, PA   Chief Complaint  Patient presents with  . Hypertension   Subjective:    HPI  Hypertension, follow-up:  BP Readings from Last 3 Encounters:  05/17/16 118/80  02/27/16 130/80  02/20/16 (!) 142/98    She was last seen for hypertension 2 months ago.  BP at that visit was 130/80. Management changes since that visit include continue Enalapril. She reports good compliance with treatment. She is not having side effects.  She is not exercising. She is adherent to low salt diet.   Outside blood pressures are being checked average is 120-140's/80-102's. She is experiencing none.  Patient denies none.   Cardiovascular risk factors include none.  Use of agents associated with hypertension: none.   Had early induction of delivery 01-18-16 due to hypertension.  Still taking Enalapril 5 mg qd.  Weight trend: stable Wt Readings from Last 3 Encounters:  05/17/16 132 lb 3.2 oz (60 kg)  02/27/16 140 lb 6.4 oz (63.7 kg)  02/20/16 145 lb (65.8 kg)    ------------------------------------------------------------------------ Past Medical History:  Diagnosis Date  . Anxiety   . Asthma   . Hypertension   . Pregnancy induced hypertension    . Past Surgical History:  Procedure Laterality Date  . TONSILLECTOMY     Family History  Problem Relation Age of Onset  . Diabetes Father   . Asthma Mother    Allergies  Allergen Reactions  . Penicillins Hives    Has patient had a PCN reaction causing immediate rash, facial/tongue/throat swelling, SOB or lightheadedness with hypotension: Yes Has patient had a PCN reaction causing severe rash involving mucus membranes or skin necrosis: No Has patient had a PCN reaction that required hospitalization No Has patient had a PCN reaction occurring within the last 10 years: No If all of the above  answers are "NO", then may proceed with Cephalosporin use.  . Sulfa Antibiotics Hives      Previous Medications   CETIRIZINE (ZYRTEC) 10 MG TABLET    Take 10 mg by mouth daily.   ENALAPRIL (VASOTEC) 5 MG TABLET    TAKE 1 TABLET (5 MG TOTAL) BY MOUTH DAILY.   IBUPROFEN (ADVIL,MOTRIN) 600 MG TABLET    Take 1 tablet (600 mg total) by mouth every 6 (six) hours.   PRENATAL VIT-FE FUMARATE-FA (MULTIVITAMIN-PRENATAL) 27-0.8 MG TABS TABLET    Take 1 tablet by mouth daily at 12 noon.     Review of Systems  Constitutional: Negative.   Respiratory: Negative.   Cardiovascular: Negative.     Social History  Substance Use Topics  . Smoking status: Never Smoker  . Smokeless tobacco: Never Used  . Alcohol use No   Objective:   BP 118/80 (BP Location: Right Arm, Patient Position: Sitting, Cuff Size: Normal)   Pulse 68   Temp 98.1 F (36.7 C) (Oral)   Resp 14   Wt 132 lb 3.2 oz (60 kg)   BMI 23.42 kg/m   Physical Exam  Constitutional: She is oriented to person, place, and time. She appears well-developed and well-nourished. No distress.  HENT:  Head: Normocephalic and atraumatic.  Right Ear: Hearing normal.  Left Ear: Hearing normal.  Nose: Nose normal.  Mouth/Throat: Oropharynx is clear and moist.  Eyes: Conjunctivae and lids are normal. Right eye exhibits no discharge. Left eye exhibits no discharge. No scleral icterus.  Neck: No thyromegaly present.  Cardiovascular: Normal rate and regular rhythm.   Pulmonary/Chest: Effort normal. No respiratory distress.  Abdominal: Soft. Bowel sounds are normal.  Musculoskeletal: Normal range of motion.  Neurological: She is alert and oriented to person, place, and time.  Skin: Skin is intact. No lesion and no rash noted.  Psychiatric: She has a normal mood and affect. Her speech is normal and behavior is normal. Thought content normal.   Assessment & Plan:     1. Essential hypertension Had started after delivery of daughter 01-18-16 due to  elevated BP. Some mild headaches when BP up at home now. Tolerating Enalapril 5 mg qd without side effects. Encouraged to limit salt, caffeine and no ETOH (breastfeeding now). Denies tobacco use or exposure. Check routine labs and follow up pending reports. - CBC with Differential/Platelet - Comprehensive metabolic panel - TSH

## 2016-05-18 ENCOUNTER — Telehealth: Payer: Self-pay

## 2016-05-18 LAB — CBC WITH DIFFERENTIAL/PLATELET
Basophils Absolute: 0 10*3/uL (ref 0.0–0.2)
Basos: 0 %
EOS (ABSOLUTE): 0.1 10*3/uL (ref 0.0–0.4)
EOS: 2 %
HEMATOCRIT: 39.3 % (ref 34.0–46.6)
HEMOGLOBIN: 13.2 g/dL (ref 11.1–15.9)
IMMATURE GRANS (ABS): 0 10*3/uL (ref 0.0–0.1)
Immature Granulocytes: 0 %
LYMPHS ABS: 1.9 10*3/uL (ref 0.7–3.1)
Lymphs: 33 %
MCH: 29.6 pg (ref 26.6–33.0)
MCHC: 33.6 g/dL (ref 31.5–35.7)
MCV: 88 fL (ref 79–97)
MONOCYTES: 6 %
Monocytes Absolute: 0.4 10*3/uL (ref 0.1–0.9)
NEUTROS ABS: 3.3 10*3/uL (ref 1.4–7.0)
Neutrophils: 59 %
Platelets: 205 10*3/uL (ref 150–379)
RBC: 4.46 x10E6/uL (ref 3.77–5.28)
RDW: 13.4 % (ref 12.3–15.4)
WBC: 5.7 10*3/uL (ref 3.4–10.8)

## 2016-05-18 LAB — COMPREHENSIVE METABOLIC PANEL
ALBUMIN: 4.6 g/dL (ref 3.5–5.5)
ALK PHOS: 62 IU/L (ref 39–117)
ALT: 9 IU/L (ref 0–32)
AST: 13 IU/L (ref 0–40)
Albumin/Globulin Ratio: 1.4 (ref 1.2–2.2)
BUN / CREAT RATIO: 15 (ref 9–23)
BUN: 11 mg/dL (ref 6–20)
Bilirubin Total: 0.3 mg/dL (ref 0.0–1.2)
CO2: 23 mmol/L (ref 18–29)
CREATININE: 0.71 mg/dL (ref 0.57–1.00)
Calcium: 9.7 mg/dL (ref 8.7–10.2)
Chloride: 100 mmol/L (ref 96–106)
GFR, EST AFRICAN AMERICAN: 137 mL/min/{1.73_m2} (ref >59)
GFR, EST NON AFRICAN AMERICAN: 119 mL/min/{1.73_m2} (ref >59)
GLOBULIN, TOTAL: 3.3 g/dL (ref 1.5–4.5)
Glucose: 79 mg/dL (ref 65–99)
Potassium: 4.4 mmol/L (ref 3.5–5.2)
SODIUM: 141 mmol/L (ref 134–144)
TOTAL PROTEIN: 7.9 g/dL (ref 6.0–8.5)

## 2016-05-18 LAB — TSH: TSH: 0.692 u[IU]/mL (ref 0.450–4.500)

## 2016-05-18 NOTE — Telephone Encounter (Signed)
-----   Message from Tamsen Roersennis E Chrismon, [place] sent at 05/18/2016  8:17 AM EDT ----- All lab tests normal. Continue present dosage of Lisinopril and checking BP at home. Record readings and recheck appointment in 1 month. Please bring BP cuff with you at that appointment to confirm calibration.

## 2016-05-18 NOTE — Telephone Encounter (Signed)
Left message to call back  

## 2016-05-23 NOTE — Telephone Encounter (Signed)
LMTCB

## 2016-05-23 NOTE — Telephone Encounter (Signed)
Patient advised.

## 2016-08-04 ENCOUNTER — Other Ambulatory Visit: Payer: Self-pay | Admitting: Family Medicine

## 2016-10-16 ENCOUNTER — Ambulatory Visit: Payer: Managed Care, Other (non HMO) | Admitting: Family Medicine

## 2016-10-17 ENCOUNTER — Ambulatory Visit (INDEPENDENT_AMBULATORY_CARE_PROVIDER_SITE_OTHER): Payer: 59

## 2016-10-17 ENCOUNTER — Telehealth: Payer: Self-pay

## 2016-10-17 DIAGNOSIS — Z23 Encounter for immunization: Secondary | ICD-10-CM

## 2016-10-17 NOTE — Telephone Encounter (Signed)
Patient was seen in office today to have flu vaccine administered, patient stated that she sent a refill request in for Enalapril 5mg  but was told by our office that she would have to come in to check her blood pressure. Patients blood pressure today was taken on the left arm using a small cuff b/p reading was 126/80, patients heart rate was 65 and resp. 16. Patient [facility] Patient was advised that Maurine MinisterDennis was out of office today and will address when he returns. KW

## 2016-10-18 ENCOUNTER — Other Ambulatory Visit: Payer: Self-pay | Admitting: Family Medicine

## 2016-10-18 DIAGNOSIS — I10 Essential (primary) hypertension: Secondary | ICD-10-CM

## 2016-10-18 MED ORDER — ENALAPRIL MALEATE 5 MG PO TABS: ORAL_TABLET | ORAL | 3 refills | Status: DC

## 2016-10-18 NOTE — Telephone Encounter (Signed)
Patient advised.

## 2016-10-18 NOTE — Telephone Encounter (Signed)
Sent refill to pharmacy at 8:17 am today. Advise patient to schedule follow up of blood pressure and lab work at an appointment in the office in 3 months.

## 2017-02-14 ENCOUNTER — Ambulatory Visit (INDEPENDENT_AMBULATORY_CARE_PROVIDER_SITE_OTHER): Payer: 59 | Admitting: Physician Assistant

## 2017-02-14 ENCOUNTER — Encounter: Payer: Self-pay | Admitting: Physician Assistant

## 2017-02-14 VITALS — BP 128/82 | HR 66 | Temp 98.8°F | Resp 16 | Ht 64.0 in | Wt 119.0 lb

## 2017-02-14 DIAGNOSIS — A084 Viral intestinal infection, unspecified: Secondary | ICD-10-CM | POA: Diagnosis not present

## 2017-02-14 DIAGNOSIS — E559 Vitamin D deficiency, unspecified: Secondary | ICD-10-CM

## 2017-02-14 MED ORDER — ONDANSETRON HCL 4 MG PO TABS: 4.0000 mg | ORAL_TABLET | Freq: Three times a day (TID) | ORAL | 0 refills | Status: DC | PRN

## 2017-02-14 NOTE — Patient Instructions (Signed)
Take 1000 IU of vitamin D daily.   Viral Gastroenteritis, Adult Viral gastroenteritis is also known as the stomach flu. This condition is caused by certain germs (viruses). These germs can be passed from person to person very easily (are very contagious). This condition can cause sudden watery poop (diarrhea), fever, and throwing up (vomiting). Having watery poop and throwing up can make you feel weak and cause you to get dehydrated. Dehydration can make you tired and thirsty, make you have a dry mouth, and make it so you pee (urinate) less often. Older adults and people with other diseases or a weak defense system (immune system) are at higher risk for dehydration. It is important to replace the fluids that you lose from having watery poop and throwing up. Follow these instructions at home: Follow instructions from your doctor about how to care for yourself at home. Eating and drinking  Follow these instructions as told by your doctor:  Take an oral rehydration solution (ORS). This is a drink that is sold at pharmacies and stores.  Drink clear fluids in small amounts as you are able, such as: ? Water. ? Ice chips. ? Diluted fruit juice. ? Low-calorie sports drinks.  Eat bland, easy-to-digest foods in small amounts as you are able, such as: ? Bananas. ? Applesauce. ? Rice. ? Low-fat (lean) meats. ? Toast. ? Crackers.  Avoid fluids that have a lot of sugar or caffeine in them.  Avoid alcohol.  Avoid spicy or fatty foods.  General instructions  Drink enough fluid to keep your pee (urine) clear or pale yellow.  Wash your hands often. If you cannot use soap and water, use hand sanitizer.  Make sure that all people in your home wash their hands well and often.  Rest at home while you get better.  Take over-the-counter and prescription medicines only as told by your doctor.  Watch your condition for any changes.  Take a warm bath to help with any burning or pain from having  watery poop.  Keep all follow-up visits as told by your doctor. This is important. Contact a doctor if:  You cannot keep fluids down.  Your symptoms get worse.  You have new symptoms.  You feel light-headed or dizzy.  You have muscle cramps. Get help right away if:  You have chest pain.  You feel very weak or you pass out (faint).  You see blood in your throw-up.  Your throw-up looks like coffee grounds.  You have bloody or black poop (stools) or poop that look like tar.  You have a very bad headache, a stiff neck, or both.  You have a rash.  You have very bad pain, cramping, or bloating in your belly (abdomen).  You have trouble breathing.  You are breathing very quickly.  Your heart is beating very quickly.  Your skin feels cold and clammy.  You feel confused.  You have pain when you pee.  You have signs of dehydration, such as: ? Dark pee, hardly any pee, or no pee. ? Cracked lips. ? Dry mouth. ? Sunken eyes. ? Sleepiness. ? Weakness. This information is not intended to replace advice given to you by your health care provider. Make sure you discuss any questions you have with your health care provider. Document Released: 02/20/2008 Document Revised: 03/23/2016 Document Reviewed: 05/10/2015 Elsevier Interactive Patient Education  2017 [employer]

## 2017-02-14 NOTE — Progress Notes (Signed)
Patient: Cynthia JarvisChasity Hargrave Josephs Female    DOB: 27-Dec-1990   26 y.o.   MRN: [id number] Visit Date: 02/14/2017  Today's Provider: Trey SailorsAdriana M Pollak, PA-C   Chief Complaint  Patient presents with  . Nausea   Subjective:    HPI   Patient comes in today c/o nausea, vomiting, and diarrhea X 2 days. Patient reports that the last time she vomited was yesterday afternoon. Vomited three times, nonbloody and nonbilious. Ate some chicken minis this morning with no vomiting. Was having watery, non-bloody diarrhea that has become more formed. Some intermittent abdominal cramping but no focal pain. Works in [employer]. She has been taking pro biotics with no relief. Immodium with some relief. Also asking about low vitamin D levels.    Allergies  Allergen Reactions  . Penicillins Hives    Has patient had a PCN reaction causing immediate rash, facial/tongue/throat swelling, SOB or lightheadedness with hypotension: Yes Has patient had a PCN reaction causing severe rash involving mucus membranes or skin necrosis: No Has patient had a PCN reaction that required hospitalization No Has patient had a PCN reaction occurring within the last 10 years: No If all of the above answers are "NO", then may proceed with Cephalosporin use.  . Sulfa Antibiotics Hives     Current Outpatient Prescriptions:  .  cetirizine (ZYRTEC) 10 MG tablet, Take 10 mg by mouth daily., Disp: , Rfl:  .  enalapril (VASOTEC) 5 MG tablet, TAKE 1 TABLET (5 MG TOTAL) BY MOUTH DAILY., Disp: 30 tablet, Rfl: 3 .  ibuprofen (ADVIL,MOTRIN) 600 MG tablet, Take 1 tablet (600 mg total) by mouth every 6 (six) hours., Disp: 30 tablet, Rfl: 0 .  Prenatal Vit-Fe Fumarate-FA (MULTIVITAMIN-PRENATAL) 27-0.8 MG TABS tablet, Take 1 tablet by mouth daily at 12 noon. , Disp: , Rfl:   Review of Systems  Constitutional: Positive for activity change, appetite change and fatigue.  Respiratory: Negative.   Cardiovascular: Negative.     Gastrointestinal: Positive for abdominal pain, diarrhea, nausea and vomiting.  Neurological: Positive for headaches.    Social History  Substance Use Topics  . Smoking status: Never Smoker  . Smokeless tobacco: Never Used  . Alcohol use No   Objective:   BP 128/82 (BP Location: Left Arm, Patient Position: Sitting, Cuff Size: Normal)   Pulse 66   Temp 98.8 F (37.1 C)   Resp 16   Ht 5\' 4"  (1.626 m)   Wt 119 lb (54 kg)   SpO2 99%   Breastfeeding? No   BMI 20.43 kg/m  Vitals:   02/14/17 1509  BP: 128/82  Pulse: 66  Resp: 16  Temp: 98.8 F (37.1 C)  SpO2: 99%  Weight: 119 lb (54 kg)  Height: 5\' 4"  (1.626 m)     Physical Exam  Constitutional: She is oriented to person, place, and time. She appears well-developed and well-nourished.  HENT:  Mouth/Throat: Mucous membranes are normal. Mucous membranes are not pale, not dry and not cyanotic.  Eyes: Conjunctivae are normal.  Abdominal: Soft. Bowel sounds are normal.  Neurological: She is alert and oriented to person, place, and time.  Skin: Skin is warm and dry.  Psychiatric: She has a normal mood and affect. Her behavior is normal.        Assessment & Plan:     1. Viral gastroenteritis   Push fluids.  - ondansetron (ZOFRAN) 4 MG tablet; Take 1 tablet (4 mg total) by mouth every 8 (eight) hours as  needed for nausea or vomiting.  Dispense: 20 tablet; Refill: 0  2. Vitamin D deficiency  Checked prior level, it was 20. Doesn't want redrawn today. Recommend 1000 IU daily.  Return if symptoms worsen or fail to improve.  The entirety of the information documented in the History of Present Illness, Review of Systems and Physical Exam were personally obtained by me. Portions of this information were initially documented by Anson [place], CMA and reviewed by me for thoroughness and accuracy.             Trey Sailors, PA-C  [facility]

## 2017-04-16 ENCOUNTER — Ambulatory Visit (INDEPENDENT_AMBULATORY_CARE_PROVIDER_SITE_OTHER): Payer: 59 | Admitting: Family Medicine

## 2017-04-16 ENCOUNTER — Encounter: Payer: Self-pay | Admitting: Family Medicine

## 2017-04-16 DIAGNOSIS — M546 Pain in thoracic spine: Secondary | ICD-10-CM | POA: Diagnosis not present
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
## 2017-08-07 ENCOUNTER — Ambulatory Visit (INDEPENDENT_AMBULATORY_CARE_PROVIDER_SITE_OTHER): Payer: 59 | Admitting: Physician Assistant

## 2017-08-07 ENCOUNTER — Encounter: Payer: Self-pay | Admitting: Physician Assistant

## 2017-08-07 VITALS — BP 144/84 | HR 72 | Temp 97.8°F | Resp 14 | Wt 122.0 lb

## 2017-08-07 DIAGNOSIS — J029 Acute pharyngitis, unspecified: Secondary | ICD-10-CM

## 2017-08-07 NOTE — Patient Instructions (Signed)

## 2017-08-07 NOTE — Progress Notes (Signed)
Patient: Cynthia JarvisChasity Hargrave Crawford Female    DOB: 25-Nov-1990   26 y.o.   MRN: [id number] Visit Date: 08/07/2017  Today's Provider: Trey SailorsAdriana M Pollak, PA-C   Chief Complaint  Patient presents with  . Sore Throat   Subjective:    HPI Pt reports that she feels like there id something stuck in her throat. It does not hurt until she swallows. She reports that her throat was this way earlier in the week and it went away. It came back this morning and was worse than it was before. She reports that she does not really feel bad. She has had some nausea last night. Denies any other cold symptoms. No fevers or chills, abdominal pain. She does not have tonsils.       Allergies  Allergen Reactions  . Penicillins Hives    Has patient had a PCN reaction causing immediate rash, facial/tongue/throat swelling, SOB or lightheadedness with hypotension: Yes Has patient had a PCN reaction causing severe rash involving mucus membranes or skin necrosis: No Has patient had a PCN reaction that required hospitalization No Has patient had a PCN reaction occurring within the last 10 years: No If all of the above answers are "NO", then may proceed with Cephalosporin use.  . Sulfa Antibiotics Hives     Current Outpatient Medications:  .  enalapril (VASOTEC) 5 MG tablet, TAKE 1 TABLET (5 MG TOTAL) BY MOUTH DAILY., Disp: 30 tablet, Rfl: 3 .  ibuprofen (ADVIL,MOTRIN) 600 MG tablet, Take 1 tablet (600 mg total) by mouth every 6 (six) hours., Disp: 30 tablet, Rfl: 0 .  cetirizine (ZYRTEC) 10 MG tablet, Take 10 mg by mouth daily., Disp: , Rfl:   Review of Systems  Constitutional: Negative.   HENT: Positive for sore throat.   Eyes: Negative.   Respiratory: Negative.   Cardiovascular: Negative.   Gastrointestinal: Positive for nausea.  Endocrine: Negative.   Genitourinary: Negative.   Musculoskeletal: Negative.   Skin: Negative.   Allergic/Immunologic: Negative.   Neurological: Negative.     Hematological: Negative.   Psychiatric/Behavioral: Negative.     Social History   Tobacco Use  . Smoking status: Never Smoker  . Smokeless tobacco: Never Used  Substance Use Topics  . Alcohol use: No   Objective:   BP (!) 144/84 (BP Location: Left Arm, Patient Position: Sitting, Cuff Size: Normal)   Pulse 72   Temp 97.8 F (36.6 C) (Oral)   Resp 14   Wt 122 lb (55.3 kg)   BMI 20.94 kg/m  Vitals:   08/07/17 1156  BP: (!) 144/84  Pulse: 72  Resp: 14  Temp: 97.8 F (36.6 C)  TempSrc: Oral  Weight: 122 lb (55.3 kg)     Physical Exam  Constitutional: She is oriented to person, place, and time. She appears well-developed and well-nourished.  HENT:  Right Ear: Tympanic membrane and external ear normal.  Left Ear: Tympanic membrane and external ear normal.  Mouth/Throat: Oropharynx is clear and moist. No oropharyngeal exudate.  Neck: Neck supple.  Cardiovascular: Normal rate and regular rhythm.  Pulmonary/Chest: Effort normal and breath sounds normal. No respiratory distress. She has no wheezes.  Lymphadenopathy:    She has cervical adenopathy.  Neurological: She is alert and oriented to person, place, and time.  Skin: Skin is warm and dry.  Psychiatric: She has a normal mood and affect. Her behavior is normal.        Assessment & Plan:     1.  Pharyngitis, unspecified etiology  Likely viral. Instructed on using NSAIDs for pain relief. Call back if not improving.  Return if symptoms worsen or fail to improve.  The entirety of the information documented in the History of Present Illness, Review of Systems and Physical Exam were personally obtained by me. Portions of this information were initially documented by EritreaBrittany O'Dell, CMA and reviewed by me for thoroughness and accuracy.             Trey SailorsAdriana M Pollak, PA-C  [facility]

## 2017-08-15 ENCOUNTER — Ambulatory Visit: Payer: 59 | Admitting: Physician Assistant

## 2017-08-15 ENCOUNTER — Ambulatory Visit (INDEPENDENT_AMBULATORY_CARE_PROVIDER_SITE_OTHER): Payer: 59 | Admitting: Physician Assistant

## 2017-08-15 DIAGNOSIS — Z23 Encounter for immunization: Secondary | ICD-10-CM | POA: Diagnosis not present

## 2017-08-28 ENCOUNTER — Encounter: Payer: Self-pay | Admitting: Family Medicine

## 2017-08-28 ENCOUNTER — Ambulatory Visit (INDEPENDENT_AMBULATORY_CARE_PROVIDER_SITE_OTHER): Payer: 59 | Admitting: Family Medicine

## 2017-08-28 ENCOUNTER — Telehealth: Payer: Self-pay

## 2017-08-28 VITALS — BP 122/70 | HR 64 | Temp 98.1°F | Resp 16 | Wt 123.0 lb

## 2017-08-28 DIAGNOSIS — I10 Essential (primary) hypertension: Secondary | ICD-10-CM

## 2017-08-28 DIAGNOSIS — M940 Chondrocostal junction syndrome [Tietze]: Secondary | ICD-10-CM

## 2017-08-28 DIAGNOSIS — R079 Chest pain, unspecified: Secondary | ICD-10-CM

## 2017-08-28 MED ORDER — NAPROXEN 500 MG PO TABS: 500.0000 mg | ORAL_TABLET | Freq: Two times a day (BID) | ORAL | 0 refills | Status: DC

## 2017-08-28 NOTE — Progress Notes (Signed)
Patient: Cynthia JarvisChasity Hargrave Tardif Female    DOB: 04-07-1991   26 y.o.   MRN: [id number] Visit Date: 08/28/2017  Today's Provider: Shirlee LatchAngela Bacigalupo, MD   Chief Complaint  Patient presents with  . Chest Pain   Subjective:    Chest Pain   This is a new problem. The current episode started in the past 7 days (Monday night of this week). The onset quality is gradual. The problem occurs 2 to 4 times per day. The problem has been waxing and waning (It worse at night. Per patient the pain woke her up last night). The pain is present in the epigastric region. The pain is at a severity of 6/10. The pain is moderate. The quality of the pain is described as dull. The pain radiates to the mid back. Associated symptoms include back pain, malaise/fatigue and palpitations. Pertinent negatives include no abdominal pain, cough, dizziness, exertional chest pressure, fever, headaches, irregular heartbeat, leg pain, lower extremity edema, nausea, near-syncope, numbness, shortness of breath, syncope, vomiting or weakness. The pain is aggravated by nothing. Treatments tried: Taking deep breath,Lavender oil and IBU. The treatment provided mild relief.  Pertinent negatives for past medical history include no anxiety/panic attacks and no sleep apnea.  Per patient she had an EKG done and used the Holter monitor for a week and everything was normal. She reports this was when she was in high school.   She had been shoveling snow around the time at this started.  Her pain is along lower L costal margin and medial to L scapula in he back.  Pain is worse with turning/twisting.  She only has palpitations after she starts worrying about the chest pain and what it could mean.  Not exertional.  Episode that really bothered her was after turning over in bed while sleeping.  HTN: - Medications: enalapril - Compliance: good, but does occasionally miss doses - Checking BP at home: no - Denies any SOB, CP, vision changes,  LE edema, medication SEs, or symptoms of hypotension - Diet: low sodium, "healthy" - she has always thought that BP was elevated during OVs due to anxiety.  She would like to not have to take medications. She did have gestational HTN and preeclampsia and hasn't been able to stop medications since then      Allergies  Allergen Reactions  . Penicillins Hives    Has patient had a PCN reaction causing immediate rash, facial/tongue/throat swelling, SOB or lightheadedness with hypotension: Yes Has patient had a PCN reaction causing severe rash involving mucus membranes or skin necrosis: No Has patient had a PCN reaction that required hospitalization No Has patient had a PCN reaction occurring within the last 10 years: No If all of the above answers are "NO", then may proceed with Cephalosporin use.  . Sulfa Antibiotics Hives     Current Outpatient Medications:  .  cetirizine (ZYRTEC) 10 MG tablet, Take 10 mg by mouth daily., Disp: , Rfl:  .  enalapril (VASOTEC) 5 MG tablet, TAKE 1 TABLET (5 MG TOTAL) BY MOUTH DAILY., Disp: 30 tablet, Rfl: 3  Review of Systems  Constitutional: Positive for malaise/fatigue. Negative for fever.  Respiratory: Negative for cough and shortness of breath.   Cardiovascular: Positive for chest pain and palpitations. Negative for syncope and near-syncope.  Gastrointestinal: Negative for abdominal pain, nausea and vomiting.  Musculoskeletal: Positive for back pain.  Neurological: Negative for dizziness, weakness, numbness and headaches.    Social History   Tobacco  Use  . Smoking status: Never Smoker  . Smokeless tobacco: Never Used  Substance Use Topics  . Alcohol use: No   Objective:   BP 122/70 (BP Location: Left Arm, Patient Position: Sitting, Cuff Size: Normal)   Pulse 64   Temp 98.1 F (36.7 C) (Oral)   Resp 16   Wt 123 lb (55.8 kg)   LMP 08/20/2017   SpO2 99%   BMI 21.11 kg/m  Vitals:   08/28/17 1608  BP: 122/70  Pulse: 64  Resp: 16    Temp: 98.1 F (36.7 C)  TempSrc: Oral  SpO2: 99%  Weight: 123 lb (55.8 kg)     Physical Exam  Constitutional: She is oriented to person, place, and time. She appears well-developed and well-nourished. No distress.  HENT:  Head: Normocephalic and atraumatic.  Eyes: Conjunctivae are normal. Pupils are equal, round, and reactive to light. No scleral icterus.  Neck: Neck supple. No thyromegaly present.  Cardiovascular: Normal rate, regular rhythm, normal heart sounds and intact distal pulses.  No murmur heard. Pulmonary/Chest: Effort normal and breath sounds normal. No respiratory distress. She has no wheezes. She has no rales. She exhibits tenderness (TTP that mimics chest pain over L costal margin and L thoracic back, not midline).  Abdominal: Soft. Bowel sounds are normal. She exhibits no distension. There is no tenderness. There is no rebound and no guarding.  Musculoskeletal: She exhibits no edema or deformity.  Lymphadenopathy:    She has no cervical adenopathy.  Neurological: She is alert and oriented to person, place, and time.  Skin: Skin is warm and dry. No rash noted.  Psychiatric: Her behavior is normal.  Somewhat anxious, denies depressed mood  Vitals reviewed.   EKG: NSR, no ST/T changes    Assessment & Plan:     1. Chest pain, unspecified type - EKG 12-Lead  2. Costochondritis - chest pain positional and reproducible on exam and began around time of shoveling snow - suspect MSK origin - reassuring EKG, minimal risk factors for cardiac cause and low HEART score - discussed rest, NSAIDs - return precautions discussed  3. Essential hypertension Problem List Items Addressed This Visit      Cardiovascular and Mediastinum   Hypertension    Well controlled currently Continue enalapril Discussed that it could be elevated due to white coat syndrome or anxiety Will treat anxiety with therapy and keep home log of BP and bring to return visit       Other Visit  Diagnoses    Chest pain, unspecified type    -  Primary   Relevant Orders   EKG 12-Lead (Completed)   Costochondritis          Return if symptoms worsen or fail to improve.     The entirety of the information documented in the History of Present Illness, Review of Systems and Physical Exam were personally obtained by me. Portions of this information were initially documented by Celestia KhatJosie Rosas, CMA and reviewed by me for thoroughness and accuracy.     Shirlee LatchAngela Bacigalupo, MD  [facility]

## 2017-08-28 NOTE — Patient Instructions (Signed)
Costochondritis Costochondritis is swelling and irritation (inflammation) of the tissue (cartilage) that connects your ribs to your breastbone (sternum). This causes pain in the front of your chest. The pain usually starts gradually and involves more than one rib. What are the causes? The exact cause of this condition is not always known. It results from stress on the cartilage where your ribs attach to your sternum. The cause of this stress could be:  Chest injury (trauma).  Exercise or activity, such as lifting.  Severe coughing.  What increases the risk? You may be at higher risk for this condition if you:  Are female.  Are 30?26 years old.  Recently started a new exercise or work activity.  Have low levels of vitamin D.  Have a condition that makes you cough frequently.  What are the signs or symptoms? The main symptom of this condition is chest pain. The pain:  Usually starts gradually and can be sharp or dull.  Gets worse with deep breathing, coughing, or exercise.  Gets better with rest.  May be worse when you press on the sternum-rib connection (tenderness).  How is this diagnosed? This condition is diagnosed based on your symptoms, medical history, and a physical exam. Your health care provider will check for tenderness when pressing on your sternum. This is the most important finding. You may also have tests to rule out other causes of chest pain. These may include:  A chest X-ray to check for lung problems.  An electrocardiogram (ECG) to see if you have a heart problem that could be causing the pain.  An imaging scan to rule out a chest or rib fracture.  How is this treated? This condition usually goes away on its own over time. Your health care provider may prescribe an NSAID to reduce pain and inflammation. Your health care provider may also suggest that you:  Rest and avoid activities that make pain worse.  Apply heat or cold to the area to reduce pain  and inflammation.  Do exercises to stretch your chest muscles.  If these treatments do not help, your health care provider may inject a numbing medicine at the sternum-rib connection to help relieve the pain. Follow these instructions at home:  Avoid activities that make pain worse. This includes any activities that use chest, abdominal, and side muscles.  If directed, put ice on the painful area: ? Put ice in a plastic bag. ? Place a towel between your skin and the bag. ? Leave the ice on for 20 minutes, 2-3 times a day.  If directed, apply heat to the affected area as often as told by your health care provider. Use the heat source that your health care provider recommends, such as a moist heat pack or a heating pad. ? Place a towel between your skin and the heat source. ? Leave the heat on for 20-30 minutes. ? Remove the heat if your skin turns bright red. This is especially important if you are unable to feel pain, heat, or cold. You may have a greater risk of getting burned.  Take over-the-counter and prescription medicines only as told by your health care provider.  Return to your normal activities as told by your health care provider. Ask your health care provider what activities are safe for you.  Keep all follow-up visits as told by your health care provider. This is important. Contact a health care provider if:  You have chills or a fever.  Your pain does not go   away or it gets worse.  You have a cough that does not go away (is persistent). Get help right away if:  You have shortness of breath. This information is not intended to replace advice given to you by your health care provider. Make sure you discuss any questions you have with your health care provider. Document Released: 06/13/2005 Document Revised: 03/23/2016 Document Reviewed: 12/28/2015 Elsevier Interactive Patient Education  2018 Elsevier Inc.   

## 2017-08-28 NOTE — Telephone Encounter (Signed)
Patient reports constant chest pain since Monday. She describes the pain as constant, dull, and worse at night time. She denies nausea, vomiting, SOB and jaw pain. She states she has some back pain, but assumes that is from shoveling snow. Her BP last night was 132/90. Patient reports she has episodes of chest pain when she feels anxious or stressed. She does not have a current diagnosis of anxiety, but wants to be evaluated for anxiety. Discussed with Dr. Leonard SchwartzB. Patient scheduled at 3:45 pm today.

## 2017-08-29 DIAGNOSIS — O10919 Unspecified pre-existing hypertension complicating pregnancy, unspecified trimester: Secondary | ICD-10-CM

## 2017-08-29 DIAGNOSIS — I10 Essential (primary) hypertension: Secondary | ICD-10-CM

## 2017-08-29 HISTORY — DX: Unspecified pre-existing hypertension complicating pregnancy, unspecified trimester: O10.919

## 2017-08-29 HISTORY — DX: Essential (primary) hypertension: I10

## 2017-08-29 NOTE — Assessment & Plan Note (Addendum)
Well controlled currently Continue enalapril Discussed that it could be elevated due to white coat syndrome or anxiety Will treat anxiety with therapy and keep home log of BP and bring to return visit

## 2017-09-17 NOTE — L&D Delivery Note (Signed)
Delivery Note Pt presented this am [facility]. She was started on Pit and had SROM @ 2202, becoming complete at 2233. She pushed well and at 11:24 PM a viable female was delivered via Vaginal, Spontaneous (Presentation: direct OP).  APGAR: 8, 9; weight: pending. Infant dried and placed on pt's abd. Cord clamped and cut by FOB after 1min delay; hospital cord blood sample collected. Placenta status: spont ,intact .  Cord: 3 vessel  Anesthesia:  Epidural Episiotomy: None Lacerations: None Est. Blood Loss (mL): 150  Mom to postpartum.  Baby to Couplet care / Skin to Skin.  Cam HaiSHAW, Tacey Dimaggio CNM 05/17/2018, 11:43 PM  Please schedule this patient for Postpartum visit in: 1 week for BP check with the following provider: Any provider; then in 4-6 wks for PP visit For C/S patients schedule nurse incision check in weeks 2 weeks: no High risk pregnancy complicated by: cHTN Delivery mode:  SVD Anticipated Birth Control:  NFP PP Procedures needed: BP check  Schedule Integrated BH visit: no

## 2017-10-16 ENCOUNTER — Telehealth: Payer: Self-pay | Admitting: *Deleted

## 2017-10-16 ENCOUNTER — Ambulatory Visit (INDEPENDENT_AMBULATORY_CARE_PROVIDER_SITE_OTHER): Payer: 59 | Admitting: *Deleted

## 2017-10-16 DIAGNOSIS — Z113 Encounter for screening for infections with a predominantly sexual mode of transmission: Secondary | ICD-10-CM | POA: Diagnosis not present

## 2017-10-16 DIAGNOSIS — Z3480 Encounter for supervision of other normal pregnancy, unspecified trimester: Secondary | ICD-10-CM

## 2017-10-16 DIAGNOSIS — O0990 Supervision of high risk pregnancy, unspecified, unspecified trimester: Secondary | ICD-10-CM | POA: Insufficient documentation

## 2017-10-16 DIAGNOSIS — Z3687 Encounter for antenatal screening for uncertain dates: Secondary | ICD-10-CM | POA: Diagnosis not present

## 2017-10-16 NOTE — Telephone Encounter (Signed)
Spoke to pt about swelling. She will come in today for Nurse Only visit and baseline prenatal labs.

## 2017-10-16 NOTE — Telephone Encounter (Signed)
-----   Message from Lindell SparHeather L Bacon, NT sent at 10/16/2017  8:50 AM EST ----- Contact: [phone] Pt in 8 wks preg and is having swelling in her hands, would like a call back

## 2017-10-16 NOTE — Progress Notes (Signed)
PRENATAL INTAKE SUMMARY  [name] presents today New OB Nurse Interview.  OB History    Gravida Para Term Preterm AB Living   2 1 1  0 0 1   SAB TAB Ectopic Multiple Live Births   0 0 0 0 1     I have reviewed the patient's medical, obstetrical, social, and family histories, medications, and available lab results.  SUBJECTIVE She has no unusual complaints and complains of Swelling in hands.  OBJECTIVE Initial Physical Exam (New OB)  GENERAL APPEARANCE: alert, well appearing, in no apparent distress, oriented to person, place and time   ASSESSMENT Normal pregnancy DATING AND VIABILITY SONOGRAM   Cynthia Crawford is a 27 y.o. year old G2P1001 with LMP Patient's last menstrual period was 08/20/2017. which would correlate to  6091w1d weeks gestation.  She has regular menstrual cycles.   She is here today for a confirmatory initial sonogram.    GESTATION: SINGLETON     FETAL ACTIVITY:          Heart rate: 175bpm          The fetus is active.  GESTATIONAL AGE AND  BIOMETRICS:  Gestational criteria: Estimated Date of Delivery: 05/27/18 by LMP now at 1691w1d  Previous Scans:0  CROWN RUMP LENGTH 1.61cm 8.0wks                                                   AVERAGE EGA(BY THIS SCAN):  8.0 weeks  WORKING EDD( LMP ):  05/27/18     TECHNICIAN COMMENTS:  SLIUP measuring 8.0wks by CRL with FHR 175bpm   A copy of this report including all images has been saved and backed up to a second source for retrieval if needed. All measures and details of the anatomical scan, placentation, fluid volume and pelvic anatomy are contained in that report.  Cynthia Crawford 10/16/2017 10:51 AM    PLAN Prenatal care OB Pnl/HIV  OB Urine Culture GC/CT HgbEval SMA CF A1C Glucose   If CHTN - P/C Ratio and CMP

## 2017-10-17 LAB — COMPREHENSIVE METABOLIC PANEL
A/G RATIO: 1.3 (ref 1.2–2.2)
ALT: 13 IU/L (ref 0–32)
AST: 17 IU/L (ref 0–40)
Albumin: 4.4 g/dL (ref 3.5–5.5)
Alkaline Phosphatase: 43 IU/L (ref 39–117)
BUN/Creatinine Ratio: 15 (ref 9–23)
BUN: 9 mg/dL (ref 6–20)
Bilirubin Total: 0.3 mg/dL (ref 0.0–1.2)
CO2: 20 mmol/L (ref 20–29)
Calcium: 9.5 mg/dL (ref 8.7–10.2)
Chloride: 103 mmol/L (ref 96–106)
Creatinine, Ser: 0.61 mg/dL (ref 0.57–1.00)
GFR calc Af Amer: 145 mL/min/{1.73_m2} (ref >59)
GFR calc non Af Amer: 126 mL/min/{1.73_m2} (ref >59)
Globulin, Total: 3.4 g/dL (ref 1.5–4.5)
Glucose: 76 mg/dL (ref 65–99)
POTASSIUM: 4.1 mmol/L (ref 3.5–5.2)
Sodium: 138 mmol/L (ref 134–144)
Total Protein: 7.8 g/dL (ref 6.0–8.5)

## 2017-10-17 LAB — OBSTETRIC PANEL, INCLUDING HIV
Antibody Screen: NEGATIVE
Basophils Absolute: 0 10*3/uL (ref 0.0–0.2)
Basos: 0 %
EOS (ABSOLUTE): 0.1 10*3/uL (ref 0.0–0.4)
EOS: 1 %
HEMOGLOBIN: 11.5 g/dL (ref 11.1–15.9)
HEP B S AG: NEGATIVE
HIV SCREEN 4TH GENERATION: NONREACTIVE
Hematocrit: 35 % (ref 34.0–46.6)
Immature Grans (Abs): 0 10*3/uL (ref 0.0–0.1)
Immature Granulocytes: 0 %
LYMPHS ABS: 1.9 10*3/uL (ref 0.7–3.1)
Lymphs: 22 %
MCH: 29.5 pg (ref 26.6–33.0)
MCHC: 32.9 g/dL (ref 31.5–35.7)
MCV: 90 fL (ref 79–97)
Monocytes Absolute: 0.5 10*3/uL (ref 0.1–0.9)
Monocytes: 6 %
NEUTROS ABS: 6.2 10*3/uL (ref 1.4–7.0)
Neutrophils: 71 %
Platelets: 213 10*3/uL (ref 150–379)
RBC: 3.9 x10E6/uL (ref 3.77–5.28)
RDW: 13.3 % (ref 12.3–15.4)
RH TYPE: POSITIVE
RPR: NONREACTIVE
Rubella Antibodies, IGG: 2.41 index (ref >0.99)
WBC: 8.6 10*3/uL (ref 3.4–10.8)

## 2017-10-17 LAB — PROTEIN / CREATININE RATIO, URINE
CREATININE, UR: 74.5 mg/dL
Protein, Ur: 6 mg/dL
Protein/Creat Ratio: 81 mg/g creat (ref 0–200)

## 2017-10-17 LAB — GC/CHLAMYDIA PROBE AMP (~~LOC~~) NOT AT ARMC
Chlamydia: NEGATIVE
Neisseria Gonorrhea: NEGATIVE

## 2017-10-17 LAB — HEMOGLOBIN A1C
ESTIMATED AVERAGE GLUCOSE: 114 mg/dL
HEMOGLOBIN A1C: 5.6 % (ref 4.8–5.6)

## 2017-10-17 LAB — TSH: TSH: 1.63 u[IU]/mL (ref 0.450–4.500)

## 2017-10-18 LAB — URINE CULTURE, OB REFLEX

## 2017-10-18 LAB — CULTURE, OB URINE

## 2017-10-23 ENCOUNTER — Encounter: Payer: Self-pay | Admitting: Obstetrics & Gynecology

## 2017-10-23 ENCOUNTER — Ambulatory Visit (INDEPENDENT_AMBULATORY_CARE_PROVIDER_SITE_OTHER): Payer: 59 | Admitting: Obstetrics & Gynecology

## 2017-10-23 VITALS — BP 117/69 | HR 83 | Wt 128.6 lb

## 2017-10-23 DIAGNOSIS — Z8759 Personal history of other complications of pregnancy, childbirth and the puerperium: Secondary | ICD-10-CM | POA: Insufficient documentation

## 2017-10-23 DIAGNOSIS — Z124 Encounter for screening for malignant neoplasm of cervix: Secondary | ICD-10-CM | POA: Diagnosis not present

## 2017-10-23 DIAGNOSIS — O10919 Unspecified pre-existing hypertension complicating pregnancy, unspecified trimester: Secondary | ICD-10-CM

## 2017-10-23 DIAGNOSIS — O10911 Unspecified pre-existing hypertension complicating pregnancy, first trimester: Secondary | ICD-10-CM

## 2017-10-23 DIAGNOSIS — O0991 Supervision of high risk pregnancy, unspecified, first trimester: Secondary | ICD-10-CM

## 2017-10-23 MED ORDER — ASPIRIN EC 81 MG PO TBEC
81.0000 mg | DELAYED_RELEASE_TABLET | Freq: Every day | ORAL | 2 refills | Status: DC
Start: 2017-10-23 — End: 2018-05-14

## 2017-10-23 NOTE — Progress Notes (Signed)
Subjective:   Cynthia Crawford is a 27 y.o. G2P1001 at 6618w1d by LMP, early ultrasound being seen today for her first obstetrical visit.  Her obstetrical history is significant for history of GHTNlast pregnancy and subsequent development of essential hypertension for which she was on Enalapril prior to pregnancy. Was followed by her PCP, Dr. Shirlee LatchAngela Crawford . Patient does intend to breast feed. Pregnancy history fully reviewed.  Patient reports no complaints.  HISTORY: Obstetric History   G2   P1   T1   P0   A0   L1    SAB0   TAB0   Ectopic0   Multiple0   Live Births1     # Outcome Date GA Lbr Len/2nd Weight Sex Delivery Anes PTL Lv  2 Current           1 Term 01/18/16 7346w2d / 03:20 5 lb 13.5 oz (2.651 kg) F Vag-Spont EPI, Local  LIV     Name: Crawford,GIRL Cynthia     Apgar1:  8                Apgar5: 9     Last pap smear was 2016 and was normal  Past Medical History:  Diagnosis Date  . Anxiety   . Asthma   . Chronic hypertension during pregnancy, antepartum 08/29/2017   [x]  Aspirin 81 mg daily after 12 weeks; discontinue after 36 weeks Current antihypertensives:  None  Was on Enalapril prior to pregnancy  Baseline and surveillance labs [facility] refresh links as needed)  Lab Results Component Value Date  PLT 213 10/16/2017  CREATININE 0.61 10/16/2017  AST 17 10/16/2017  ALT 13 10/16/2017  PROTCRRATIO 0.21 (H) 01/17/2016  PROTEIN24HR 242 (H) 01/16/2016  . History of gestational hypertension    Past Surgical History:  Procedure Laterality Date  . TONSILLECTOMY     Family History  Problem Relation Age of Onset  . Diabetes Father   . Asthma Mother    Social History   Tobacco Use  . Smoking status: Never Smoker  . Smokeless tobacco: Never Used  Substance Use Topics  . Alcohol use: No  . Drug use: No   Allergies  Allergen Reactions  . Penicillins Hives    Has patient had a PCN reaction causing immediate rash, facial/tongue/throat swelling, SOB or  lightheadedness with hypotension: Yes Has patient had a PCN reaction causing severe rash involving mucus membranes or skin necrosis: No Has patient had a PCN reaction that required hospitalization No Has patient had a PCN reaction occurring within the last 10 years: No If all of the above answers are "NO", then may proceed with Cephalosporin use.  . Sulfa Antibiotics Hives   Current Outpatient Medications on File Prior to Visit  Medication Sig Dispense Refill  . cetirizine (ZYRTEC) 10 MG tablet Take 10 mg by mouth daily.    . Prenatal Vit-Fe Fumarate-FA (MULTIVITAMIN-PRENATAL) 27-0.8 MG TABS tablet Take 1 tablet by mouth daily at 12 noon.     No current facility-administered medications on file prior to visit.    Review of Systems Pertinent items noted in HPI and remainder of comprehensive ROS otherwise negative.  Exam   Vitals:   10/23/17 1534  BP: 117/69  Pulse: 83  Weight: 128 lb 9.6 oz (58.3 kg)   Fetal Heart Rate (bpm): 180  Uterus:     Pelvic Exam: Perineum: no hemorrhoids, normal perineum   Vulva: normal external genitalia, no lesions   Vagina:  normal  mucosa, normal discharge   Cervix: no lesions and normal, pap smear done.    Adnexa: normal adnexa and no mass, fullness, tenderness   Bony Pelvis: average  System: General: well-developed, well-nourished female in no acute distress   Breast:  normal appearance, no masses or tenderness   Skin: normal coloration and turgor, no rashes   Neurologic: oriented, normal, negative, normal mood   Extremities: normal strength, tone, and muscle mass, ROM of all joints is normal   HEENT PERRLA, extraocular movement intact and sclera clear, anicteric   Mouth/Teeth mucous membranes moist, pharynx normal without lesions and dental hygiene good   Neck supple and no masses   Cardiovascular: regular rate and rhythm   Respiratory:  no respiratory distress, normal breath sounds   Abdomen: soft, non-tender; bowel sounds normal; no masses,   no organomegaly   Results for orders placed or performed in visit on 10/16/17 (from the past 336 hour(s))  GC/Chlamydia probe amp ([place])not at [facility]   Collection Time: 10/16/17 12:00 AM  Result Value Ref Range   Chlamydia Negative    Neisseria gonorrhea Negative   Culture, OB Urine   Collection Time: 10/16/17 11:00 AM  Result Value Ref Range   Urine Culture, OB Final report   Urine Culture, OB Reflex   Collection Time: 10/16/17 11:00 AM  Result Value Ref Range   Organism ID, Bacteria Comment   Protein / creatinine ratio, urine   Collection Time: 10/16/17 11:00 AM  Result Value Ref Range   Creatinine, Urine 74.5 Not Estab. mg/dL   Protein, Ur 6.0 Not Estab. mg/dL   Protein/Creat Ratio 81 0 - 200 mg/g creat  Obstetric Panel, Including HIV   Collection Time: 10/16/17 11:03 AM  Result Value Ref Range   Hepatitis B Surface Ag Negative Negative   RPR Ser Ql Non Reactive Non Reactive   Rubella Antibodies, IGG 2.41 Immune >0.99 index   ABO Grouping O    Rh Factor Positive    Antibody Screen Negative Negative   HIV Screen 4th Generation wRfx Non Reactive Non Reactive   WBC 8.6 3.4 - 10.8 x10E3/uL   RBC 3.90 3.77 - 5.28 x10E6/uL   Hemoglobin 11.5 11.1 - 15.9 g/dL   Hematocrit 16.1 09.6 - 46.6 %   MCV 90 79 - 97 fL   MCH 29.5 26.6 - 33.0 pg   MCHC 32.9 31.5 - 35.7 g/dL   RDW 04.5 40.9 - 81.1 %   Platelets 213 150 - 379 x10E3/uL   Neutrophils 71 Not Estab. %   Lymphs 22 Not Estab. %   Monocytes 6 Not Estab. %   Eos 1 Not Estab. %   Basos 0 Not Estab. %   Neutrophils Absolute 6.2 1.4 - 7.0 x10E3/uL   Lymphocytes Absolute 1.9 0.7 - 3.1 x10E3/uL   Monocytes Absolute 0.5 0.1 - 0.9 x10E3/uL   EOS (ABSOLUTE) 0.1 0.0 - 0.4 x10E3/uL   Basophils Absolute 0.0 0.0 - 0.2 x10E3/uL   Immature Granulocytes 0 Not Estab. %   Immature Grans (Abs) 0.0 0.0 - 0.1 x10E3/uL  Comprehensive metabolic panel   Collection Time: 10/16/17 11:03 AM  Result Value Ref Range   Glucose 76 65 - 99  mg/dL   BUN 9 6 - 20 mg/dL   Creatinine, Ser 9.14 0.57 - 1.00 mg/dL   GFR calc non Af Amer 126 >59 mL/min/1.73   GFR calc Af Amer 145 >59 mL/min/1.73   BUN/Creatinine Ratio 15 9 - 23   Sodium 138 134 -  144 mmol/L   Potassium 4.1 3.5 - 5.2 mmol/L   Chloride 103 96 - 106 mmol/L   CO2 20 20 - 29 mmol/L   Calcium 9.5 8.7 - 10.2 mg/dL   Total Protein 7.8 6.0 - 8.5 g/dL   Albumin 4.4 3.5 - 5.5 g/dL   Globulin, Total 3.4 1.5 - 4.5 g/dL   Albumin/Globulin Ratio 1.3 1.2 - 2.2   Bilirubin Total 0.3 0.0 - 1.2 mg/dL   Alkaline Phosphatase 43 39 - 117 IU/L   AST 17 0 - 40 IU/L   ALT 13 0 - 32 IU/L  Hemoglobin A1c   Collection Time: 10/16/17 11:03 AM  Result Value Ref Range   Hgb A1c MFr Bld 5.6 4.8 - 5.6 %   Est. average glucose Bld gHb Est-mCnc 114 mg/dL  TSH   Collection Time: 10/16/17 11:03 AM  Result Value Ref Range   TSH 1.630 0.450 - 4.500 uIU/mL     Assessment:   Pregnancy: G2P1001 Patient Active Problem List   Diagnosis Date Noted  . History of gestational hypertension   . Supervision of high-risk pregnancy 10/16/2017  . Chronic hypertension during pregnancy, antepartum 08/29/2017  . Migraine 09/25/2011     Plan:  1. Chronic hypertension during pregnancy, antepartum 2. History of gestational hypertension [X]  Baseline labs (CBC, CMP, urine Pr:Cr) - normal [X]  Aspirin 81 mg daily prescribed after 12 weeks - aspirin EC 81 MG tablet; Take 1 tablet (81 mg total) by mouth daily. Take after 12 weeks for prevention of preeclampsia later in pregnancy  Dispense: 300 tablet; Refill: 2 [ ]  Serial growth scans 20-24-28-32-35-38 [ ]  Antenatal testing starting at 32 weeks [ ]  Delivery by 39 weeks (with meds, 40 with no meds) or earlier if needed Discussed implications of CHTN in pregnancy, need for antenatal testing and frequent ultrasounds/prenatal visits, need for optimizing BP control to decrease CHTN/preeclampsia associated maternal-fetal morbidity and mortality.  3. Supervision of  high risk pregnancy in first trimester - Cytology - PAP done today Initial labs reviewed with patient. Continue prenatal vitamins. Genetic Screening discussed, NIPS: to be done next visit. Ultrasound discussed; fetal anatomic survey: to be ordered later. Problem list reviewed and updated. The nature of [facility] with multiple MDs and other Advanced Practice Providers was explained to patient; also emphasized that residents, students are part of our team. Routine obstetric precautions reviewed. Return in about 4 weeks (around 11/20/2017) for OB Visit and Genetic Screening.     Jaynie Collins, MD, FACOG Obstetrician & Gynecologist, [facility] for [employer], [facility]

## 2017-10-23 NOTE — Progress Notes (Signed)
FLU SHOT ALREADY GIVEN

## 2017-10-23 NOTE — Patient Instructions (Signed)
First Trimester of Pregnancy The first trimester of pregnancy is from week 1 until the end of week 13 (months 1 through 3). A week after a sperm fertilizes an egg, the egg will implant on the wall of the uterus. This embryo will begin to develop into a baby. Genes from you and your partner will form the baby. The female genes will determine whether the baby will be a boy or a girl. At 6-8 weeks, the eyes and face will be formed, and the heartbeat can be seen on ultrasound. At the end of 12 weeks, all the baby's organs will be formed. Now that you are pregnant, you will want to do everything you can to have a healthy baby. Two of the most important things are to get good prenatal care and to follow your health care provider's instructions. Prenatal care is all the medical care you receive before the baby's birth. This care will help prevent, find, and treat any problems during the pregnancy and childbirth. Body changes during your first trimester Your body goes through many changes during pregnancy. The changes vary from woman to woman.  You may gain or lose a couple of pounds at first.  You may feel sick to your stomach (nauseous) and you may throw up (vomit). If the vomiting is uncontrollable, call your health care provider.  You may tire easily.  You may develop headaches that can be relieved by medicines. All medicines should be approved by your health care provider.  You may urinate more often. Painful urination may mean you have a bladder infection.  You may develop heartburn as a result of your pregnancy.  You may develop constipation because certain hormones are causing the muscles that push stool through your intestines to slow down.  You may develop hemorrhoids or swollen veins (varicose veins).  Your breasts may begin to grow larger and become tender. Your nipples may stick out more, and the tissue that surrounds them (areola) may become darker.  Your gums may bleed and may be  sensitive to brushing and flossing.  Dark spots or blotches (chloasma, mask of pregnancy) may develop on your face. This will likely fade after the baby is born.  Your menstrual periods will stop.  You may have a loss of appetite.  You may develop cravings for certain kinds of food.  You may have changes in your emotions from day to day, such as being excited to be pregnant or being concerned that something may go wrong with the pregnancy and baby.  You may have more vivid and strange dreams.  You may have changes in your hair. These can include thickening of your hair, rapid growth, and changes in texture. Some women also have hair loss during or after pregnancy, or hair that feels dry or thin. Your hair will most likely return to normal after your baby is born.  What to expect at prenatal visits During a routine prenatal visit:  You will be weighed to make sure you and the baby are growing normally.  Your blood pressure will be taken.  Your abdomen will be measured to track your baby's growth.  The fetal heartbeat will be listened to between weeks 10 and 14 of your pregnancy.  Test results from any previous visits will be discussed.  Your health care provider may ask you:  How you are feeling.  If you are feeling the baby move.  If you have had any abnormal symptoms, such as leaking fluid, bleeding, severe headaches,   or abdominal cramping.  If you are using any tobacco products, including cigarettes, chewing tobacco, and electronic cigarettes.  If you have any questions.  Other tests that may be performed during your first trimester include:  Blood tests to find your blood type and to check for the presence of any previous infections. The tests will also be used to check for low iron levels (anemia) and protein on red blood cells (Rh antibodies). Depending on your risk factors, or if you previously had diabetes during pregnancy, you may have tests to check for high blood  sugar that affects pregnant women (gestational diabetes).  Urine tests to check for infections, diabetes, or protein in the urine.  An ultrasound to confirm the proper growth and development of the baby.  Fetal screens for spinal cord problems (spina bifida) and Down syndrome.  HIV (human immunodeficiency virus) testing. Routine prenatal testing includes screening for HIV, unless you choose not to have this test.  You may need other tests to make sure you and the baby are doing well.  Follow these instructions at home: Medicines  Follow your health care provider's instructions regarding medicine use. Specific medicines may be either safe or unsafe to take during pregnancy.  Take a prenatal vitamin that contains at least 600 micrograms (mcg) of folic acid.  If you develop constipation, try taking a stool softener if your health care provider approves. Eating and drinking  Eat a balanced diet that includes fresh fruits and vegetables, whole grains, good sources of protein such as meat, eggs, or tofu, and low-fat dairy. Your health care provider will help you determine the amount of weight gain that is right for you.  Avoid raw meat and uncooked cheese. These carry germs that can cause birth defects in the baby.  Eating four or five small meals rather than three large meals a day may help relieve nausea and vomiting. If you start to feel nauseous, eating a few soda crackers can be helpful. Drinking liquids between meals, instead of during meals, also seems to help ease nausea and vomiting.  Limit foods that are high in fat and processed sugars, such as fried and sweet foods.  To prevent constipation: ? Eat foods that are high in fiber, such as fresh fruits and vegetables, whole grains, and beans. ? Drink enough fluid to keep your urine clear or pale yellow. Activity  Exercise only as directed by your health care provider. Most women can continue their usual exercise routine during  pregnancy. Try to exercise for 30 minutes at least 5 days a week. Exercising will help you: ? Control your weight. ? Stay in shape. ? Be prepared for labor and delivery.  Experiencing pain or cramping in the lower abdomen or lower back is a good sign that you should stop exercising. Check with your health care provider before continuing with normal exercises.  Try to avoid standing for long periods of time. Move your legs often if you must stand in one place for a long time.  Avoid heavy lifting.  Wear low-heeled shoes and practice good posture.  You may continue to have sex unless your health care provider tells you not to. Relieving pain and discomfort  Wear a good support bra to relieve breast tenderness.  Take warm sitz baths to soothe any pain or discomfort caused by hemorrhoids. Use hemorrhoid cream if your health care provider approves.  Rest with your legs elevated if you have leg cramps or low back pain.  If you develop   varicose veins in your legs, wear support hose. Elevate your feet for 15 minutes, 3-4 times a day. Limit salt in your diet. Prenatal care  Schedule your prenatal visits by the twelfth week of pregnancy. They are usually scheduled monthly at first, then more often in the last 2 months before delivery.  Write down your questions. Take them to your prenatal visits.  Keep all your prenatal visits as told by your health care provider. This is important. Safety  Wear your seat belt at all times when driving.  Make a list of emergency phone numbers, including numbers for family, friends, the hospital, and police and fire departments. General instructions  Ask your health care provider for a referral to a local prenatal education class. Begin classes no later than the beginning of month 6 of your pregnancy.  Ask for help if you have counseling or nutritional needs during pregnancy. Your health care provider can offer advice or refer you to specialists for help  with various needs.  Do not use hot tubs, steam rooms, or saunas.  Do not douche or use tampons or scented sanitary pads.  Do not cross your legs for long periods of time.  Avoid cat litter boxes and soil used by cats. These carry germs that can cause birth defects in the baby and possibly loss of the fetus by miscarriage or stillbirth.  Avoid all smoking, herbs, alcohol, and medicines not prescribed by your health care provider. Chemicals in these products affect the formation and growth of the baby.  Do not use any products that contain nicotine or tobacco, such as cigarettes and e-cigarettes. If you need help quitting, ask your health care provider. You may receive counseling support and other resources to help you quit.  Schedule a dentist appointment. At home, brush your teeth with a soft toothbrush and be gentle when you floss. Contact a health care provider if:  You have dizziness.  You have mild pelvic cramps, pelvic pressure, or nagging pain in the abdominal area.  You have persistent nausea, vomiting, or diarrhea.  You have a bad smelling vaginal discharge.  You have pain when you urinate.  You notice increased swelling in your face, hands, legs, or ankles.  You are exposed to fifth disease or chickenpox.  You are exposed to German measles (rubella) and have never had it. Get help right away if:  You have a fever.  You are leaking fluid from your vagina.  You have spotting or bleeding from your vagina.  You have severe abdominal cramping or pain.  You have rapid weight gain or loss.  You vomit blood or material that looks like coffee grounds.  You develop a severe headache.  You have shortness of breath.  You have any kind of trauma, such as from a fall or a car accident. Summary  The first trimester of pregnancy is from week 1 until the end of week 13 (months 1 through 3).  Your body goes through many changes during pregnancy. The changes vary from  woman to woman.  You will have routine prenatal visits. During those visits, your health care provider will examine you, discuss any test results you may have, and talk with you about how you are feeling. This information is not intended to replace advice given to you by your health care provider. Make sure you discuss any questions you have with your health care provider. Document Released: 08/28/2001 Document Revised: 08/15/2016 Document Reviewed: 08/15/2016 Elsevier Interactive Patient Education  2018 Elsevier   Inc.  

## 2017-10-25 LAB — SMN1 COPY NUMBER ANALYSIS (SMA CARRIER SCREENING)

## 2017-10-25 LAB — HEMOGLOBINOPATHY EVALUATION
HEMOGLOBIN A2 QUANTITATION: 2.6 % (ref 1.8–3.2)
HEMOGLOBIN F QUANTITATION: 0 % (ref 0.0–2.0)
HGB C: 0 %
HGB S: 0 %
HGB VARIANT: 0 %
Hgb A: 97.4 % (ref 96.4–98.8)

## 2017-10-25 LAB — CYTOLOGY - PAP: DIAGNOSIS: NEGATIVE

## 2017-10-25 LAB — CYSTIC FIBROSIS MUTATION 97: Interpretation: NOT DETECTED

## 2017-10-28 DIAGNOSIS — O0991 Supervision of high risk pregnancy, unspecified, first trimester: Secondary | ICD-10-CM

## 2017-11-20 ENCOUNTER — Ambulatory Visit (INDEPENDENT_AMBULATORY_CARE_PROVIDER_SITE_OTHER): Payer: 59 | Admitting: Family Medicine

## 2017-11-20 ENCOUNTER — Encounter: Payer: Self-pay | Admitting: Family Medicine

## 2017-11-20 VITALS — BP 139/84 | HR 78 | Wt 130.0 lb

## 2017-11-20 DIAGNOSIS — O0992 Supervision of high risk pregnancy, unspecified, second trimester: Secondary | ICD-10-CM

## 2017-11-20 DIAGNOSIS — L258 Unspecified contact dermatitis due to other agents: Secondary | ICD-10-CM

## 2017-11-20 DIAGNOSIS — O10919 Unspecified pre-existing hypertension complicating pregnancy, unspecified trimester: Secondary | ICD-10-CM

## 2017-11-20 DIAGNOSIS — O10912 Unspecified pre-existing hypertension complicating pregnancy, second trimester: Secondary | ICD-10-CM

## 2017-11-20 HISTORY — DX: Unspecified contact dermatitis due to other agents: L25.8

## 2017-11-20 NOTE — Progress Notes (Signed)
   PRENATAL VISIT NOTE  Subjective:  Cynthia Crawford is a 27 y.o. G2P1001 at 3396w1d being seen today for ongoing prenatal care.  She is currently monitored for the following issues for this low-risk pregnancy and has Migraine; Chronic hypertension during pregnancy, antepartum; Supervision of high-risk pregnancy; History of gestational hypertension; and Contact dermatitis due to soap on their problem list.  Patient reports no complaints.  Contractions: Not present. Vag. Bleeding: None.  Movement: Absent. Denies leaking of fluid.   The following portions of the patient's history were reviewed and updated as appropriate: allergies, current medications, past family history, past medical history, past social history, past surgical history and problem list. Problem list updated.  Objective:   Vitals:   11/20/17 1532  BP: 139/84  Pulse: 78  Weight: 130 lb (59 kg)    Fetal Status: Fetal Heart Rate (bpm): 170   Movement: Absent     General:  Alert, oriented and cooperative. Patient is in no acute distress.  Skin: Skin is warm and dry. No rash noted.   Cardiovascular: Normal heart rate noted  Respiratory: Normal respiratory effort, no problems with respiration noted  Abdomen: Soft, gravid, appropriate for gestational age.  Pain/Pressure: Absent     Pelvic: Cervical exam deferred        Extremities: Normal range of motion.  Edema: None  Mental Status:  Normal mood and affect. Normal behavior. Normal judgment and thought content.   SKIN: Erythematous rash on right AC and right abdomen. + excoriated areas. Reports use of new soap  Assessment and Plan:  Pregnancy: G2P1001 at 5396w1d  1. Supervision of high risk pregnancy in second trimester - UTD, genetic screening today - US MFM OB COMP + 14 WK; Future  2. Chronic hypertension during pregnancy, antepartum - BP WNL  - US MFM OB COMP + 14 WK; Future  3. Contact dermatitis - Recommended stopping use of soap - Recommended OTC  hydrocortisone BID x 2 weeks  Preterm labor symptoms and general obstetric precautions including but not limited to vaginal bleeding, contractions, leaking of fluid and fetal movement were reviewed in detail with the patient. Please refer to After Visit Summary for other counseling recommendations.  Return in about 4 weeks (around 12/18/2017) for Routine prenatal care.   Federico FlakeKimberly Niles Payslee Bateson, MD

## 2017-11-21 ENCOUNTER — Encounter: Payer: 59 | Admitting: Obstetrics and Gynecology

## 2017-11-27 ENCOUNTER — Ambulatory Visit: Payer: Self-pay | Admitting: Family Medicine

## 2017-12-05 ENCOUNTER — Other Ambulatory Visit (INDEPENDENT_AMBULATORY_CARE_PROVIDER_SITE_OTHER): Payer: 59 | Admitting: *Deleted

## 2017-12-05 DIAGNOSIS — R319 Hematuria, unspecified: Secondary | ICD-10-CM | POA: Diagnosis not present

## 2017-12-05 MED ORDER — NITROFURANTOIN MONOHYD MACRO 100 MG PO CAPS: 100.0000 mg | ORAL_CAPSULE | Freq: Two times a day (BID) | ORAL | 0 refills | Status: DC

## 2017-12-05 NOTE — Progress Notes (Signed)
SUBJECTIVE: Cynthia Crawford is a 27 y.o. female who complains of blood in urine for 2 days, with flank pain, without fever, chills, or abnormal vaginal discharge or bleeding.   OBJECTIVE: Appears well, in no apparent distress.  Vital signs are normal. Urine dipstick shows positive for WBC's and positive for RBC's.    ASSESSMENT: Hematuria  PLAN: Treatment per orders.  Call or return to clinic prn if these symptoms worsen or fail to improve as anticipated.

## 2017-12-07 LAB — URINE CULTURE, OB REFLEX

## 2017-12-07 LAB — CULTURE, OB URINE

## 2017-12-18 NOTE — Progress Notes (Signed)
I have reviewed the chart and agree with nursing staff's documentation of this patient's encounter.  Brandn Mcgath, MD 12/18/2017 11:27 AM    

## 2017-12-19 ENCOUNTER — Ambulatory Visit (INDEPENDENT_AMBULATORY_CARE_PROVIDER_SITE_OTHER): Payer: 59 | Admitting: Obstetrics and Gynecology

## 2017-12-19 VITALS — BP 125/80 | HR 72 | Wt 137.0 lb

## 2017-12-19 DIAGNOSIS — O0992 Supervision of high risk pregnancy, unspecified, second trimester: Secondary | ICD-10-CM

## 2017-12-19 DIAGNOSIS — Z8759 Personal history of other complications of pregnancy, childbirth and the puerperium: Secondary | ICD-10-CM

## 2017-12-19 NOTE — Progress Notes (Signed)
Prenatal Visit Note Date: 12/19/2017 Clinic: [facility]  Subjective:  Cynthia Crawford is a 27 y.o. G2P1001 at 4980w2d being seen today for ongoing prenatal care.  She is currently monitored for the following issues for this high-risk pregnancy and has Migraine; Chronic hypertension during pregnancy, antepartum; Supervision of high-risk pregnancy; History of gestational hypertension; and Contact dermatitis due to soap on their problem list.  Patient reports no complaints.   Contractions: Not present. Vag. Bleeding: None.  Movement: Present. Denies leaking of fluid.   The following portions of the patient's history were reviewed and updated as appropriate: allergies, current medications, past family history, past medical history, past social history, past surgical history and problem list. Problem list updated.  Objective:   Vitals:   12/19/17 0857  BP: 125/80  Pulse: 72  Weight: 137 lb (62.1 kg)    Fetal Status: Fetal Heart Rate (bpm): 154   Movement: Present     General:  Alert, oriented and cooperative. Patient is in no acute distress.  Skin: Skin is warm and dry. No rash noted.   Cardiovascular: Normal heart rate noted  Respiratory: Normal respiratory effort, no problems with respiration noted  Abdomen: Soft, gravid, appropriate for gestational age. Pain/Pressure: Absent     Pelvic:  Cervical exam deferred        Extremities: Normal range of motion.  Edema: None  Mental Status: Normal mood and affect. Normal behavior. Normal judgment and thought content.   Urinalysis:      Assessment and Plan:  Pregnancy: G2P1001 at 780w2d  Routine care. Anatomy u/s already scheduled. Declines afp. Continue low dose asa .  Preterm labor symptoms and general obstetric precautions including but not limited to vaginal bleeding, contractions, leaking of fluid and fetal movement were reviewed in detail with the patient. Please refer to After Visit Summary for other  counseling recommendations.  Return in about 1 month (around 01/16/2018) for rob.   [place] Bing[place], Cynthia Lippold, MD

## 2017-12-23 ENCOUNTER — Other Ambulatory Visit (INDEPENDENT_AMBULATORY_CARE_PROVIDER_SITE_OTHER): Payer: 59

## 2017-12-23 VITALS — BP 133/82 | HR 70

## 2017-12-23 DIAGNOSIS — Z113 Encounter for screening for infections with a predominantly sexual mode of transmission: Secondary | ICD-10-CM | POA: Diagnosis not present

## 2017-12-23 DIAGNOSIS — R319 Hematuria, unspecified: Secondary | ICD-10-CM

## 2017-12-23 NOTE — Progress Notes (Signed)
I have reviewed this chart and agree with the RN/CMA assessment and management.    K. Meryl Davis, M.D. Attending Obstetrician & Gynecologist, [facility], [facility]  

## 2017-12-23 NOTE — Progress Notes (Signed)
SUBJECTIVE: Cynthia Crawford is a 27 y.o. female who complains of blood in her urine that started this am. She reports no flank pain, fever, chills, or abnormal vaginal discharge or bleeding.   OBJECTIVE: Appears well, in no apparent distress.  Vital signs are normal. Urine dipstick unable to read due to the amount of blood.  ASSESSMENT: Hematuria culture obtain/ labeled and sent to lab.  PLAN: Treatment per orders.  Called on call provider Earlene Plater(Davis) who suggested to obtain urine culture and self swab to check for STI. Patient was advised we will follow up with her once results are back. Advised to go to MAU if she develops fever greater than 100, chills or flank pain. Patient is agreeable with pain.

## 2017-12-24 ENCOUNTER — Other Ambulatory Visit: Payer: 59

## 2017-12-24 ENCOUNTER — Other Ambulatory Visit: Payer: Self-pay

## 2017-12-24 DIAGNOSIS — O2340 Unspecified infection of urinary tract in pregnancy, unspecified trimester: Secondary | ICD-10-CM

## 2017-12-24 LAB — CERVICOVAGINAL ANCILLARY ONLY
Bacterial vaginitis: NEGATIVE
CHLAMYDIA, DNA PROBE: NEGATIVE
Candida vaginitis: NEGATIVE
NEISSERIA GONORRHEA: NEGATIVE
TRICH (WINDOWPATH): NEGATIVE

## 2017-12-24 LAB — POCT URINE QUALITATIVE DIPSTICK BLOOD: Blood, UA: POSITIVE

## 2017-12-24 LAB — URINE CULTURE: ORGANISM ID, BACTERIA: NO GROWTH

## 2017-12-24 MED ORDER — NITROFURANTOIN MONOHYD MACRO 100 MG PO CAPS: 100.0000 mg | ORAL_CAPSULE | Freq: Two times a day (BID) | ORAL | 0 refills | Status: DC

## 2017-12-24 NOTE — Telephone Encounter (Signed)
Patient complains of UTI symptoms and urine culture has not come back yet. Urine check today was positive for blood and patient reports low back pain and would like to be treated.

## 2017-12-29 ENCOUNTER — Inpatient Hospital Stay (HOSPITAL_COMMUNITY): Payer: 59

## 2017-12-29 ENCOUNTER — Encounter (HOSPITAL_COMMUNITY): Payer: Self-pay

## 2017-12-29 ENCOUNTER — Inpatient Hospital Stay (HOSPITAL_COMMUNITY)
Admission: AD | Admit: 2017-12-29 | Discharge: 2017-12-30 | Disposition: A | Discharge status: Home / Self Care | Payer: 59 | Source: Ambulatory Visit | Attending: Obstetrics & Gynecology | Admitting: Obstetrics & Gynecology

## 2017-12-29 ENCOUNTER — Other Ambulatory Visit: Payer: Self-pay

## 2017-12-29 DIAGNOSIS — R109 Unspecified abdominal pain: Secondary | ICD-10-CM | POA: Diagnosis not present

## 2017-12-29 DIAGNOSIS — N200 Calculus of kidney: Secondary | ICD-10-CM | POA: Insufficient documentation

## 2017-12-29 DIAGNOSIS — Z833 Family history of diabetes mellitus: Secondary | ICD-10-CM | POA: Insufficient documentation

## 2017-12-29 DIAGNOSIS — Z825 Family history of asthma and other chronic lower respiratory diseases: Secondary | ICD-10-CM | POA: Insufficient documentation

## 2017-12-29 DIAGNOSIS — O26892 Other specified pregnancy related conditions, second trimester: Secondary | ICD-10-CM | POA: Diagnosis not present

## 2017-12-29 DIAGNOSIS — Z882 Allergy status to sulfonamides status: Secondary | ICD-10-CM | POA: Diagnosis not present

## 2017-12-29 DIAGNOSIS — Z7982 Long term (current) use of aspirin: Secondary | ICD-10-CM | POA: Diagnosis not present

## 2017-12-29 DIAGNOSIS — O10012 Pre-existing essential hypertension complicating pregnancy, second trimester: Secondary | ICD-10-CM | POA: Insufficient documentation

## 2017-12-29 DIAGNOSIS — N1330 Unspecified hydronephrosis: Secondary | ICD-10-CM | POA: Diagnosis not present

## 2017-12-29 DIAGNOSIS — Z3A18 18 weeks gestation of pregnancy: Secondary | ICD-10-CM | POA: Diagnosis not present

## 2017-12-29 DIAGNOSIS — R110 Nausea: Secondary | ICD-10-CM | POA: Diagnosis present

## 2017-12-29 DIAGNOSIS — Z880 Allergy status to penicillin: Secondary | ICD-10-CM | POA: Diagnosis not present

## 2017-12-29 DIAGNOSIS — O26832 Pregnancy related renal disease, second trimester: Secondary | ICD-10-CM

## 2017-12-29 LAB — URINALYSIS, ROUTINE W REFLEX MICROSCOPIC
Bilirubin Urine: NEGATIVE
GLUCOSE, UA: NEGATIVE mg/dL
KETONES UR: NEGATIVE mg/dL
NITRITE: NEGATIVE
PH: 7 (ref 5.0–8.0)
Protein, ur: NEGATIVE mg/dL
Specific Gravity, Urine: 1.018 (ref 1.005–1.030)

## 2017-12-29 LAB — CBC
HCT: 32 % — ABNORMAL LOW (ref 36.0–46.0)
HEMOGLOBIN: 11.2 g/dL — AB (ref 12.0–15.0)
MCH: 30.9 pg (ref 26.0–34.0)
MCHC: 35 g/dL (ref 30.0–36.0)
MCV: 88.4 fL (ref 78.0–100.0)
Platelets: 163 10*3/uL (ref 150–400)
RBC: 3.62 MIL/uL — ABNORMAL LOW (ref 3.87–5.11)
RDW: 13.3 % (ref 11.5–15.5)
WBC: 15.6 10*3/uL — ABNORMAL HIGH (ref 4.0–10.5)

## 2017-12-29 MED ORDER — OXYCODONE-ACETAMINOPHEN 5-325 MG PO TABS: 1.0000 | ORAL_TABLET | Freq: Once | ORAL | Status: DC

## 2017-12-29 MED ORDER — LACTATED RINGERS IV BOLUS
1000.0000 mL | Freq: Once | INTRAVENOUS | Status: AC
  Administered 2017-12-30: 1000 mL via INTRAVENOUS

## 2017-12-29 MED ORDER — CYCLOBENZAPRINE HCL 5 MG PO TABS
5.0000 mg | ORAL_TABLET | Freq: Once | ORAL | Status: AC
  Administered 2017-12-29: 5 mg via ORAL
  Filled 2017-12-29: qty 1

## 2017-12-29 MED ORDER — LACTATED RINGERS IV BOLUS
1000.0000 mL | Freq: Once | INTRAVENOUS | Status: AC
  Administered 2017-12-29: 1000 mL via INTRAVENOUS

## 2017-12-29 MED ORDER — OXYCODONE-ACETAMINOPHEN 5-325 MG PO TABS
1.0000 | ORAL_TABLET | Freq: Once | ORAL | Status: AC
  Administered 2017-12-29: 1 via ORAL
  Filled 2017-12-29: qty 1

## 2017-12-29 MED ORDER — ONDANSETRON 8 MG PO TBDP
8.0000 mg | ORAL_TABLET | Freq: Once | ORAL | Status: AC
  Administered 2017-12-29: 8 mg via ORAL
  Filled 2017-12-29: qty 1

## 2017-12-29 NOTE — MAU Provider Note (Signed)
History     CSN: [id number]  Arrival date and time: 12/29/17 4098   First Provider Initiated Contact with Patient 12/29/17 2038     Chief Complaint  Patient presents with  . Nausea  . Back Pain   HPI Cynthia Crawford is a 27 y.o. G2P1001 at [redacted]w[redacted]d who presents with right flank pain. She states she went to the office on Monday and was given Macrobid for a UTI. She states today the pain started to get worse. She rates the pain a 8/10 and has not tried anything for the pain. She also reports nausea and vomiting since 1900. She denies any urinary symptoms or fevers at home. She denies any vaginal bleeding or leaking of fluid.   OB History    Gravida  2   Para  1   Term  1   Preterm  0   AB  0   Living  1     SAB  0   TAB  0   Ectopic  0   Multiple  0   Live Births  1           Past Medical History:  Diagnosis Date  . Anxiety   . Asthma   . Chronic hypertension during pregnancy, antepartum 08/29/2017   [x]  Aspirin 81 mg daily after 12 weeks; discontinue after 36 weeks Current antihypertensives:  None  Was on Enalapril prior to pregnancy  Baseline and surveillance labs (pulled in from [facility], refresh links as needed)  Lab Results Component Value Date  PLT 213 10/16/2017  CREATININE 0.61 10/16/2017  AST 17 10/16/2017  ALT 13 10/16/2017  PROTCRRATIO 0.21 (H) 01/17/2016  PROTEIN24HR 242 (H) 01/16/2016  . History of gestational hypertension     Past Surgical History:  Procedure Laterality Date  . TONSILLECTOMY      Family History  Problem Relation Age of Onset  . Diabetes Father   . Asthma Mother     Social History   Tobacco Use  . Smoking status: Never Smoker  . Smokeless tobacco: Never Used  Substance Use Topics  . Alcohol use: No  . Drug use: No    Allergies:  Allergies  Allergen Reactions  . Penicillins Hives    Has patient had a PCN reaction causing immediate rash, facial/tongue/throat swelling, SOB or lightheadedness with hypotension:  Yes Has patient had a PCN reaction causing severe rash involving mucus membranes or skin necrosis: No Has patient had a PCN reaction that required hospitalization No Has patient had a PCN reaction occurring within the last 10 years: No If all of the above answers are "NO", then may proceed with Cephalosporin use.  . Sulfa Antibiotics Hives    Medications Prior to Admission  Medication Sig Dispense Refill Last Dose  . aspirin EC 81 MG tablet Take 1 tablet (81 mg total) by mouth daily. Take after 12 weeks for prevention of preeclampsia later in pregnancy 300 tablet 2 Taking  . cetirizine (ZYRTEC) 10 MG tablet Take 10 mg by mouth daily.   Taking  . nitrofurantoin, macrocrystal-monohydrate, (MACROBID) 100 MG capsule Take 1 capsule (100 mg total) by mouth 2 (two) times daily. 2nd dose qhs 14 capsule 0   . Prenatal Vit-Fe Fumarate-FA (MULTIVITAMIN-PRENATAL) 27-0.8 MG TABS tablet Take 1 tablet by mouth daily at 12 noon.   Taking    Review of Systems  Constitutional: Negative.  Negative for fatigue and fever.  HENT: Negative.   Respiratory: Negative.  Negative for shortness of breath.  Cardiovascular: Negative.  Negative for chest pain.  Gastrointestinal: Negative.  Negative for abdominal pain, constipation, diarrhea, nausea and vomiting.  Genitourinary: Positive for flank pain and hematuria. Negative for dysuria, vaginal bleeding and vaginal discharge.  Neurological: Negative.  Negative for dizziness and headaches.   Physical Exam   Blood pressure (!) 141/73, pulse 75, temperature (!) 97.5 F (36.4 C), temperature source Oral, resp. rate 18, height 5\' 4"  (1.626 m), weight 140 lb (63.5 kg), last menstrual period 08/20/2017, SpO2 100 %, not currently breastfeeding.  Physical Exam  Nursing note and vitals reviewed. Constitutional: She is oriented to person, place, and time. She appears well-developed and well-nourished. No distress.  HENT:  Head: Normocephalic.  Eyes: Pupils are equal,  round, and reactive to light.  Cardiovascular: Normal rate, regular rhythm and normal heart sounds.  Respiratory: Effort normal and breath sounds normal. No respiratory distress.  GI: Soft. Bowel sounds are normal. She exhibits no distension. There is no tenderness. There is no CVA tenderness.  Neurological: She is alert and oriented to person, place, and time.  Skin: Skin is warm and dry.  Psychiatric: She has a normal mood and affect. Her behavior is normal. Judgment and thought content normal.   FHT: 167 bpm  Dilation: Closed Effacement (%): Thick Exam by:: C Idara Woodside CNM   MAU Course  Procedures Results for orders placed or performed during the hospital encounter of 12/29/17 (from the past 24 hour(s))  Urinalysis, Routine w reflex microscopic     Status: Abnormal   Collection Time: 12/29/17  8:10 PM  Result Value Ref Range   Color, Urine YELLOW YELLOW   APPearance CLOUDY (A) CLEAR   Specific Gravity, Urine 1.018 1.005 - 1.030   pH 7.0 5.0 - 8.0   Glucose, UA NEGATIVE NEGATIVE mg/dL   Hgb urine dipstick MODERATE (A) NEGATIVE   Bilirubin Urine NEGATIVE NEGATIVE   Ketones, ur NEGATIVE NEGATIVE mg/dL   Protein, ur NEGATIVE NEGATIVE mg/dL   Nitrite NEGATIVE NEGATIVE   Leukocytes, UA MODERATE (A) NEGATIVE   RBC / HPF TOO NUMEROUS TO COUNT 0 - 5 RBC/hpf   WBC, UA 6-30 0 - 5 WBC/hpf   Bacteria, UA MANY (A) NONE SEEN   Squamous Epithelial / LPF 0-5 (A) NONE SEEN   Mucus PRESENT   CBC     Status: Abnormal   Collection Time: 12/29/17  9:01 PM  Result Value Ref Range   WBC 15.6 (H) 4.0 - 10.5 K/uL   RBC 3.62 (L) 3.87 - 5.11 MIL/uL   Hemoglobin 11.2 (L) 12.0 - 15.0 g/dL   HCT 40.932.0 (L) 81.136.0 - 91.446.0 %   MCV 88.4 78.0 - 100.0 fL   MCH 30.9 26.0 - 34.0 pg   MCHC 35.0 30.0 - 36.0 g/dL   RDW 78.213.3 95.611.5 - 21.315.5 %   Platelets 163 150 - 400 K/uL   [place] Renal  Result Date: 12/29/2017 CLINICAL DATA:  Right flank pain, leukocytosis, [redacted] weeks pregnant EXAM: RENAL/URINARY TRACT ULTRASOUND  TECHNIQUE: Ultrasound examination of the abdominal aorta was performed to evaluate for abdominal aortic aneurysm. The common iliac arteries, IVC, and kidneys were also evaluated. COMPARISON:  None. FINDINGS: Abdominal Aorta No aneurysm identified. Maximum AP Diameter:  1.3 cm Maximum TRV Diameter: 1.6 cm Right Common Iliac Artery No aneurysm identified. Left Common Iliac Artery No aneurysm identified. IVC No abnormality visualized. Right Kidney Length: 12.3 cm. Echogenicity within normal limits. Moderate right hydronephrosis. Left Kidney Length: 11.0 cm. Echogenicity within normal limits. No mass or hydronephrosis  visualized. IMPRESSION: Moderate right hydronephrosis, possibly related to extrinsic compression by the gravid uterus, although indeterminate. Electronically Signed   By: Charline Bills M.D.   On: 12/29/2017 22:15   MDM UA CBC Flexeril 5mg  PO Zofran 8mg  ODT US Renal- moderate right hydronephrosis   Patient still reporting increased pain after u/s. Will give 1 percocet PO- patient reports relief from pain  Consulted with Dr. Erin Fulling: likely kidney stone. Will give 2 liters IV fluids and discharge home with strainer and limited rx of pain medication.  Assessment and Plan   1. Kidney stone complicating pregnancy, second trimester   2. [redacted] weeks gestation of pregnancy    -Discharge home in stable condition -Rx for percocet given to patient -Straining urine and pyelonephritis precautions discussed -Patient advised to follow-up with [facility] as scheduled for prenatal care -Patient may return to MAU as needed or if her condition were to change or worsen  Rolm Bookbinder CNM 12/29/2017, 8:40 PM

## 2017-12-29 NOTE — Progress Notes (Addendum)
G2P1 @ 18.[redacted] wksga. Here for N/V that started 1900 today. Also c/o back pain. Denies LOF or bleeding. Fluttering movements felt voiced.   Tuesday went to clinic dx for UTI and taking nitrofurantoin for past 4 days.   2038: Provider at bs assessing.   2100: lab at bs  2106: medicated per order

## 2017-12-29 NOTE — MAU Note (Signed)
Pt here with c/o nausea, vomiting since about 1900; also lower back pain since Friday. Denies any bleeding or leaking.

## 2017-12-30 DIAGNOSIS — N200 Calculus of kidney: Secondary | ICD-10-CM

## 2017-12-30 DIAGNOSIS — O26832 Pregnancy related renal disease, second trimester: Secondary | ICD-10-CM | POA: Diagnosis not present

## 2017-12-30 MED ORDER — OXYCODONE-ACETAMINOPHEN 5-325 MG PO TABS: 2.0000 | ORAL_TABLET | ORAL | 0 refills | Status: DC | PRN

## 2017-12-30 NOTE — Discharge Instructions (Signed)

## 2017-12-31 ENCOUNTER — Ambulatory Visit (HOSPITAL_COMMUNITY)
Admission: RE | Admit: 2017-12-31 | Discharge: 2017-12-31 | Disposition: A | Discharge status: Home / Self Care | Payer: 59 | Source: Ambulatory Visit | Attending: Family Medicine | Admitting: Family Medicine

## 2017-12-31 ENCOUNTER — Telehealth: Payer: Self-pay | Admitting: *Deleted

## 2017-12-31 ENCOUNTER — Other Ambulatory Visit: Payer: Self-pay | Admitting: Family Medicine

## 2017-12-31 ENCOUNTER — Encounter (HOSPITAL_COMMUNITY): Payer: Self-pay

## 2017-12-31 ENCOUNTER — Other Ambulatory Visit (HOSPITAL_COMMUNITY): Payer: Self-pay | Admitting: *Deleted

## 2017-12-31 DIAGNOSIS — Z369 Encounter for antenatal screening, unspecified: Secondary | ICD-10-CM

## 2017-12-31 DIAGNOSIS — O359XX0 Maternal care for (suspected) fetal abnormality and damage, unspecified, not applicable or unspecified: Secondary | ICD-10-CM | POA: Diagnosis not present

## 2017-12-31 DIAGNOSIS — O358XX0 Maternal care for other (suspected) fetal abnormality and damage, not applicable or unspecified: Secondary | ICD-10-CM | POA: Diagnosis not present

## 2017-12-31 DIAGNOSIS — O10919 Unspecified pre-existing hypertension complicating pregnancy, unspecified trimester: Secondary | ICD-10-CM

## 2017-12-31 DIAGNOSIS — O10912 Unspecified pre-existing hypertension complicating pregnancy, second trimester: Secondary | ICD-10-CM | POA: Diagnosis not present

## 2017-12-31 DIAGNOSIS — O0992 Supervision of high risk pregnancy, unspecified, second trimester: Secondary | ICD-10-CM

## 2017-12-31 DIAGNOSIS — Z3A19 19 weeks gestation of pregnancy: Secondary | ICD-10-CM | POA: Insufficient documentation

## 2017-12-31 DIAGNOSIS — O10012 Pre-existing essential hypertension complicating pregnancy, second trimester: Secondary | ICD-10-CM | POA: Diagnosis not present

## 2017-12-31 LAB — CULTURE, OB URINE: CULTURE: NO GROWTH

## 2017-12-31 NOTE — Telephone Encounter (Signed)
-----   Message from Lindell SparHeather L Bacon, [place] sent at 12/30/2017  2:49 PM EDT ----- Regarding: pt specifically asked for you  Contact: [phone] Patient requesting a call back did not tell what it was about

## 2017-12-31 NOTE — Telephone Encounter (Signed)
Called pt back. She states she has a kidney stone and thought she was told to bring it in when/if she passes it. Advised pt, no need to bring in office. She should feel some relief after she passes it.

## 2018-01-01 ENCOUNTER — Encounter: Payer: Self-pay | Admitting: Family Medicine

## 2018-01-01 DIAGNOSIS — O358XX0 Maternal care for other (suspected) fetal abnormality and damage, not applicable or unspecified: Secondary | ICD-10-CM | POA: Insufficient documentation

## 2018-01-01 DIAGNOSIS — O35EXX0 Maternal care for other (suspected) fetal abnormality and damage, fetal genitourinary anomalies, not applicable or unspecified: Secondary | ICD-10-CM | POA: Insufficient documentation

## 2018-01-08 ENCOUNTER — Telehealth: Payer: Self-pay | Admitting: General Practice

## 2018-01-08 DIAGNOSIS — N130 Hydronephrosis with ureteropelvic junction obstruction: Secondary | ICD-10-CM | POA: Diagnosis not present

## 2018-01-08 DIAGNOSIS — R310 Gross hematuria: Secondary | ICD-10-CM | POA: Diagnosis not present

## 2018-01-08 DIAGNOSIS — R8271 Bacteriuria: Secondary | ICD-10-CM | POA: Diagnosis not present

## 2018-01-08 NOTE — Telephone Encounter (Signed)
Opened in error

## 2018-01-13 ENCOUNTER — Encounter: Payer: Self-pay | Admitting: Radiology

## 2018-01-16 ENCOUNTER — Ambulatory Visit (INDEPENDENT_AMBULATORY_CARE_PROVIDER_SITE_OTHER): Payer: 59 | Admitting: Obstetrics & Gynecology

## 2018-01-16 VITALS — BP 129/87 | HR 72 | Wt 140.0 lb

## 2018-01-16 DIAGNOSIS — O0992 Supervision of high risk pregnancy, unspecified, second trimester: Secondary | ICD-10-CM

## 2018-01-16 DIAGNOSIS — O10919 Unspecified pre-existing hypertension complicating pregnancy, unspecified trimester: Secondary | ICD-10-CM

## 2018-01-16 NOTE — Patient Instructions (Signed)
Return to clinic for any scheduled appointments or obstetric concerns, or go to MAU for evaluation  

## 2018-01-16 NOTE — Progress Notes (Signed)
PRENATAL VISIT NOTE  Subjective:  Cynthia Crawford is a 27 y.o. G2P1001 at [redacted]w[redacted]d being seen today for ongoing prenatal care.  She is currently monitored for the following issues for this high-risk pregnancy and has Migraine; Chronic hypertension during pregnancy, antepartum; Supervision of high-risk pregnancy; History of gestational hypertension; Contact dermatitis due to soap; and Fetal renal anomaly, single gestation on their problem list.  Patient reports no complaints.  Contractions: Not present.  .  Movement: Present. Denies leaking of fluid.   The following portions of the patient's history were reviewed and updated as appropriate: allergies, current medications, past family history, past medical history, past social history, past surgical history and problem list. Problem list updated.  Objective:   Vitals:   01/16/18 0824  BP: 129/87  Pulse: 72  Weight: 140 lb (63.5 kg)    Fetal Status: Fetal Heart Rate (bpm): 157 Fundal Height: 21 cm Movement: Present     General:  Alert, oriented and cooperative. Patient is in no acute distress.  Skin: Skin is warm and dry. No rash noted.   Cardiovascular: Normal heart rate noted  Respiratory: Normal respiratory effort, no problems with respiration noted  Abdomen: Soft, gravid, appropriate for gestational age.  Pain/Pressure: Present     Pelvic: Cervical exam deferred        Extremities: Normal range of motion.  Edema: None  Mental Status: Normal mood and affect. Normal behavior. Normal judgment and thought content.   US Renal  Result Date: 12/29/2017 CLINICAL DATA:  Right flank pain, leukocytosis, [redacted] weeks pregnant EXAM: RENAL/URINARY TRACT ULTRASOUND TECHNIQUE: Ultrasound examination of the abdominal aorta was performed to evaluate for abdominal aortic aneurysm. The common iliac arteries, IVC, and kidneys were also evaluated. COMPARISON:  None. FINDINGS: Abdominal Aorta No aneurysm identified. Maximum AP Diameter:  1.3 cm  Maximum TRV Diameter: 1.6 cm Right Common Iliac Artery No aneurysm identified. Left Common Iliac Artery No aneurysm identified. IVC No abnormality visualized. Right Kidney Length: 12.3 cm. Echogenicity within normal limits. Moderate right hydronephrosis. Left Kidney Length: 11.0 cm. Echogenicity within normal limits. No mass or hydronephrosis visualized. IMPRESSION: Moderate right hydronephrosis, possibly related to extrinsic compression by the gravid uterus, although indeterminate. Electronically Signed   By: Charline Bills M.D.   On: 12/29/2017 22:15   [place] Mfm Ob Detail +14 Wk  Result Date: 12/31/2017 ----------------------------------------------------------------------  OBSTETRICS REPORT                       (Signed Final 12/31/2017 11:12 pm) ---------------------------------------------------------------------- Patient Info  ID #:       [id number]                          D.O.B.:  1991/06/03 (26 yrs)  Name:       Cynthia Crawford                   Visit Date: 12/31/2017 10:43 am              [place] ---------------------------------------------------------------------- Performed By  Performed By:     Tommi Emery         Ref. Address:      [address]                    [address]  R[address]  Attending:        Particia Nearing MD       Location:          [facility]  Referred By:      Federico Flake MD ---------------------------------------------------------------------- Orders   #  Description                                 Code   1  [place] MFM OB DETAIL +14 WK                     76811.01  ----------------------------------------------------------------------   #  Ordered By               Order #        Accession #    Episode #   1  Lyndel Safe          [id number]      [id number]     [id number]  ---------------------------------------------------------------------- Indications   [redacted] weeks gestation of pregnancy                 Z3A.19   Encounter for antenatal screening for          Z36.3   malformations   Hypertension - Chronic/Pre-existing (ASA)      O10.019   Fetal abnormality - other known or             O35.9XX0   suspected; low risk NIPS  ---------------------------------------------------------------------- OB History  Blood Type:            Height:  5'4"   Weight (lb):  140       BMI:  24.03  Gravidity:    2         Term:   1        Prem:   0         SAB:   0  TOP:          0       Ectopic:  0        Living: 1 ---------------------------------------------------------------------- Fetal Evaluation  Num Of Fetuses:     1  Fetal Heart         157  Rate(bpm):  Cardiac Activity:   Observed  Presentation:       Cephalic  Placenta:           Posterior, above cervical os  P. Cord Insertion:  Visualized  Amniotic Fluid  AFI FV:      Subjectively within normal limits                              Largest Pocket(cm)                              4.79 ---------------------------------------------------------------------- Biometry  BPD:      45.7  mm     G. Age:  19w 6d         82  %    CI:         81.39  %    70 - 86  FL/HC:       18.9  %    16.1 - 18.3  HC:      159.9  mm     G. Age:  18w 6d         34  %    HC/AC:       1.11       1.09 - 1.39  AC:      144.4  mm     G. Age:  19w 5d         71  %    FL/BPD:      66.3  %  FL:       30.3  mm     G. Age:  19w 3d         56  %    FL/AC:       21.0  %    20 - 24  HUM:      29.1  mm     G. Age:  19w 3d         62  %  CER:      19.8  mm     G. Age:  18w 6d         47  %  CM:          3  mm  Est. FW:     297   gm    0 lb 10 oz     53  % ---------------------------------------------------------------------- Gestational Age  LMP:           19w 0d        Date:  08/20/17                 EDD:    05/27/18  U/S Today:     19w 3d                                        EDD:    05/24/18  Best:          19w 0d     Det. By:  LMP  (08/20/17)          EDD:     05/27/18 ---------------------------------------------------------------------- Anatomy  Cranium:               Appears normal         Aortic Arch:            Appears normal  Cavum:                 Appears normal         Ductal Arch:            Appears normal  Ventricles:            Appears normal         Diaphragm:              Appears normal  Choroid Plexus:        Right choroid          Stomach:                Appears normal, left                         plexus cyst,3   mm  sided  Cerebellum:            Appears normal         Abdomen:                Appears normal  Posterior Fossa:       Appears normal         Abdominal Wall:         Appears nml (cord                                                                        insert, abd wall)  Nuchal Fold:           Appears normal         Cord Vessels:           Appears normal (3                                                                        vessel cord)  Face:                  Appears normal         Kidneys:                Bilat                         (orbits and profile)                                                                        pyelectasis,Rt                                                                        4mm, Lt4.11mm  Lips:                  Appears normal         Bladder:                Appears normal  Thoracic:              Appears normal         Spine:                  Appears normal  Heart:                 Appears normal         Upper Extremities:  Appears normal                         (4CH, axis, and situs  RVOT:                  Appears normal         Lower Extremities:      Appears normal  LVOT:                  Appears normal  Other:  Female gender. Heels and  RT 5th digit visualized. ---------------------------------------------------------------------- Cervix Uterus Adnexa  Cervix  Length:           3.88  cm.  Normal appearance by transabdominal scan.   Uterus  No abnormality visualized.  Left Ovary  Within normal limits.  Right Ovary  Not visualized. No adnexal mass visualized.  Cul De Sac:   No free fluid seen.  Adnexa:       No abnormality visualized. ---------------------------------------------------------------------- Impression  SIUP at 19+0 weeks with cardiac activity  Bilateral UTD A1  All other detailed fetal anatomy was seen and appeared  normal  Markers of aneuploidy: unilateral, small CP cyst  Normal amniotic fluid volume  Measurements consistent with LMP dating  The US findings were shared with Ms. Liberati and her  husband. The implications of UTD and CPC were discussed  in detail. In light of low risk NIPS, no further testing indicated. ---------------------------------------------------------------------- Recommendations  Follow-up ultrasound for growth in 5 weeks  Follow-up ultrasound at 28 weeks to reassess kidneys and to  assess growth ----------------------------------------------------------------------                 Particia Nearing, MD Electronically Signed Final Report   12/31/2017 11:12 pm ----------------------------------------------------------------------   Assessment and Plan:  Pregnancy: G2P1001 at [redacted]w[redacted]d  1. Chronic hypertension during pregnancy, antepartum Stable BP, continue ASA  2. Supervision of high risk pregnancy in second trimester Reassured about UTD and isolated CPC, had normal NIPS. Kidney stone symptoms resolved. Preterm labor symptoms and general obstetric precautions including but not limited to vaginal bleeding, contractions, leaking of fluid and fetal movement were reviewed in detail with the patient. Please refer to After Visit Summary for other counseling recommendations.  Return in about 1 month (around 02/13/2018) for OB Visit.  Future Appointments  Date Time Provider Department Center  02/06/2018  8:15 AM WH-[facility] [place] 4 WH-M[facility]US [facility]  03/06/2018  8:15 AM WH-[facility] [place] 4 WH-[facility]-US    Jaynie Collins, MD

## 2018-01-28 DIAGNOSIS — N201 Calculus of ureter: Secondary | ICD-10-CM | POA: Diagnosis not present

## 2018-01-28 DIAGNOSIS — N130 Hydronephrosis with ureteropelvic junction obstruction: Secondary | ICD-10-CM | POA: Diagnosis not present

## 2018-02-04 ENCOUNTER — Encounter: Payer: Self-pay | Admitting: Radiology

## 2018-02-06 ENCOUNTER — Encounter (HOSPITAL_COMMUNITY): Payer: Self-pay

## 2018-02-06 ENCOUNTER — Other Ambulatory Visit (HOSPITAL_COMMUNITY): Payer: Self-pay | Admitting: Maternal and Fetal Medicine

## 2018-02-06 ENCOUNTER — Ambulatory Visit (HOSPITAL_COMMUNITY)
Admission: RE | Admit: 2018-02-06 | Discharge: 2018-02-06 | Disposition: A | Discharge status: Home / Self Care | Payer: 59 | Source: Ambulatory Visit | Attending: Family Medicine | Admitting: Family Medicine

## 2018-02-06 DIAGNOSIS — O10012 Pre-existing essential hypertension complicating pregnancy, second trimester: Secondary | ICD-10-CM | POA: Diagnosis not present

## 2018-02-06 DIAGNOSIS — Z362 Encounter for other antenatal screening follow-up: Secondary | ICD-10-CM

## 2018-02-06 DIAGNOSIS — Z3A24 24 weeks gestation of pregnancy: Secondary | ICD-10-CM | POA: Diagnosis not present

## 2018-02-06 DIAGNOSIS — O10919 Unspecified pre-existing hypertension complicating pregnancy, unspecified trimester: Secondary | ICD-10-CM

## 2018-02-06 DIAGNOSIS — O359XX0 Maternal care for (suspected) fetal abnormality and damage, unspecified, not applicable or unspecified: Secondary | ICD-10-CM

## 2018-02-06 DIAGNOSIS — O358XX0 Maternal care for other (suspected) fetal abnormality and damage, not applicable or unspecified: Secondary | ICD-10-CM | POA: Insufficient documentation

## 2018-02-06 DIAGNOSIS — O35EXX0 Maternal care for other (suspected) fetal abnormality and damage, fetal genitourinary anomalies, not applicable or unspecified: Secondary | ICD-10-CM

## 2018-02-06 DIAGNOSIS — O1012 Pre-existing hypertensive heart disease complicating childbirth: Secondary | ICD-10-CM | POA: Insufficient documentation

## 2018-02-06 DIAGNOSIS — O0992 Supervision of high risk pregnancy, unspecified, second trimester: Secondary | ICD-10-CM

## 2018-02-13 ENCOUNTER — Ambulatory Visit (INDEPENDENT_AMBULATORY_CARE_PROVIDER_SITE_OTHER): Payer: 59 | Admitting: Family Medicine

## 2018-02-13 VITALS — BP 126/79 | HR 72 | Wt 151.2 lb

## 2018-02-13 DIAGNOSIS — O10919 Unspecified pre-existing hypertension complicating pregnancy, unspecified trimester: Secondary | ICD-10-CM

## 2018-02-13 DIAGNOSIS — O358XX0 Maternal care for other (suspected) fetal abnormality and damage, not applicable or unspecified: Secondary | ICD-10-CM

## 2018-02-13 DIAGNOSIS — O0992 Supervision of high risk pregnancy, unspecified, second trimester: Secondary | ICD-10-CM

## 2018-02-13 DIAGNOSIS — O35EXX0 Maternal care for other (suspected) fetal abnormality and damage, fetal genitourinary anomalies, not applicable or unspecified: Secondary | ICD-10-CM

## 2018-02-13 NOTE — Patient Instructions (Signed)
 Second Trimester of Pregnancy The second trimester is from week 14 through week 27 (months 4 through 6). The second trimester is often a time when you feel your best. Your body has adjusted to being pregnant, and you begin to feel better physically. Usually, morning sickness has lessened or quit completely, you may have more energy, and you may have an increase in appetite. The second trimester is also a time when the fetus is growing rapidly. At the end of the sixth month, the fetus is about 9 inches long and weighs about 1 pounds. You will likely begin to feel the baby move (quickening) between 16 and 20 weeks of pregnancy. Body changes during your second trimester Your body continues to go through many changes during your second trimester. The changes vary from woman to woman.  Your weight will continue to increase. You will notice your lower abdomen bulging out.  You may begin to get stretch marks on your hips, abdomen, and breasts.  You may develop headaches that can be relieved by medicines. The medicines should be approved by your health care provider.  You may urinate more often because the fetus is pressing on your bladder.  You may develop or continue to have heartburn as a result of your pregnancy.  You may develop constipation because certain hormones are causing the muscles that push waste through your intestines to slow down.  You may develop hemorrhoids or swollen, bulging veins (varicose veins).  You may have back pain. This is caused by: ? Weight gain. ? Pregnancy hormones that are relaxing the joints in your pelvis. ? A shift in weight and the muscles that support your balance.  Your breasts will continue to grow and they will continue to become tender.  Your gums may bleed and may be sensitive to brushing and flossing.  Dark spots or blotches (chloasma, mask of pregnancy) may develop on your face. This will likely fade after the baby is born.  A dark line from  your belly button to the pubic area (linea nigra) may appear. This will likely fade after the baby is born.  You may have changes in your hair. These can include thickening of your hair, rapid growth, and changes in texture. Some women also have hair loss during or after pregnancy, or hair that feels dry or thin. Your hair will most likely return to normal after your baby is born.  What to expect at prenatal visits During a routine prenatal visit:  You will be weighed to make sure you and the fetus are growing normally.  Your blood pressure will be taken.  Your abdomen will be measured to track your baby's growth.  The fetal heartbeat will be listened to.  Any test results from the previous visit will be discussed.  Your health care provider may ask you:  How you are feeling.  If you are feeling the baby move.  If you have had any abnormal symptoms, such as leaking fluid, bleeding, severe headaches, or abdominal cramping.  If you are using any tobacco products, including cigarettes, chewing tobacco, and electronic cigarettes.  If you have any questions.  Other tests that may be performed during your second trimester include:  Blood tests that check for: ? Low iron levels (anemia). ? High blood sugar that affects pregnant women (gestational diabetes) between 24 and 28 weeks. ? Rh antibodies. This is to check for a protein on red blood cells (Rh factor).  Urine tests to check for infections, diabetes,   or protein in the urine.  An ultrasound to confirm the proper growth and development of the baby.  An amniocentesis to check for possible genetic problems.  Fetal screens for spina bifida and Down syndrome.  HIV (human immunodeficiency virus) testing. Routine prenatal testing includes screening for HIV, unless you choose not to have this test.  Follow these instructions at home: Medicines  Follow your health care provider's instructions regarding medicine use. Specific  medicines may be either safe or unsafe to take during pregnancy.  Take a prenatal vitamin that contains at least 600 micrograms (mcg) of folic acid.  If you develop constipation, try taking a stool softener if your health care provider approves. Eating and drinking  Eat a balanced diet that includes fresh fruits and vegetables, whole grains, good sources of protein such as meat, eggs, or tofu, and low-fat dairy. Your health care provider will help you determine the amount of weight gain that is right for you.  Avoid raw meat and uncooked cheese. These carry germs that can cause birth defects in the baby.  If you have low calcium intake from food, talk to your health care provider about whether you should take a daily calcium supplement.  Limit foods that are high in fat and processed sugars, such as fried and sweet foods.  To prevent constipation: ? Drink enough fluid to keep your urine clear or pale yellow. ? Eat foods that are high in fiber, such as fresh fruits and vegetables, whole grains, and beans. Activity  Exercise only as directed by your health care provider. Most women can continue their usual exercise routine during pregnancy. Try to exercise for 30 minutes at least 5 days a week. Stop exercising if you experience uterine contractions.  Avoid heavy lifting, wear low heel shoes, and practice good posture.  A sexual relationship may be continued unless your health care provider directs you otherwise. Relieving pain and discomfort  Wear a good support bra to prevent discomfort from breast tenderness.  Take warm sitz baths to soothe any pain or discomfort caused by hemorrhoids. Use hemorrhoid cream if your health care provider approves.  Rest with your legs elevated if you have leg cramps or low back pain.  If you develop varicose veins, wear support hose. Elevate your feet for 15 minutes, 3-4 times a day. Limit salt in your diet. Prenatal Care  Write down your questions.  Take them to your prenatal visits.  Keep all your prenatal visits as told by your health care provider. This is important. Safety  Wear your seat belt at all times when driving.  Make a list of emergency phone numbers, including numbers for family, friends, the hospital, and police and fire departments. General instructions  Ask your health care provider for a referral to a local prenatal education class. Begin classes no later than the beginning of month 6 of your pregnancy.  Ask for help if you have counseling or nutritional needs during pregnancy. Your health care provider can offer advice or refer you to specialists for help with various needs.  Do not use hot tubs, steam rooms, or saunas.  Do not douche or use tampons or scented sanitary pads.  Do not cross your legs for long periods of time.  Avoid cat litter boxes and soil used by cats. These carry germs that can cause birth defects in the baby and possibly loss of the fetus by miscarriage or stillbirth.  Avoid all smoking, herbs, alcohol, and unprescribed drugs. Chemicals in these products   can affect the formation and growth of the baby.  Do not use any products that contain nicotine or tobacco, such as cigarettes and e-cigarettes. If you need help quitting, ask your health care provider.  Visit your dentist if you have not gone yet during your pregnancy. Use a soft toothbrush to brush your teeth and be gentle when you floss. Contact a health care provider if:  You have dizziness.  You have mild pelvic cramps, pelvic pressure, or nagging pain in the abdominal area.  You have persistent nausea, vomiting, or diarrhea.  You have a bad smelling vaginal discharge.  You have pain when you urinate. Get help right away if:  You have a fever.  You are leaking fluid from your vagina.  You have spotting or bleeding from your vagina.  You have severe abdominal cramping or pain.  You have rapid weight gain or weight  loss.  You have shortness of breath with chest pain.  You notice sudden or extreme swelling of your face, hands, ankles, feet, or legs.  You have not felt your baby move in over an hour.  You have severe headaches that do not go away when you take medicine.  You have vision changes. Summary  The second trimester is from week 14 through week 27 (months 4 through 6). It is also a time when the fetus is growing rapidly.  Your body goes through many changes during pregnancy. The changes vary from woman to woman.  Avoid all smoking, herbs, alcohol, and unprescribed drugs. These chemicals affect the formation and growth your baby.  Do not use any tobacco products, such as cigarettes, chewing tobacco, and e-cigarettes. If you need help quitting, ask your health care provider.  Contact your health care provider if you have any questions. Keep all prenatal visits as told by your health care provider. This is important. This information is not intended to replace advice given to you by your health care provider. Make sure you discuss any questions you have with your health care provider. Document Released: 08/28/2001 Document Revised: 10/09/2016 Document Reviewed: 10/09/2016 [employer] Interactive Patient Education  2018 [employer]   Breastfeeding Choosing to breastfeed is one of the best decisions you can make for yourself and your baby. A change in hormones during pregnancy causes your breasts to make breast milk in your milk-producing glands. Hormones prevent breast milk from being released before your baby is born. They also prompt milk flow after birth. Once breastfeeding has begun, thoughts of your baby, as well as his or her sucking or crying, can stimulate the release of milk from your milk-producing glands. Benefits of breastfeeding Research shows that breastfeeding offers many health benefits for infants and mothers. It also offers a cost-free and convenient way to feed your  baby. For your baby  Your first milk (colostrum) helps your baby's digestive system to function better.  Special cells in your milk (antibodies) help your baby to fight off infections.  Breastfed babies are less likely to develop asthma, allergies, obesity, or type 2 diabetes. They are also at lower risk for sudden infant death syndrome (SIDS).  Nutrients in breast milk are better able to meet your baby's needs compared to infant formula.  Breast milk improves your baby's brain development. For you  Breastfeeding helps to create a very special bond between you and your baby.  Breastfeeding is convenient. Breast milk costs nothing and is always available at the correct temperature.  Breastfeeding helps to burn calories. It helps you   to lose the weight that you gained during pregnancy.  Breastfeeding makes your uterus return faster to its size before pregnancy. It also slows bleeding (lochia) after you give birth.  Breastfeeding helps to lower your risk of developing type 2 diabetes, osteoporosis, rheumatoid arthritis, cardiovascular disease, and breast, ovarian, uterine, and endometrial cancer later in life. Breastfeeding basics Starting breastfeeding  Find a comfortable place to sit or lie down, with your neck and back well-supported.  Place a pillow or a rolled-up blanket under your baby to bring him or her to the level of your breast (if you are seated). Nursing pillows are specially designed to help support your arms and your baby while you breastfeed.  Make sure that your baby's tummy (abdomen) is facing your abdomen.  Gently massage your breast. With your fingertips, massage from the outer edges of your breast inward toward the nipple. This encourages milk flow. If your milk flows slowly, you may need to continue this action during the feeding.  Support your breast with 4 fingers underneath and your thumb above your nipple (make the letter "C" with your hand). Make sure your  fingers are well away from your nipple and your baby's mouth.  Stroke your baby's lips gently with your finger or nipple.  When your baby's mouth is open wide enough, quickly bring your baby to your breast, placing your entire nipple and as much of the areola as possible into your baby's mouth. The areola is the colored area around your nipple. ? More areola should be visible above your baby's upper lip than below the lower lip. ? Your baby's lips should be opened and extended outward (flanged) to ensure an adequate, comfortable latch. ? Your baby's tongue should be between his or her lower gum and your breast.  Make sure that your baby's mouth is correctly positioned around your nipple (latched). Your baby's lips should create a seal on your breast and be turned out (everted).  It is common for your baby to suck about 2-3 minutes in order to start the flow of breast milk. Latching Teaching your baby how to latch onto your breast properly is very important. An improper latch can cause nipple pain, decreased milk supply, and poor weight gain in your baby. Also, if your baby is not latched onto your nipple properly, he or she may swallow some air during feeding. This can make your baby fussy. Burping your baby when you switch breasts during the feeding can help to get rid of the air. However, teaching your baby to latch on properly is still the best way to prevent fussiness from swallowing air while breastfeeding. Signs that your baby has successfully latched onto your nipple  Silent tugging or silent sucking, without causing you pain. Infant's lips should be extended outward (flanged).  Swallowing heard between every 3-4 sucks once your milk has started to flow (after your let-down milk reflex occurs).  Muscle movement above and in front of his or her ears while sucking.  Signs that your baby has not successfully latched onto your nipple  Sucking sounds or smacking sounds from your baby while  breastfeeding.  Nipple pain.  If you think your baby has not latched on correctly, slip your finger into the corner of your baby's mouth to break the suction and place it between your baby's gums. Attempt to start breastfeeding again. Signs of successful breastfeeding Signs from your baby  Your baby will gradually decrease the number of sucks or will completely stop sucking.    Your baby will fall asleep.  Your baby's body will relax.  Your baby will retain a small amount of milk in his or her mouth.  Your baby will let go of your breast by himself or herself.  Signs from you  Breasts that have increased in firmness, weight, and size 1-3 hours after feeding.  Breasts that are softer immediately after breastfeeding.  Increased milk volume, as well as a change in milk consistency and color by the fifth day of breastfeeding.  Nipples that are not sore, cracked, or bleeding.  Signs that your baby is getting enough milk  Wetting at least 1-2 diapers during the first 24 hours after birth.  Wetting at least 5-6 diapers every 24 hours for the first week after birth. The urine should be clear or pale yellow by the age of 5 days.  Wetting 6-8 diapers every 24 hours as your baby continues to grow and develop.  At least 3 stools in a 24-hour period by the age of 5 days. The stool should be soft and yellow.  At least 3 stools in a 24-hour period by the age of 7 days. The stool should be seedy and yellow.  No loss of weight greater than 10% of birth weight during the first 3 days of life.  Average weight gain of 4-7 oz (113-198 g) per week after the age of 4 days.  Consistent daily weight gain by the age of 5 days, without weight loss after the age of 2 weeks. After a feeding, your baby may spit up a small amount of milk. This is normal. Breastfeeding frequency and duration Frequent feeding will help you make more milk and can prevent sore nipples and extremely full breasts (breast  engorgement). Breastfeed when you feel the need to reduce the fullness of your breasts or when your baby shows signs of hunger. This is called "breastfeeding on demand." Signs that your baby is hungry include:  Increased alertness, activity, or restlessness.  Movement of the head from side to side.  Opening of the mouth when the corner of the mouth or cheek is stroked (rooting).  Increased sucking sounds, smacking lips, cooing, sighing, or squeaking.  Hand-to-mouth movements and sucking on fingers or hands.  Fussing or crying.  Avoid introducing a pacifier to your baby in the first 4-6 weeks after your baby is born. After this time, you may choose to use a pacifier. Research has shown that pacifier use during the first year of a baby's life decreases the risk of sudden infant death syndrome (SIDS). Allow your baby to feed on each breast as long as he or she wants. When your baby unlatches or falls asleep while feeding from the first breast, offer the second breast. Because newborns are often sleepy in the first few weeks of life, you may need to awaken your baby to get him or her to feed. Breastfeeding times will vary from baby to baby. However, the following rules can serve as a guide to help you make sure that your baby is properly fed:  Newborns (babies 4 weeks of age or younger) may breastfeed every 1-3 hours.  Newborns should not go without breastfeeding for longer than 3 hours during the day or 5 hours during the night.  You should breastfeed your baby a minimum of 8 times in a 24-hour period.  Breast milk pumping Pumping and storing breast milk allows you to make sure that your baby is exclusively fed your breast milk, even at times   when you are unable to breastfeed. This is especially important if you go back to work while you are still breastfeeding, or if you are not able to be present during feedings. Your lactation consultant can help you find a method of pumping that works best  for you and give you guidelines about how long it is safe to store breast milk. Caring for your breasts while you breastfeed Nipples can become dry, cracked, and sore while breastfeeding. The following recommendations can help keep your breasts moisturized and healthy:  Avoid using soap on your nipples.  Wear a supportive bra designed especially for nursing. Avoid wearing underwire-style bras or extremely tight bras (sports bras).  Air-dry your nipples for 3-4 minutes after each feeding.  Use only cotton bra pads to absorb leaked breast milk. Leaking of breast milk between feedings is normal.  Use lanolin on your nipples after breastfeeding. Lanolin helps to maintain your skin's normal moisture barrier. Pure lanolin is not harmful (not toxic) to your baby. You may also hand express a few drops of breast milk and gently massage that milk into your nipples and allow the milk to air-dry.  In the first few weeks after giving birth, some women experience breast engorgement. Engorgement can make your breasts feel heavy, warm, and tender to the touch. Engorgement peaks within 3-5 days after you give birth. The following recommendations can help to ease engorgement:  Completely empty your breasts while breastfeeding or pumping. You may want to start by applying warm, moist heat (in the shower or with warm, water-soaked hand towels) just before feeding or pumping. This increases circulation and helps the milk flow. If your baby does not completely empty your breasts while breastfeeding, pump any extra milk after he or she is finished.  Apply ice packs to your breasts immediately after breastfeeding or pumping, unless this is too uncomfortable for you. To do this: ? Put ice in a plastic bag. ? Place a towel between your skin and the bag. ? Leave the ice on for 20 minutes, 2-3 times a day.  Make sure that your baby is latched on and positioned properly while breastfeeding.  If engorgement persists  after 48 hours of following these recommendations, contact your health care provider or a lactation consultant. Overall health care recommendations while breastfeeding  Eat 3 healthy meals and 3 snacks every day. Well-nourished mothers who are breastfeeding need an additional 450-500 calories a day. You can meet this requirement by increasing the amount of a balanced diet that you eat.  Drink enough water to keep your urine pale yellow or clear.  Rest often, relax, and continue to take your prenatal vitamins to prevent fatigue, stress, and low vitamin and mineral levels in your body (nutrient deficiencies).  Do not use any products that contain nicotine or tobacco, such as cigarettes and e-cigarettes. Your baby may be harmed by chemicals from cigarettes that pass into breast milk and exposure to secondhand smoke. If you need help quitting, ask your health care provider.  Avoid alcohol.  Do not use illegal drugs or marijuana.  Talk with your health care provider before taking any medicines. These include over-the-counter and prescription medicines as well as vitamins and herbal supplements. Some medicines that may be harmful to your baby can pass through breast milk.  It is possible to become pregnant while breastfeeding. If birth control is desired, ask your health care provider about options that will be safe while breastfeeding your baby. Where to find more information: [employer]: www.llli.org Contact a health care provider if:  You feel like you want to stop breastfeeding or have become frustrated with breastfeeding.  Your nipples are cracked or bleeding.  Your breasts are red, tender, or warm.  You have: ? Painful breasts or nipples. ? A swollen area on either breast. ? A fever or chills. ? Nausea or vomiting. ? Drainage other than breast milk from your nipples.  Your breasts do not become full before feedings by the fifth day after you give birth.  You  feel sad and depressed.  Your baby is: ? Too sleepy to eat well. ? Having trouble sleeping. ? More than 1 week old and wetting fewer than 6 diapers in a 24-hour period. ? Not gaining weight by 5 days of age.  Your baby has fewer than 3 stools in a 24-hour period.  Your baby's skin or the white parts of his or her eyes become yellow. Get help right away if:  Your baby is overly tired (lethargic) and does not want to wake up and feed.  Your baby develops an unexplained fever. Summary  Breastfeeding offers many health benefits for infant and mothers.  Try to breastfeed your infant when he or she shows early signs of hunger.  Gently tickle or stroke your baby's lips with your finger or nipple to allow the baby to open his or her mouth. Bring the baby to your breast. Make sure that much of the areola is in your baby's mouth. Offer one side and burp the baby before you offer the other side.  Talk with your health care provider or lactation consultant if you have questions or you face problems as you breastfeed. This information is not intended to replace advice given to you by your health care provider. Make sure you discuss any questions you have with your health care provider. Document Released: 09/03/2005 Document Revised: 10/05/2016 Document Reviewed: 10/05/2016 [employer] Interactive Patient Education  2018 [employer]  
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
## 2018-03-05 ENCOUNTER — Encounter: Payer: Self-pay | Admitting: Obstetrics and Gynecology

## 2018-03-05 ENCOUNTER — Ambulatory Visit (INDEPENDENT_AMBULATORY_CARE_PROVIDER_SITE_OTHER): Payer: 59 | Admitting: Obstetrics and Gynecology

## 2018-03-05 VITALS — BP 125/80 | HR 75 | Wt 156.0 lb

## 2018-03-05 DIAGNOSIS — Z23 Encounter for immunization: Secondary | ICD-10-CM | POA: Diagnosis not present

## 2018-03-05 DIAGNOSIS — Z8759 Personal history of other complications of pregnancy, childbirth and the puerperium: Secondary | ICD-10-CM

## 2018-03-05 DIAGNOSIS — O358XX0 Maternal care for other (suspected) fetal abnormality and damage, not applicable or unspecified: Secondary | ICD-10-CM

## 2018-03-05 DIAGNOSIS — O0992 Supervision of high risk pregnancy, unspecified, second trimester: Secondary | ICD-10-CM

## 2018-03-05 DIAGNOSIS — O35EXX0 Maternal care for other (suspected) fetal abnormality and damage, fetal genitourinary anomalies, not applicable or unspecified: Secondary | ICD-10-CM

## 2018-03-05 DIAGNOSIS — O10919 Unspecified pre-existing hypertension complicating pregnancy, unspecified trimester: Secondary | ICD-10-CM

## 2018-03-05 NOTE — Progress Notes (Signed)
Prenatal Visit Note Date: 03/05/2018 Clinic: [facility]  Subjective:  Jazlyne Christel MormonHargrave Wandel is a 27 y.o. G2P1001 at 6871w1d being seen today for ongoing prenatal care.  She is currently monitored for the following issues for this high-risk pregnancy and has Migraine; Chronic hypertension during pregnancy, antepartum; Supervision of high-risk pregnancy; History of gestational hypertension; Contact dermatitis due to soap; and Fetal renal anomaly, single gestation on their problem list.  Patient reports no complaints.   Contractions: Not present. Vag. Bleeding: None.  Movement: Present. Denies leaking of fluid.   The following portions of the patient's history were reviewed and updated as appropriate: allergies, current medications, past family history, past medical history, past social history, past surgical history and problem list. Problem list updated.  Objective:   Vitals:   03/05/18 0925  BP: 125/80  Pulse: 75  Weight: 156 lb (70.8 kg)    Fetal Status: Fetal Heart Rate (bpm): 149 Fundal Height: 28 cm Movement: Present     General:  Alert, oriented and cooperative. Patient is in no acute distress.  Skin: Skin is warm and dry. No rash noted.   Cardiovascular: Normal heart rate noted  Respiratory: Normal respiratory effort, no problems with respiration noted  Abdomen: Soft, gravid, appropriate for gestational age. Pain/Pressure: Present     Pelvic:  Cervical exam deferred        Extremities: Normal range of motion.  Edema: None  Mental Status: Normal mood and affect. Normal behavior. Normal judgment and thought content.   Urinalysis:      Assessment and Plan:  Pregnancy: G2P1001 at 1371w1d  1. Supervision of high risk pregnancy in second trimester Routine care. D/w pt re: BC. F/u u/s for fetal kidneys - Glucose Tolerance, 2 Hours w/1 Hour - CBC - RPR - HIV antibody - Tdap vaccine greater than or equal to 7yo IM  2. Chronic hypertension during  pregnancy, antepartum Continue low dose asa. F/u growth tomorrow. Start ap testing at 32wks   Preterm labor symptoms and general obstetric precautions including but not limited to vaginal bleeding, contractions, leaking of fluid and fetal movement were reviewed in detail with the patient. Please refer to After Visit Summary for other counseling recommendations.  Return in about 2 weeks (around 03/19/2018) for rob.   [place] BingPickens, Stephan Nelis, MD

## 2018-03-06 ENCOUNTER — Other Ambulatory Visit (HOSPITAL_COMMUNITY): Payer: Self-pay | Admitting: *Deleted

## 2018-03-06 ENCOUNTER — Encounter (HOSPITAL_COMMUNITY): Payer: Self-pay

## 2018-03-06 ENCOUNTER — Other Ambulatory Visit (HOSPITAL_COMMUNITY): Payer: Self-pay | Admitting: Maternal and Fetal Medicine

## 2018-03-06 ENCOUNTER — Ambulatory Visit (HOSPITAL_COMMUNITY)
Admission: RE | Admit: 2018-03-06 | Discharge: 2018-03-06 | Disposition: A | Discharge status: Home / Self Care | Payer: 59 | Source: Ambulatory Visit | Attending: Family Medicine | Admitting: Family Medicine

## 2018-03-06 DIAGNOSIS — Z362 Encounter for other antenatal screening follow-up: Secondary | ICD-10-CM

## 2018-03-06 DIAGNOSIS — O10919 Unspecified pre-existing hypertension complicating pregnancy, unspecified trimester: Secondary | ICD-10-CM

## 2018-03-06 DIAGNOSIS — O10913 Unspecified pre-existing hypertension complicating pregnancy, third trimester: Secondary | ICD-10-CM | POA: Diagnosis not present

## 2018-03-06 DIAGNOSIS — O359XX0 Maternal care for (suspected) fetal abnormality and damage, unspecified, not applicable or unspecified: Secondary | ICD-10-CM | POA: Diagnosis not present

## 2018-03-06 DIAGNOSIS — Z3A28 28 weeks gestation of pregnancy: Secondary | ICD-10-CM

## 2018-03-06 DIAGNOSIS — O10013 Pre-existing essential hypertension complicating pregnancy, third trimester: Secondary | ICD-10-CM | POA: Diagnosis not present

## 2018-03-06 LAB — CBC
HEMATOCRIT: 33.9 % — AB (ref 34.0–46.6)
HEMOGLOBIN: 11.3 g/dL (ref 11.1–15.9)
MCH: 30.6 pg (ref 26.6–33.0)
MCHC: 33.3 g/dL (ref 31.5–35.7)
MCV: 92 fL (ref 79–97)
Platelets: 155 10*3/uL (ref 150–450)
RBC: 3.69 x10E6/uL — ABNORMAL LOW (ref 3.77–5.28)
RDW: 14 % (ref 12.3–15.4)
WBC: 8.7 10*3/uL (ref 3.4–10.8)

## 2018-03-06 LAB — GLUCOSE TOLERANCE, 2 HOURS W/ 1HR
GLUCOSE, 1 HOUR: 72 mg/dL (ref 65–179)
Glucose, 2 hour: 79 mg/dL (ref 65–152)
Glucose, Fasting: 69 mg/dL (ref 65–91)

## 2018-03-06 LAB — RPR: RPR: NONREACTIVE

## 2018-03-06 LAB — HIV ANTIBODY (ROUTINE TESTING W REFLEX): HIV Screen 4th Generation wRfx: NONREACTIVE

## 2018-03-07 ENCOUNTER — Encounter: Payer: 59 | Admitting: Obstetrics & Gynecology

## 2018-03-19 ENCOUNTER — Ambulatory Visit (INDEPENDENT_AMBULATORY_CARE_PROVIDER_SITE_OTHER): Payer: 59 | Admitting: Obstetrics & Gynecology

## 2018-03-19 VITALS — BP 136/87 | HR 72 | Wt 159.4 lb

## 2018-03-19 DIAGNOSIS — O0993 Supervision of high risk pregnancy, unspecified, third trimester: Secondary | ICD-10-CM

## 2018-03-19 DIAGNOSIS — O10913 Unspecified pre-existing hypertension complicating pregnancy, third trimester: Secondary | ICD-10-CM

## 2018-03-19 DIAGNOSIS — O10919 Unspecified pre-existing hypertension complicating pregnancy, unspecified trimester: Secondary | ICD-10-CM

## 2018-03-19 NOTE — Progress Notes (Signed)
   PRENATAL VISIT NOTE  Subjective:  Cynthia Crawford is a 27 y.o. G2P1001 at 5386w1d being seen today for ongoing prenatal care.  She is currently monitored for the following issues for this low-risk pregnancy and has Migraine; Chronic hypertension during pregnancy, antepartum; Supervision of high-risk pregnancy; Contact dermatitis due to soap; and Fetal renal anomaly, single gestation on their problem list.  Patient reports no complaints.  Contractions: Not present.  .  Movement: Present. Denies leaking of fluid.   The following portions of the patient's history were reviewed and updated as appropriate: allergies, current medications, past family history, past medical history, past social history, past surgical history and problem list. Problem list updated.  Objective:   Vitals:   03/19/18 0845  BP: 136/87  Pulse: 72  Weight: 159 lb 6.4 oz (72.3 kg)    Fetal Status: Fetal Heart Rate (bpm): 145   Movement: Present     General:  Alert, oriented and cooperative. Patient is in no acute distress.  Skin: Skin is warm and dry. No rash noted.   Cardiovascular: Normal heart rate noted  Respiratory: Normal respiratory effort, no problems with respiration noted  Abdomen: Soft, gravid, appropriate for gestational age.  Pain/Pressure: Absent     Pelvic: Cervical exam deferred        Extremities: Normal range of motion.  Edema: None  Mental Status: Normal mood and affect. Normal behavior. Normal judgment and thought content.   Assessment and Plan:  Pregnancy: G2P1001 at 6986w1d  1. Chronic hypertension during pregnancy, antepartum - baby asa  2. Supervision of high risk pregnancy in third trimester   Preterm labor symptoms and general obstetric precautions including but not limited to vaginal bleeding, contractions, leaking of fluid and fetal movement were reviewed in detail with the patient. Please refer to After Visit Summary for other counseling recommendations.  Return in about  2 weeks (around 04/02/2018).  Future Appointments  Date Time Provider Department Center  04/03/2018  9:00 AM [facility] US 3 [facility]    Allie BossierMyra C Janiene Aarons, MD

## 2018-04-03 ENCOUNTER — Encounter (HOSPITAL_COMMUNITY): Payer: Self-pay

## 2018-04-03 ENCOUNTER — Ambulatory Visit (HOSPITAL_COMMUNITY)
Admission: RE | Admit: 2018-04-03 | Discharge: 2018-04-03 | Disposition: A | Discharge status: Home / Self Care | Payer: 59 | Source: Ambulatory Visit | Attending: Family Medicine | Admitting: Family Medicine

## 2018-04-03 ENCOUNTER — Other Ambulatory Visit (HOSPITAL_COMMUNITY): Payer: Self-pay | Admitting: *Deleted

## 2018-04-03 DIAGNOSIS — O10913 Unspecified pre-existing hypertension complicating pregnancy, third trimester: Secondary | ICD-10-CM

## 2018-04-03 DIAGNOSIS — O10919 Unspecified pre-existing hypertension complicating pregnancy, unspecified trimester: Secondary | ICD-10-CM

## 2018-04-03 DIAGNOSIS — Z362 Encounter for other antenatal screening follow-up: Secondary | ICD-10-CM

## 2018-04-03 DIAGNOSIS — O10013 Pre-existing essential hypertension complicating pregnancy, third trimester: Secondary | ICD-10-CM | POA: Insufficient documentation

## 2018-04-03 DIAGNOSIS — Z3A32 32 weeks gestation of pregnancy: Secondary | ICD-10-CM | POA: Diagnosis not present

## 2018-04-04 ENCOUNTER — Ambulatory Visit (INDEPENDENT_AMBULATORY_CARE_PROVIDER_SITE_OTHER): Payer: 59 | Admitting: Obstetrics and Gynecology

## 2018-04-04 ENCOUNTER — Encounter: Payer: Self-pay | Admitting: Obstetrics and Gynecology

## 2018-04-04 VITALS — BP 131/89 | HR 73 | Wt 163.0 lb

## 2018-04-04 DIAGNOSIS — O0993 Supervision of high risk pregnancy, unspecified, third trimester: Secondary | ICD-10-CM

## 2018-04-04 DIAGNOSIS — O10913 Unspecified pre-existing hypertension complicating pregnancy, third trimester: Secondary | ICD-10-CM | POA: Diagnosis not present

## 2018-04-04 DIAGNOSIS — O10919 Unspecified pre-existing hypertension complicating pregnancy, unspecified trimester: Secondary | ICD-10-CM

## 2018-04-04 NOTE — Progress Notes (Signed)
Prenatal Visit Note Date: 04/04/2018 Clinic: [facility]  Subjective:  Cynthia Crawford is a 27 y.o. G2P1001 at 686w3d being seen today for ongoing prenatal care.  She is currently monitored for the following issues for this high-risk pregnancy and has Migraine; Chronic hypertension during pregnancy, antepartum; Supervision of high-risk pregnancy; and Contact dermatitis due to soap on their problem list.  Patient reports no complaints.   Contractions: Not present. Vag. Bleeding: None.  Movement: Present. Denies leaking of fluid.   The following portions of the patient's history were reviewed and updated as appropriate: allergies, current medications, past family history, past medical history, past social history, past surgical history and problem list. Problem list updated.  Objective:   Vitals:   04/04/18 0838  BP: 131/89  Pulse: 73  Weight: 163 lb (73.9 kg)    Fetal Status: Fetal Heart Rate (bpm): 145   Movement: Present     General:  Alert, oriented and cooperative. Patient is in no acute distress.  Skin: Skin is warm and dry. No rash noted.   Cardiovascular: Normal heart rate noted  Respiratory: Normal respiratory effort, no problems with respiration noted  Abdomen: Soft, gravid, appropriate for gestational age. Pain/Pressure: Absent     Pelvic:  Cervical exam deferred        Extremities: Normal range of motion.  Edema: Trace  Mental Status: Normal mood and affect. Normal behavior. Normal judgment and thought content.   Urinalysis:      Assessment and Plan:  Pregnancy: G2P1001 at 4186w3d  1. Chronic hypertension during pregnancy, antepartum Routine care. NST reactive (145 baseline, +accels, no decel, mod variability, toco quiet x 2657m) today. Normal growth yesterday. Will set up for qwk BPPs at [facility] and NSTs with us. Pt only on low dose asa - US Fetal BPP W/O Non Stress; Future - US MFM FETAL BPP WO NON STRESS; Future - US MFM FETAL BPP WO NON  STRESS; Future - US MFM FETAL BPP WO NON STRESS; Future  2. Supervision of high risk pregnancy in third trimester - US MFM FETAL BPP WO NON STRESS; Future - US MFM FETAL BPP WO NON STRESS; Future  Preterm labor symptoms and general obstetric precautions including but not limited to vaginal bleeding, contractions, leaking of fluid and fetal movement were reviewed in detail with the patient. Please refer to After Visit Summary for other counseling recommendations.  Return in about 1 week (around 04/11/2018).   [place] BingPickens, Christino Mcglinchey, MD

## 2018-04-07 ENCOUNTER — Other Ambulatory Visit: Payer: 59

## 2018-04-08 ENCOUNTER — Ambulatory Visit (INDEPENDENT_AMBULATORY_CARE_PROVIDER_SITE_OTHER): Payer: 59

## 2018-04-08 DIAGNOSIS — O0990 Supervision of high risk pregnancy, unspecified, unspecified trimester: Secondary | ICD-10-CM | POA: Diagnosis not present

## 2018-04-08 NOTE — Progress Notes (Signed)
Patient presented to the office today for NST.

## 2018-04-08 NOTE — Progress Notes (Signed)
NST:  Baseline: 150 bpm, Variability: Good {> 6 bpm), Accelerations: Reactive and Decelerations: Absent  NST on paper copy

## 2018-04-10 ENCOUNTER — Other Ambulatory Visit: Payer: Self-pay | Admitting: Obstetrics and Gynecology

## 2018-04-10 ENCOUNTER — Encounter: Payer: Self-pay | Admitting: Obstetrics and Gynecology

## 2018-04-10 ENCOUNTER — Ambulatory Visit (HOSPITAL_COMMUNITY)
Admission: RE | Admit: 2018-04-10 | Discharge: 2018-04-10 | Disposition: A | Discharge status: Home / Self Care | Payer: 59 | Source: Ambulatory Visit | Attending: Obstetrics and Gynecology | Admitting: Obstetrics and Gynecology

## 2018-04-10 ENCOUNTER — Encounter (HOSPITAL_COMMUNITY): Payer: Self-pay

## 2018-04-10 DIAGNOSIS — O403XX0 Polyhydramnios, third trimester, not applicable or unspecified: Secondary | ICD-10-CM

## 2018-04-10 DIAGNOSIS — Z362 Encounter for other antenatal screening follow-up: Secondary | ICD-10-CM

## 2018-04-10 DIAGNOSIS — Z3A33 33 weeks gestation of pregnancy: Secondary | ICD-10-CM | POA: Insufficient documentation

## 2018-04-10 DIAGNOSIS — O10913 Unspecified pre-existing hypertension complicating pregnancy, third trimester: Secondary | ICD-10-CM | POA: Insufficient documentation

## 2018-04-10 DIAGNOSIS — O0993 Supervision of high risk pregnancy, unspecified, third trimester: Secondary | ICD-10-CM

## 2018-04-10 DIAGNOSIS — O10013 Pre-existing essential hypertension complicating pregnancy, third trimester: Secondary | ICD-10-CM

## 2018-04-10 DIAGNOSIS — O10919 Unspecified pre-existing hypertension complicating pregnancy, unspecified trimester: Secondary | ICD-10-CM

## 2018-04-10 DIAGNOSIS — O409XX0 Polyhydramnios, unspecified trimester, not applicable or unspecified: Secondary | ICD-10-CM | POA: Insufficient documentation

## 2018-04-11 ENCOUNTER — Other Ambulatory Visit (HOSPITAL_COMMUNITY): Payer: 59

## 2018-04-15 ENCOUNTER — Ambulatory Visit (INDEPENDENT_AMBULATORY_CARE_PROVIDER_SITE_OTHER): Payer: 59 | Admitting: Family Medicine

## 2018-04-15 VITALS — BP 120/74 | HR 74

## 2018-04-15 DIAGNOSIS — O10913 Unspecified pre-existing hypertension complicating pregnancy, third trimester: Secondary | ICD-10-CM

## 2018-04-15 DIAGNOSIS — O0993 Supervision of high risk pregnancy, unspecified, third trimester: Secondary | ICD-10-CM

## 2018-04-15 DIAGNOSIS — O10919 Unspecified pre-existing hypertension complicating pregnancy, unspecified trimester: Secondary | ICD-10-CM

## 2018-04-15 NOTE — Progress Notes (Signed)
   PRENATAL VISIT NOTE  Subjective:  Cynthia Crawford is a 27 y.o. G2P1001 at 7721w0d being seen today for ongoing prenatal care.  She is currently monitored for the following issues for this high-risk pregnancy and has Migraine; Chronic hypertension during pregnancy, antepartum; Supervision of high-risk pregnancy; Contact dermatitis due to soap; and Polyhydramnios affecting pregnancy on their problem list.  Patient reports no complaints.  Contractions: Not present.  .  Movement: Present. Denies leaking of fluid.   The following portions of the patient's history were reviewed and updated as appropriate: allergies, current medications, past family history, past medical history, past social history, past surgical history and problem list. Problem list updated.  Objective:   Vitals:   04/15/18 1006  BP: 120/74  Pulse: 74    Fetal Status: Fetal Heart Rate (bpm): NST   Movement: Present     General:  Alert, oriented and cooperative. Patient is in no acute distress.  Skin: Skin is warm and dry. No rash noted.   Cardiovascular: Normal heart rate noted  Respiratory: Normal respiratory effort, no problems with respiration noted  Abdomen: Soft, gravid, appropriate for gestational age.  Pain/Pressure: Absent     Pelvic: Cervical exam deferred        Extremities: Normal range of motion.  Edema: Trace  Mental Status: Normal mood and affect. Normal behavior. Normal judgment and thought content.  NST:  Baseline: 145 bpm, Variability: Good {> 6 bpm), Accelerations: Reactive and Decelerations: Absent   Assessment and Plan:  Pregnancy: G2P1001 at 621w0d  1. Chronic hypertension during pregnancy, antepartum On no meds, BP ok Continue ASA Appropriately grown fetus 7/19 2344 gms (82%)  2. Supervision of high risk pregnancy in third trimester   Preterm labor symptoms and general obstetric precautions including but not limited to vaginal bleeding, contractions, leaking of fluid and fetal  movement were reviewed in detail with the patient. Please refer to After Visit Summary for other counseling recommendations.  Return in 2 weeks (on 04/29/2018) for ob visit, NST weekly.  Future Appointments  Date Time Provider Department Center  04/17/2018  8:30 AM [facility] US 1 [facility]  04/21/2018  1:15 PM [facility] NURSE [facility]  04/24/2018  8:30 AM [facility] US 1 [facility]  04/28/2018  3:00 PM Anyanwu, Jethro BastosUgonna A, MD [facility]  05/01/2018  8:30 AM [facility] US 1 [facility]  05/08/2018  8:30 AM [facility] US 1 [facility]  05/15/2018  8:30 AM [facility] US 1 [facility]    Reva Boresanya S Gee Habig, MD

## 2018-04-15 NOTE — Patient Instructions (Signed)

## 2018-04-15 NOTE — Progress Notes (Signed)
nst today

## 2018-04-17 ENCOUNTER — Encounter (HOSPITAL_COMMUNITY): Payer: Self-pay

## 2018-04-17 ENCOUNTER — Other Ambulatory Visit: Payer: Self-pay | Admitting: Obstetrics and Gynecology

## 2018-04-17 ENCOUNTER — Ambulatory Visit (HOSPITAL_COMMUNITY)
Admission: RE | Admit: 2018-04-17 | Discharge: 2018-04-17 | Disposition: A | Discharge status: Home / Self Care | Payer: 59 | Source: Ambulatory Visit | Attending: Obstetrics and Gynecology | Admitting: Obstetrics and Gynecology

## 2018-04-17 DIAGNOSIS — O10913 Unspecified pre-existing hypertension complicating pregnancy, third trimester: Secondary | ICD-10-CM

## 2018-04-17 DIAGNOSIS — O10019 Pre-existing essential hypertension complicating pregnancy, unspecified trimester: Secondary | ICD-10-CM | POA: Diagnosis not present

## 2018-04-17 DIAGNOSIS — Z3A34 34 weeks gestation of pregnancy: Secondary | ICD-10-CM

## 2018-04-17 DIAGNOSIS — Z362 Encounter for other antenatal screening follow-up: Secondary | ICD-10-CM

## 2018-04-17 DIAGNOSIS — O10919 Unspecified pre-existing hypertension complicating pregnancy, unspecified trimester: Secondary | ICD-10-CM

## 2018-04-17 DIAGNOSIS — O403XX0 Polyhydramnios, third trimester, not applicable or unspecified: Secondary | ICD-10-CM | POA: Insufficient documentation

## 2018-04-18 DIAGNOSIS — Z3482 Encounter for supervision of other normal pregnancy, second trimester: Secondary | ICD-10-CM | POA: Diagnosis not present

## 2018-04-18 DIAGNOSIS — Z3483 Encounter for supervision of other normal pregnancy, third trimester: Secondary | ICD-10-CM | POA: Diagnosis not present

## 2018-04-21 ENCOUNTER — Other Ambulatory Visit: Payer: 59

## 2018-04-21 ENCOUNTER — Ambulatory Visit: Payer: 59

## 2018-04-21 VITALS — BP 145/86 | HR 72

## 2018-04-21 DIAGNOSIS — O10919 Unspecified pre-existing hypertension complicating pregnancy, unspecified trimester: Secondary | ICD-10-CM | POA: Diagnosis not present

## 2018-04-21 LAB — CBC
HEMATOCRIT: 35.2 % (ref 34.0–46.6)
Hemoglobin: 11.9 g/dL (ref 11.1–15.9)
MCH: 30.7 pg (ref 26.6–33.0)
MCHC: 33.8 g/dL (ref 31.5–35.7)
MCV: 91 fL (ref 79–97)
PLATELETS: 151 10*3/uL (ref 150–450)
RBC: 3.87 x10E6/uL (ref 3.77–5.28)
RDW: 13.8 % (ref 12.3–15.4)
WBC: 8.9 10*3/uL (ref 3.4–10.8)

## 2018-04-21 LAB — COMPREHENSIVE METABOLIC PANEL
ALK PHOS: 81 IU/L (ref 39–117)
ALT: 11 IU/L (ref 0–32)
AST: 18 IU/L (ref 0–40)
Albumin/Globulin Ratio: 1.2 (ref 1.2–2.2)
Albumin: 3.3 g/dL — ABNORMAL LOW (ref 3.5–5.5)
BUN/Creatinine Ratio: 12 (ref 9–23)
BUN: 8 mg/dL (ref 6–20)
Bilirubin Total: <0.2 mg/dL (ref 0.0–1.2)
CALCIUM: 8.7 mg/dL (ref 8.7–10.2)
CO2: 21 mmol/L (ref 20–29)
CREATININE: 0.65 mg/dL (ref 0.57–1.00)
Chloride: 105 mmol/L (ref 96–106)
GFR calc Af Amer: 141 mL/min/{1.73_m2} (ref >59)
GFR, EST NON AFRICAN AMERICAN: 122 mL/min/{1.73_m2} (ref >59)
GLOBULIN, TOTAL: 2.7 g/dL (ref 1.5–4.5)
GLUCOSE: 80 mg/dL (ref 65–99)
Potassium: 4.1 mmol/L (ref 3.5–5.2)
Sodium: 141 mmol/L (ref 134–144)
Total Protein: 6 g/dL (ref 6.0–8.5)

## 2018-04-22 LAB — PROTEIN / CREATININE RATIO, URINE
CREATININE, UR: 128.4 mg/dL
PROTEIN UR: 15.4 mg/dL
PROTEIN/CREAT RATIO: 120 mg/g{creat} (ref 0–200)

## 2018-04-24 ENCOUNTER — Ambulatory Visit (HOSPITAL_COMMUNITY)
Admission: RE | Admit: 2018-04-24 | Discharge: 2018-04-24 | Disposition: A | Discharge status: Home / Self Care | Payer: 59 | Source: Ambulatory Visit | Attending: Obstetrics and Gynecology | Admitting: Obstetrics and Gynecology

## 2018-04-24 ENCOUNTER — Encounter (HOSPITAL_COMMUNITY): Payer: Self-pay

## 2018-04-24 ENCOUNTER — Other Ambulatory Visit: Payer: Self-pay | Admitting: Obstetrics and Gynecology

## 2018-04-24 DIAGNOSIS — O10919 Unspecified pre-existing hypertension complicating pregnancy, unspecified trimester: Secondary | ICD-10-CM

## 2018-04-24 DIAGNOSIS — Z3A35 35 weeks gestation of pregnancy: Secondary | ICD-10-CM

## 2018-04-24 DIAGNOSIS — O10013 Pre-existing essential hypertension complicating pregnancy, third trimester: Secondary | ICD-10-CM

## 2018-04-24 DIAGNOSIS — O403XX0 Polyhydramnios, third trimester, not applicable or unspecified: Secondary | ICD-10-CM | POA: Insufficient documentation

## 2018-04-24 DIAGNOSIS — O0993 Supervision of high risk pregnancy, unspecified, third trimester: Secondary | ICD-10-CM

## 2018-04-28 ENCOUNTER — Ambulatory Visit (INDEPENDENT_AMBULATORY_CARE_PROVIDER_SITE_OTHER): Payer: 59 | Admitting: Obstetrics & Gynecology

## 2018-04-28 ENCOUNTER — Encounter: Payer: Self-pay | Admitting: Obstetrics & Gynecology

## 2018-04-28 VITALS — BP 144/87 | HR 80 | Wt 173.2 lb

## 2018-04-28 DIAGNOSIS — O10919 Unspecified pre-existing hypertension complicating pregnancy, unspecified trimester: Secondary | ICD-10-CM

## 2018-04-28 DIAGNOSIS — O0990 Supervision of high risk pregnancy, unspecified, unspecified trimester: Secondary | ICD-10-CM

## 2018-04-28 DIAGNOSIS — O409XX0 Polyhydramnios, unspecified trimester, not applicable or unspecified: Secondary | ICD-10-CM

## 2018-04-28 DIAGNOSIS — Z113 Encounter for screening for infections with a predominantly sexual mode of transmission: Secondary | ICD-10-CM | POA: Diagnosis not present

## 2018-04-28 LAB — CBC
HEMATOCRIT: 34.4 % (ref 34.0–46.6)
Hemoglobin: 11.4 g/dL (ref 11.1–15.9)
MCH: 30.2 pg (ref 26.6–33.0)
MCHC: 33.1 g/dL (ref 31.5–35.7)
MCV: 91 fL (ref 79–97)
Platelets: 149 10*3/uL — ABNORMAL LOW (ref 150–450)
RBC: 3.78 x10E6/uL (ref 3.77–5.28)
RDW: 13.9 % (ref 12.3–15.4)
WBC: 8.1 10*3/uL (ref 3.4–10.8)

## 2018-04-28 LAB — COMPREHENSIVE METABOLIC PANEL
ALK PHOS: 74 IU/L (ref 39–117)
ALT: 14 IU/L (ref 0–32)
AST: 18 IU/L (ref 0–40)
Albumin/Globulin Ratio: 1.3 (ref 1.2–2.2)
Albumin: 3.3 g/dL — ABNORMAL LOW (ref 3.5–5.5)
BUN / CREAT RATIO: 11 (ref 9–23)
BUN: 7 mg/dL (ref 6–20)
CO2: 20 mmol/L (ref 20–29)
Calcium: 8.4 mg/dL — ABNORMAL LOW (ref 8.7–10.2)
Chloride: 104 mmol/L (ref 96–106)
Creatinine, Ser: 0.65 mg/dL (ref 0.57–1.00)
GFR calc Af Amer: 141 mL/min/{1.73_m2} (ref >59)
GFR calc non Af Amer: 122 mL/min/{1.73_m2} (ref >59)
GLOBULIN, TOTAL: 2.5 g/dL (ref 1.5–4.5)
Glucose: 112 mg/dL — ABNORMAL HIGH (ref 65–99)
POTASSIUM: 3.6 mmol/L (ref 3.5–5.2)
SODIUM: 137 mmol/L (ref 134–144)
Total Protein: 5.8 g/dL — ABNORMAL LOW (ref 6.0–8.5)

## 2018-04-28 NOTE — Patient Instructions (Signed)
Return to clinic for any scheduled appointments or obstetric concerns, or go to [facility] for evaluation   Hypertension During Pregnancy Hypertension, commonly called high blood pressure, is when the force of blood pumping through your arteries is too strong. Arteries are blood vessels that carry blood from the heart throughout the body. Hypertension during pregnancy can cause problems for you and your baby. Your baby may be born early (prematurely) or may not weigh as much as he or she should at birth. Very bad cases of hypertension during pregnancy can be life-threatening. Different types of hypertension can occur during pregnancy. These include:  Chronic hypertension. This happens when: ? You have hypertension before pregnancy and it continues during pregnancy. ? You develop hypertension before you are [redacted] weeks pregnant, and it continues during pregnancy.  Gestational hypertension. This is hypertension that develops after the 20th week of pregnancy.  Preeclampsia, also called toxemia of pregnancy. This is a very serious type of hypertension that develops only during pregnancy. It affects the whole body, and it can be very dangerous for you and your baby.  Gestational hypertension and preeclampsia usually go away within 6 weeks after your baby is born. Women who have hypertension during pregnancy have a greater chance of developing hypertension later in life or during future pregnancies. What are the causes? The exact cause of hypertension is not known. What increases the risk? There are certain factors that make it more likely for you to develop hypertension during pregnancy. These include:  Having hypertension during a previous pregnancy or prior to pregnancy.  Being overweight.  Being older than age 27.  Being pregnant for the first time or being pregnant with more than one baby.  Becoming pregnant using fertilization methods such as IVF (in vitro fertilization).  Having diabetes,  kidney problems, or systemic lupus erythematosus.  Having a family history of hypertension.  What are the signs or symptoms? Chronic hypertension and gestational hypertension rarely cause symptoms. Preeclampsia causes symptoms, which may include:  Increased protein in your urine. Your health care provider will check for this at every visit before you give birth (prenatal visit).  Severe headaches.  Sudden weight gain.  Swelling of the hands, face, legs, and feet.  Nausea and vomiting.  Vision problems, such as blurred or double vision.  Numbness in the face, arms, legs, and feet.  Dizziness.  Slurred speech.  Sensitivity to bright lights.  Abdominal pain.  Convulsions.  How is this diagnosed? You may be diagnosed with hypertension during a routine prenatal exam. At each prenatal visit, you may:  Have a urine test to check for high amounts of protein in your urine.  Have your blood pressure checked. A blood pressure reading is recorded as two numbers, such as "120 over 80" (or 120/80). The first ("top") number is called the systolic pressure. It is a measure of the pressure in your arteries when your heart beats. The second ("bottom") number is called the diastolic pressure. It is a measure of the pressure in your arteries as your heart relaxes between beats. Blood pressure is measured in a unit called mm Hg. A normal blood pressure reading is: ? Systolic: below 120. ? Diastolic: below 80.  The type of hypertension that you are diagnosed with depends on your test results and when your symptoms developed.  Chronic hypertension is usually diagnosed before 20 weeks of pregnancy.  Gestational hypertension is usually diagnosed after 20 weeks of pregnancy.  Hypertension with high amounts of protein in the urine  is diagnosed as preeclampsia.  Blood pressure measurements that stay above 160 systolic, or above 110 diastolic, are signs of severe preeclampsia.  How is this  treated? Treatment for hypertension during pregnancy varies depending on the type of hypertension you have and how serious it is.  If you take medicines called ACE inhibitors to treat chronic hypertension, you may need to switch medicines. ACE inhibitors should not be taken during pregnancy.  If you have gestational hypertension, you may need to take blood pressure medicine.  If you are at risk for preeclampsia, your health care provider may recommend that you take a low-dose aspirin every day to prevent high blood pressure during your pregnancy.  If you have severe preeclampsia, you may need to be hospitalized so you and your baby can be monitored closely. You may also need to take medicine (magnesium sulfate) to prevent seizures and to lower blood pressure. This medicine may be given as an injection or through an IV tube.  In some cases, if your condition gets worse, you may need to deliver your baby early.  Follow these instructions at home: Eating and drinking  Drink enough fluid to keep your urine clear or pale yellow.  Eat a healthy diet that is low in salt (sodium). Do not add salt to your food. Check food labels to see how much sodium a food or beverage contains. Lifestyle  Do not use any products that contain nicotine or tobacco, such as cigarettes and e-cigarettes. If you need help quitting, ask your health care provider.  Do not use alcohol.  Avoid caffeine.  Avoid stress as much as possible. Rest and get plenty of sleep. General instructions  Take over-the-counter and prescription medicines only as told by your health care provider.  While lying down, lie on your left side. This keeps pressure off your baby.  While sitting or lying down, raise (elevate) your feet. Try putting some pillows under your lower legs.  Exercise regularly. Ask your health care provider what kinds of exercise are best for you.  Keep all prenatal and follow-up visits as told by your health  care provider. This is important. Contact a health care provider if:  You have symptoms that your health care provider told you may require more treatment or monitoring, such as: ? Fever. ? Vomiting. ? Headache. Get help right away if:  You have severe abdominal pain or vomiting that does not get better with treatment.  You suddenly develop swelling in your hands, ankles, or face.  You gain 4 lbs (1.8 kg) or more in 1 week.  You develop vaginal bleeding, or you have blood in your urine.  You do not feel your baby moving as much as usual.  You have blurred or double vision.  You have muscle twitching or sudden tightening (spasms).  You have shortness of breath.  Your lips or fingernails turn blue. This information is not intended to replace advice given to you by your health care provider. Make sure you discuss any questions you have with your health care provider. Document Released: 05/22/2011 Document Revised: 03/23/2016 Document Reviewed: 02/17/2016 Elsevier Interactive Patient Education  [employer]2018 [employer]

## 2018-04-28 NOTE — Progress Notes (Signed)
PRENATAL VISIT NOTE  Subjective:  Cynthia Crawford is a 27 y.o. G2P1001 at [redacted]w[redacted]d being seen today for ongoing prenatal care.  She is currently monitored for the following issues for this high-risk pregnancy and has Migraine; Chronic hypertension during pregnancy, antepartum; Supervision of high-risk pregnancy; Contact dermatitis due to soap; and Polyhydramnios affecting pregnancy on their problem list.  Patient reports no complaints Denies headaches, visual changes, RUQ/epigastric pain.  Contractions: Not present. Vag. Bleeding: None.  Movement: Present. Denies leaking of fluid.   The following portions of the patient's history were reviewed and updated as appropriate: allergies, current medications, past family history, past medical history, past social history, past surgical history and problem list. Problem list updated.  Objective:   Vitals:   04/28/18 1504 04/28/18 1505  BP: (!) 145/90 (!) 144/87  Pulse: 91 80  Weight: 173 lb 3.2 oz (78.6 kg)     Fetal Status: Fetal Heart Rate (bpm): RNST Fundal Height: 36 cm Movement: Present  Presentation: Vertex  General:  Alert, oriented and cooperative. Patient is in no acute distress.  Skin: Skin is warm and dry. No rash noted.   Cardiovascular: Normal heart rate noted  Respiratory: Normal respiratory effort, no problems with respiration noted  Abdomen: Soft, gravid, appropriate for gestational age.  Pain/Pressure: Present     Pelvic: Cervical exam performed Dilation: 1.5 Effacement (%): Thick Station: Ballotable  Extremities: Normal range of motion.     Mental Status: Normal mood and affect. Normal behavior. Normal judgment and thought content.   [place] Mfm Fetal Bpp Wo Non Stress  Result Date: 04/24/2018 ----------------------------------------------------------------------  OBSTETRICS REPORT                      (Signed Final 04/24/2018 10:35 am) ---------------------------------------------------------------------- Patient Info   ID #:       [id number]                          D.O.B.:  December 28, 1990 (27 yrs)  Name:       Cynthia Crawford                   Visit Date: 04/24/2018 08:55 am              [place] ---------------------------------------------------------------------- Performed By  Performed By:     Lenise Arena        Ref. Address:     [address]                    RDMS                                                             Rd  Attending:        Noralee Space MD        Location:         [facility]  Referred By:      Federico Flake MD ---------------------------------------------------------------------- Orders   #  Description  Code   1  [place] MFM FETAL BPP WO NON STRESS              76819.01  ----------------------------------------------------------------------   #  Ordered By               Order #        Accession #    Episode #   1  Bajandas Bing          [id number]      [id number]     [id number]  ---------------------------------------------------------------------- Indications   [redacted] weeks gestation of pregnancy                Z3A.35   Hypertension - Chronic/Pre-existing (ASA)      O10.019   Polyhydramnios, third trimester, antepartum    O40.3XX0   condition or complication, unspecified fetus  ---------------------------------------------------------------------- OB History  Blood Type:            Height:  5'4"   Weight (lb):  140       BMI:  24.03  Gravidity:    2         Term:   1        Prem:   0        SAB:   0  TOP:          0       Ectopic:  0        Living: 1 ---------------------------------------------------------------------- Fetal Evaluation  Num Of Fetuses:     1  Fetal Heart         144  Rate(bpm):  Cardiac Activity:   Observed  Presentation:       Cephalic  Placenta:           Posterior  P. Cord Insertion:  Previously Visualized  Amniotic Fluid  AFI FV:      Within normal limits  AFI Sum(cm)     %Tile       Largest Pocket(cm)  22.98           87           8.55  RUQ(cm)       RLQ(cm)       LUQ(cm)        LLQ(cm)  7.91          1.63          4.89           8.55 ---------------------------------------------------------------------- Biophysical Evaluation  Amniotic F.V:   Increased                  F. Tone:        Observed  F. Movement:    Observed                   Score:          8/8  F. Breathing:   Observed ---------------------------------------------------------------------- Gestational Age  LMP:           35w 2d        Date:  08/20/17                 EDD:   05/27/18  Best:          Consuello Closs 2d     Det. By:  LMP  (08/20/17)          EDD:   05/27/18 ---------------------------------------------------------------------- Anatomy  Thoracic:  Appears normal         Stomach:                Appears normal, left                                                                        sided  Heart:                 Appears normal         Abdomen:                Appears normal                         (4CH, axis, and situs  LVOT:                  Appears normal         Kidneys:                Appear normal  Diaphragm:             Appears normal         Bladder:                Appears normal ---------------------------------------------------------------------- Cervix Uterus Adnexa  Cervix  Not visualized (advanced GA >24wks) ---------------------------------------------------------------------- Impression  Chronic hypertension. Well-controlled without  antihypertensives.  Amniotic fluid is normal and good fetal activity is seen.  Antenatal testing is reassuring. BPP 8/8.  We reassured the patient of the findings. ---------------------------------------------------------------------- Recommendations  Continue weekly antenatal testing till delivery. ----------------------------------------------------------------------                  Noralee Space, MD Electronically Signed Final Report   04/24/2018 10:35 am  ----------------------------------------------------------------------  [place] Mfm Fetal Bpp Wo Non Stress  Result Date: 04/17/2018 ----------------------------------------------------------------------  OBSTETRICS REPORT                      (Signed Final 04/17/2018 09:40 am) ---------------------------------------------------------------------- Patient Info  ID #:       [id number]                          D.O.B.:  1991/05/27 (27 yrs)  Name:       Cynthia Crawford                   Visit Date: 04/17/2018 08:52 am              [place] ---------------------------------------------------------------------- Performed By  Performed By:     Eden Lathe BS      Ref. Address:     [address]                    RDMS RVT                                                             Rd  Attending:  Noralee Space MD        Location:         [facility]  Referred By:      Federico Flake MD ---------------------------------------------------------------------- Orders   #  Description                                 Code   1  [place] MFM FETAL BPP WO NON STRESS              76819.01  ----------------------------------------------------------------------   #  Ordered By               Order #        Accession #    Episode #   1  [place] Bing          [id number]      [id number]     [id number]  ---------------------------------------------------------------------- Indications   [redacted] weeks gestation of pregnancy                Z3A.34   Hypertension - Chronic/Pre-existing (ASA)      O10.019   Encounter for other antenatal screening        Z36.2   follow-up   Polyhydramnios, third trimester, antepartum    O40.3XX0   condition or complication, unspecified fetus  ---------------------------------------------------------------------- OB History  Blood Type:            Height:  5'4"   Weight (lb):  140       BMI:  24.03  Gravidity:    2         Term:   1        Prem:   0        SAB:   0  TOP:          0        Ectopic:  0        Living: 1 ---------------------------------------------------------------------- Fetal Evaluation  Num Of Fetuses:     1  Fetal Heart         144  Rate(bpm):  Cardiac Activity:   Observed  Presentation:       Cephalic  Amniotic Fluid  AFI FV:      Mild Polyhydramnios  AFI Sum(cm)     %Tile       Largest Pocket(cm)  27.06           96          9.35  RUQ(cm)       RLQ(cm)       LUQ(cm)        LLQ(cm)  6.03          9.35          4.7            6.98 ---------------------------------------------------------------------- Biophysical Evaluation  Amniotic F.V:   Polyhydramnios             F. Tone:        Observed  F. Movement:    Observed                   Score:          8/8  F. Breathing:   Observed ---------------------------------------------------------------------- Gestational Age  LMP:  34w 2d        Date:  08/20/17                 EDD:   05/27/18  Best:          34w 2d     Det. By:  LMP  (08/20/17)          EDD:   05/27/18 ---------------------------------------------------------------------- Impression  Mild polyhydramnios is seen (AFI=27 cm). Antenatal testing  is reassuring. BPP 8/8.  Chronic hypertension. Well-controlled without  antihypertensives. ---------------------------------------------------------------------- Recommendations  Continue weekly antenatal testing till delivery. ----------------------------------------------------------------------                  Noralee Spaceavi Shankar, MD Electronically Signed Final Report   04/17/2018 09:40 am ----------------------------------------------------------------------  [place] Mfm Fetal Bpp Wo Non Stress  Result Date: 04/10/2018 ----------------------------------------------------------------------  OBSTETRICS REPORT                      (Signed Final 04/10/2018 12:54 pm) ---------------------------------------------------------------------- Patient Info  ID #:       [id number]                          D.O.B.:  Jul 14, 1991 (27 yrs)  Name:        Cynthia ReinHASITY HARGR                   Visit Date: 04/10/2018 10:25 am              [place] ---------------------------------------------------------------------- Performed By  Performed By:     Eden Lathearrie Stalter BS      [address]     [address]                    [address]                                                             [address]  Attending:        Noralee Spaceavi Shankar MD        Location[facility]  Referred By:      Federico FlakeKIMBERLY NILES                    [place] MD ---------------------------------------------------------------------- Orders   #  Description                                 Code   1  US MFM FETAL BPP WO NON STRESS              76819.01  ----------------------------------------------------------------------   #  Ordered By               Order #        Accession #    Episode #   1  [place] BingHARLIE PICKENS          [id number]      [id number]     [id number]  ---------------------------------------------------------------------- Indications   [redacted] weeks gestation of pregnancy                Z3A.33   Hypertension - Chronic/Pre-existing (ASA)  O10.019   Encounter for other antenatal screening        Z36.2   follow-up   Polyhydramnios, third trimester, antepartum    O40.3XX0   condition or complication, unspecified fetus  ---------------------------------------------------------------------- OB History  Blood Type:            Height:  5'4"   Weight (lb):  140       BMI:  24.03  Gravidity:    2         Term:   1        Prem:   0        SAB:   0  TOP:          0       Ectopic:  0        Living: 1 ---------------------------------------------------------------------- Fetal Evaluation  Num Of Fetuses:     1  Fetal Heart         151  Rate(bpm):  Cardiac Activity:   Observed  Presentation:       Cephalic  Amniotic Fluid  AFI FV:      Mild Polyhydramnios  AFI Sum(cm)     %Tile       Largest Pocket(cm)  27.89           > 97        9.1  RUQ(cm)       RLQ(cm)       LUQ(cm)        LLQ(cm)  9.1           5.18           5.05           8.56 ---------------------------------------------------------------------- Biophysical Evaluation  Amniotic F.V:   Within normal limits       F. Tone:        Observed  F. Movement:    Observed                   Score:          8/8  F. Breathing:   Observed ---------------------------------------------------------------------- Gestational Age  LMP:           33w 2d        Date:  08/20/17                 EDD:   05/27/18  Best:          33w 2d     Det. By:  LMP  (08/20/17)          EDD:   05/27/18 ---------------------------------------------------------------------- Impression  Mild polyhydramnios is seen.  BPP 8/8. ---------------------------------------------------------------------- Recommendations  Continue weekly antenatal testing till delivery. ----------------------------------------------------------------------                  Noralee Space, MD Electronically Signed Final Report   04/10/2018 12:54 pm ----------------------------------------------------------------------  [place] Mfm Ob Follow Up  Result Date: 04/03/2018 ----------------------------------------------------------------------  OBSTETRICS REPORT                      (Signed Final 04/03/2018 11:06 am) ---------------------------------------------------------------------- Patient Info  ID #:       [id number]                          D.O.B.:  14-Aug-1991 (27 yrs)  Name:       Cynthia Crawford  Visit Date: 04/03/2018 09:14 am              [place] ---------------------------------------------------------------------- Performed By  Performed By:     Hurman Horn          Ref. Address:     [address]                    RDMS                                                             Rd  Attending:        Noralee Space MD        Location:         [facility]  Referred By:      Federico Flake MD ---------------------------------------------------------------------- Orders   #  Description                                  Code   1  [place] MFM OB FOLLOW UP                         [phone]  ----------------------------------------------------------------------   #  Ordered By               Order #        Accession #    Episode #   1  Particia Nearing            [id number]      [id number]     [id number]  ---------------------------------------------------------------------- Indications   [redacted] weeks gestation of pregnancy                Z3A.32   Hypertension - Chronic/Pre-existing (ASA)      O10.019   Encounter for other antenatal screening        Z36.2   follow-up  ---------------------------------------------------------------------- OB History  Blood Type:            Height:  5'4"   Weight (lb):  140       BMI:  24.03  Gravidity:    2         Term:   1        Prem:   0        SAB:   0  TOP:          0       Ectopic:  0        Living: 1 ---------------------------------------------------------------------- Fetal Evaluation  Num Of Fetuses:     1  Fetal Heart         164  Rate(bpm):  Cardiac Activity:   Observed  Presentation:       Cephalic  Placenta:           Posterior  P. Cord Insertion:  Visualized, central  Amniotic Fluid  AFI FV:      Subjectively upper-normal  AFI Sum(cm)     %Tile       Largest Pocket(cm)  22.66           88  7.85  RUQ(cm)       RLQ(cm)       LUQ(cm)        LLQ(cm)  7.85          6.5           3.71           4.6 ---------------------------------------------------------------------- Biometry  BPD:      83.7  mm     G. Age:  33w 5d         81  %    CI:        75.44   %    70 - 86                                                          FL/HC:      19.3   %    19.1 - 21.3  HC:      305.6  mm     G. Age:  34w 0d         59  %    HC/AC:      0.96        0.96 - 1.17  AC:      318.7  mm     G. Age:  35w 5d       > 97  %    FL/BPD:     70.5   %    71 - 87  FL:         59  mm     G. Age:  30w 6d          8  %    FL/AC:      18.5   %    20 - 24  HUM:      53.2  mm     G. Age:  31w 0d         27  %   Est. FW:    2344  gm      5 lb 3 oz     82  % ---------------------------------------------------------------------- Gestational Age  LMP:           32w 2d        Date:  08/20/17                 EDD:   05/27/18  U/S Today:     33w 4d                                        EDD:   05/18/18  Best:          32w 2d     Det. By:  LMP  (08/20/17)          EDD:   05/27/18 ---------------------------------------------------------------------- Anatomy  Cranium:               Appears normal         Aortic Arch:            Previously seen  Cavum:                 Appears normal         Ductal Arch:  Previously seen  Ventricles:            Appears normal         Diaphragm:              Appears normal  Choroid Plexus:        Previously seen        Stomach:                Appears normal, left                                                                        sided  Cerebellum:            Previously seen        Abdomen:                Appears normal  Posterior Fossa:       Previously seen        Abdominal Wall:         Previously seen  Nuchal Fold:           Previously seen        Cord Vessels:           Previously seen  Face:                  Orbits and profile     Kidneys:                Appear normal                         previously seen  Lips:                  Appears normal         Bladder:                Appears normal  Thoracic:              Appears normal         Spine:                  Previously seen  Heart:                 Appears normal         Upper Extremities:      Previously seen                         (4CH, axis, and situs  RVOT:                  Appears normal         Lower Extremities:      Previously seen  LVOT:                  Appears normal  Other:  Female gender. Heels and  RT 5th digit prev visualized. ---------------------------------------------------------------------- Cervix Uterus Adnexa  Cervix  Not visualized (advanced GA >29wks)  Uterus  No abnormality visualized.  Left Ovary  Not  visualized.  Right Ovary  Not visualized.  Adnexa:       No abnormality visualized. ---------------------------------------------------------------------- Impression  Amniotic fluid is normal and good fetal activity is seen. Fetal  growth is appropriate for gestational age. Abdominal  circumference measures at greater than the 95th percentile.  Chronic hypertension. She is not taking antihypertensives.  BP at our office: 134/88 mm Hg. ---------------------------------------------------------------------- Recommendations  -An appointment was made for her to return in 4 weeks for  fetal growth assessment.  -Weekly BPP from next visit. ----------------------------------------------------------------------                  Noralee Space, MD Electronically Signed Final Report   04/03/2018 11:06 am ----------------------------------------------------------------------   Assessment and Plan:  Pregnancy: G2P1001 at [redacted]w[redacted]d  1. Chronic hypertension during pregnancy, antepartum Elevated BP two weeks in a row, no symptoms. Will check labs to rule out superimposed preeclampsia; preeclampsia precautions reviewed.  NST performed today was reviewed and was found to be reactive.  BPP to be done later in the week .Continue recommended antenatal testing and prenatal care. If BP remains in 140/90s,  no symptoms/signs of superimposed preeclampsia, and reassuring antenatal testing, patient will undergo IOL at 39 weeks.   - CBC - Comprehensive metabolic panel - Protein / creatinine ratio, urine  2. Polyhydramnios affecting pregnancy Two weeks ago, had mild polyhydramnios; but normal last week. Will continue to monitor.  3. Supervision of high risk pregnancy, antepartum Pelvic cultures done today - Culture, beta strep (group b only) - GC/Chlamydia probe amp ([place])not at [facility] Preterm labor symptoms and general obstetric precautions including but not limited to vaginal bleeding, contractions, leaking of fluid and  fetal movement were reviewed in detail with the patient. Please refer to After Visit Summary for other counseling recommendations.  Return in about 1 week (around 05/05/2018) for OB Visit, NST (needs weekly visits for this).  Future Appointments  Date Time Provider Department Center  05/01/2018  8:30 AM WH-MFC [place] 1 WH-MFCUS MFC-US  05/08/2018  8:30 AM WH-MFC [place] 1 WH-MFCUS MFC-US  05/15/2018  8:30 AM WH-MFC [place] 1 WH-MFCUS MFC-US    Jaynie Collins, MD

## 2018-04-29 ENCOUNTER — Telehealth: Payer: Self-pay | Admitting: Radiology

## 2018-04-29 LAB — PROTEIN / CREATININE RATIO, URINE
Creatinine, Urine: 160 mg/dL
PROTEIN UR: 18.1 mg/dL
PROTEIN/CREAT RATIO: 113 mg/g{creat} (ref 0–200)

## 2018-04-29 NOTE — Telephone Encounter (Signed)
Left a voicemail to call [email or web address] to schedule appointment for follow-up for next week.

## 2018-04-30 LAB — GC/CHLAMYDIA PROBE AMP (~~LOC~~) NOT AT ARMC
Chlamydia: NEGATIVE
Neisseria Gonorrhea: NEGATIVE

## 2018-05-01 ENCOUNTER — Encounter (HOSPITAL_COMMUNITY): Payer: Self-pay

## 2018-05-01 ENCOUNTER — Ambulatory Visit (HOSPITAL_COMMUNITY)
Admission: RE | Admit: 2018-05-01 | Discharge: 2018-05-01 | Disposition: A | Discharge status: Home / Self Care | Payer: 59 | Source: Ambulatory Visit | Attending: Family Medicine | Admitting: Family Medicine

## 2018-05-01 DIAGNOSIS — Z362 Encounter for other antenatal screening follow-up: Secondary | ICD-10-CM | POA: Diagnosis present

## 2018-05-01 DIAGNOSIS — O10013 Pre-existing essential hypertension complicating pregnancy, third trimester: Secondary | ICD-10-CM | POA: Insufficient documentation

## 2018-05-01 DIAGNOSIS — Z3A36 36 weeks gestation of pregnancy: Secondary | ICD-10-CM | POA: Insufficient documentation

## 2018-05-01 DIAGNOSIS — O10919 Unspecified pre-existing hypertension complicating pregnancy, unspecified trimester: Secondary | ICD-10-CM

## 2018-05-01 DIAGNOSIS — O0993 Supervision of high risk pregnancy, unspecified, third trimester: Secondary | ICD-10-CM

## 2018-05-01 DIAGNOSIS — O403XX0 Polyhydramnios, third trimester, not applicable or unspecified: Secondary | ICD-10-CM | POA: Diagnosis not present

## 2018-05-01 DIAGNOSIS — O10913 Unspecified pre-existing hypertension complicating pregnancy, third trimester: Secondary | ICD-10-CM | POA: Diagnosis not present

## 2018-05-01 LAB — CULTURE, BETA STREP (GROUP B ONLY): Strep Gp B Culture: NEGATIVE

## 2018-05-05 ENCOUNTER — Inpatient Hospital Stay (HOSPITAL_COMMUNITY)
Admission: AD | Admit: 2018-05-05 | Discharge: 2018-05-05 | Disposition: A | Discharge status: Home / Self Care | Payer: 59 | Source: Ambulatory Visit | Attending: Obstetrics and Gynecology | Admitting: Obstetrics and Gynecology

## 2018-05-05 ENCOUNTER — Other Ambulatory Visit: Payer: Self-pay

## 2018-05-05 ENCOUNTER — Ambulatory Visit (INDEPENDENT_AMBULATORY_CARE_PROVIDER_SITE_OTHER): Payer: 59 | Admitting: Family Medicine

## 2018-05-05 ENCOUNTER — Encounter (HOSPITAL_COMMUNITY): Payer: Self-pay

## 2018-05-05 VITALS — BP 139/95 | HR 72 | Wt 163.2 lb

## 2018-05-05 DIAGNOSIS — Z7982 Long term (current) use of aspirin: Secondary | ICD-10-CM | POA: Insufficient documentation

## 2018-05-05 DIAGNOSIS — O409XX0 Polyhydramnios, unspecified trimester, not applicable or unspecified: Secondary | ICD-10-CM

## 2018-05-05 DIAGNOSIS — O10913 Unspecified pre-existing hypertension complicating pregnancy, third trimester: Secondary | ICD-10-CM

## 2018-05-05 DIAGNOSIS — O0993 Supervision of high risk pregnancy, unspecified, third trimester: Secondary | ICD-10-CM

## 2018-05-05 DIAGNOSIS — Z880 Allergy status to penicillin: Secondary | ICD-10-CM | POA: Diagnosis not present

## 2018-05-05 DIAGNOSIS — O10013 Pre-existing essential hypertension complicating pregnancy, third trimester: Secondary | ICD-10-CM | POA: Insufficient documentation

## 2018-05-05 DIAGNOSIS — Z3A36 36 weeks gestation of pregnancy: Secondary | ICD-10-CM | POA: Diagnosis not present

## 2018-05-05 DIAGNOSIS — R51 Headache: Secondary | ICD-10-CM | POA: Diagnosis present

## 2018-05-05 DIAGNOSIS — O10919 Unspecified pre-existing hypertension complicating pregnancy, unspecified trimester: Secondary | ICD-10-CM

## 2018-05-05 LAB — CBC
HEMATOCRIT: 37.1 % (ref 36.0–46.0)
HEMOGLOBIN: 12.7 g/dL (ref 12.0–15.0)
MCH: 31.4 pg (ref 26.0–34.0)
MCHC: 34.2 g/dL (ref 30.0–36.0)
MCV: 91.6 fL (ref 78.0–100.0)
Platelets: 148 10*3/uL — ABNORMAL LOW (ref 150–400)
RBC: 4.05 MIL/uL (ref 3.87–5.11)
RDW: 13.3 % (ref 11.5–15.5)
WBC: 10.8 10*3/uL — AB (ref 4.0–10.5)

## 2018-05-05 LAB — COMPREHENSIVE METABOLIC PANEL
ALBUMIN: 3.2 g/dL — AB (ref 3.5–5.0)
ALK PHOS: 83 U/L (ref 38–126)
ALT: 17 U/L (ref 0–44)
ANION GAP: 9 (ref 5–15)
AST: 19 U/L (ref 15–41)
BUN: 7 mg/dL (ref 6–20)
CALCIUM: 8.8 mg/dL — AB (ref 8.9–10.3)
CO2: 21 mmol/L — ABNORMAL LOW (ref 22–32)
Chloride: 103 mmol/L (ref 98–111)
Creatinine, Ser: 0.55 mg/dL (ref 0.44–1.00)
GFR calc Af Amer: >60 mL/min (ref >60)
GFR calc non Af Amer: >60 mL/min (ref >60)
GLUCOSE: 90 mg/dL (ref 70–99)
Potassium: 3.7 mmol/L (ref 3.5–5.1)
SODIUM: 133 mmol/L — AB (ref 135–145)
Total Bilirubin: 0.4 mg/dL (ref 0.3–1.2)
Total Protein: 6.9 g/dL (ref 6.5–8.1)

## 2018-05-05 LAB — PROTEIN / CREATININE RATIO, URINE
Creatinine, Urine: 102 mg/dL
PROTEIN CREATININE RATIO: 0.13 mg/mg{creat} (ref 0.00–0.15)
Total Protein, Urine: 13 mg/dL

## 2018-05-05 MED ORDER — ACETAMINOPHEN 325 MG PO TABS
650.0000 mg | ORAL_TABLET | Freq: Four times a day (QID) | ORAL | Status: DC | PRN
  Administered 2018-05-05: 650 mg via ORAL
  Filled 2018-05-05: qty 2

## 2018-05-05 NOTE — Discharge Instructions (Signed)

## 2018-05-05 NOTE — Patient Instructions (Signed)
Preeclampsia and Eclampsia °Preeclampsia is a serious condition that develops only during pregnancy. It is also called toxemia of pregnancy. This condition causes high blood pressure along with other symptoms, such as swelling and headaches. These symptoms may develop as the condition gets worse. Preeclampsia may occur at 20 weeks of pregnancy or later. °Diagnosing and treating preeclampsia early is very important. If not treated early, it can cause serious problems for you and your baby. One problem it can lead to is eclampsia, which is a condition that causes muscle jerking or shaking (convulsions or seizures) in the mother. Delivering your baby is the best treatment for preeclampsia or eclampsia. Preeclampsia and eclampsia symptoms usually go away after your baby is born. °What are the causes? °The cause of preeclampsia is not known. °What increases the risk? °The following risk factors make you more likely to develop preeclampsia: °· Being pregnant for the first time. °· Having had preeclampsia during a past pregnancy. °· Having a family history of preeclampsia. °· Having high blood pressure. °· Being pregnant with twins or triplets. °· Being 35 or older. °· Being African-American. °· Having kidney disease or diabetes. °· Having medical conditions such as lupus or blood diseases. °· Being very overweight (obese). ° °What are the signs or symptoms? °The earliest signs of preeclampsia are: °· High blood pressure. °· Increased protein in your urine. Your health care provider will check for this at every visit before you give birth (prenatal visit). ° °Other symptoms that may develop as the condition gets worse include: °· Severe headaches. °· Sudden weight gain. °· Swelling of the hands, face, legs, and feet. °· Nausea and vomiting. °· Vision problems, such as blurred or double vision. °· Numbness in the face, arms, legs, and feet. °· Urinating less than usual. °· Dizziness. °· Slurred speech. °· Abdominal pain,  especially upper abdominal pain. °· Convulsions or seizures. ° °Symptoms generally go away after giving birth. °How is this diagnosed? °There are no screening tests for preeclampsia. Your health care provider will ask you about symptoms and check for signs of preeclampsia during your prenatal visits. You may also have tests that include: °· Urine tests. °· Blood tests. °· Checking your blood pressure. °· Monitoring your baby’s heart rate. °· Ultrasound. ° °How is this treated? °You and your health care provider will determine the treatment approach that is best for you. Treatment may include: °· Having more frequent prenatal exams to check for signs of preeclampsia, if you have an increased risk for preeclampsia. °· Bed rest. °· Reducing how much salt (sodium) you eat. °· Medicine to lower your blood pressure. °· Staying in the hospital, if your condition is severe. There, treatment will focus on controlling your blood pressure and the amount of fluids in your body (fluid retention). °· You may need to take medicine (magnesium sulfate) to prevent seizures. This medicine may be given as an injection or through an IV tube. °· Delivering your baby early, if your condition gets worse. You may have your labor started with medicine (induced), or you may have a cesarean delivery. ° °Follow these instructions at home: °Eating and drinking ° °· Drink enough fluid to keep your urine clear or pale yellow. °· Eat a healthy diet that is low in sodium. Do not add salt to your food. Check nutrition labels to see how much sodium a food or beverage contains. °· Avoid caffeine. °Lifestyle °· Do not use any products that contain nicotine or tobacco, such as cigarettes   and e-cigarettes. If you need help quitting, ask your health care provider. °· Do not use alcohol or drugs. °· Avoid stress as much as possible. Rest and get plenty of sleep. °General instructions °· Take over-the-counter and prescription medicines only as told by your  health care provider. °· When lying down, lie on your side. This keeps pressure off of your baby. °· When sitting or lying down, raise (elevate) your feet. Try putting some pillows underneath your lower legs. °· Exercise regularly. Ask your health care provider what kinds of exercise are best for you. °· Keep all follow-up and prenatal visits as told by your health care provider. This is important. °How is this prevented? °To prevent preeclampsia or eclampsia from developing during another pregnancy: °· Get proper medical care during pregnancy. Your health care provider may be able to prevent preeclampsia or diagnose and treat it early. °· Your health care provider may have you take a low-dose aspirin or a calcium supplement during your next pregnancy. °· You may have tests of your blood pressure and kidney function after giving birth. °· Maintain a healthy weight. Ask your health care provider for help managing weight gain during pregnancy. °· Work with your health care provider to manage any long-term (chronic) health conditions you have, such as diabetes or kidney problems. ° °Contact a health care provider if: °· You gain more weight than expected. °· You have headaches. °· You have nausea or vomiting. °· You have abdominal pain. °· You feel dizzy or light-headed. °Get help right away if: °· You develop sudden or severe swelling anywhere in your body. This usually happens in the legs. °· You gain 5 lbs (2.3 kg) or more during one week. °· You have severe: °? Abdominal pain. °? Headaches. °? Dizziness. °? Vision problems. °? Confusion. °? Nausea or vomiting. °· You have a seizure. °· You have trouble moving any part of your body. °· You develop numbness in any part of your body. °· You have trouble speaking. °· You have any abnormal bleeding. °· You pass out. °This information is not intended to replace advice given to you by your health care provider. Make sure you discuss any questions you have with your health  care provider. °Document Released: 08/31/2000 Document Revised: 05/01/2016 Document Reviewed: 04/09/2016 °Elsevier Interactive Patient Education © 2018 [employer] ° °

## 2018-05-05 NOTE — Progress Notes (Signed)
Patient blood pressure is 162/101 right arm

## 2018-05-05 NOTE — MAU Provider Note (Signed)
Chief Complaint:  Hypertension and Headache   First Provider Initiated Contact with Patient 05/05/18 1746     HPI: Cynthia Crawford is a 27 y.o. G2P1001 at 51w6dwho presents to maternity admissions reporting mild headache and newly elevated blood pressures. Has chronic hypertension, but BP in office today was higher than usual. Per Dr Shawnie Pons, do eval and give Tylenol to see if H/A goes away.. She reports good fetal movement, denies LOF, vaginal bleeding, vaginal itching/burning, urinary symptoms, dizziness, n/v, diarrhea, constipation or fever/chills.  She denies visual changes or RUQ abdominal pain.  Hypertension  This is a recurrent problem. The current episode started today. Associated symptoms include headaches. Pertinent negatives include no anxiety, blurred vision, malaise/fatigue, palpitations, peripheral edema or shortness of breath. Past treatments include nothing. There are no compliance problems.   Headache   This is a new problem. The current episode started today. The problem has been unchanged. The pain is located in the frontal region. The pain does not radiate. The quality of the pain is described as aching. The pain is mild. Pertinent negatives include no abdominal pain, back pain, blurred vision, fever, nausea, photophobia, visual change or weakness. Nothing aggravates the symptoms. She has tried nothing for the symptoms. Her past medical history is significant for hypertension.    RN Note: Sent in for elevated BP, has been up last few visits.  Slight HA, denies visual chages epigastric pain or increase in swelling. No bleeding or lekaing. Some contractions.  Past Medical History: Past Medical History:  Diagnosis Date  . Anxiety   . Asthma   . Chronic hypertension during pregnancy, antepartum 08/29/2017   [x]  Aspirin 81 mg daily after 12 weeks; discontinue after 36 weeks Current antihypertensives:  None  Was on Enalapril prior to pregnancy  Baseline and surveillance  labs (pulled in from [facility], refresh links as needed)  Lab Results Component Value Date  PLT 213 10/16/2017  CREATININE 0.61 10/16/2017  AST 17 10/16/2017  ALT 13 10/16/2017  PROTCRRATIO 0.21 (H) 01/17/2016  PROTEIN24HR 242 (H) 01/16/2016  . 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 History of gestational hypertension     Past obstetric history: OB History  Gravida Para Term Preterm AB Living  2 1 1  0 0 1  SAB TAB Ectopic Multiple Live Births  0 0 0 0 1    # Outcome Date GA Lbr Len/2nd Weight Sex Delivery Anes PTL Lv  2 Current           1 Term 01/18/16 [redacted]w[redacted]d / 03:20 2651 g F Vag-Spont EPI, Local  LIV    Past Surgical History: Past Surgical History:  Procedure Laterality Date  . TONSILLECTOMY      Family History: Family History  Problem Relation Age of Onset  . Diabetes Father   . Asthma Mother     Social History: Social History   Tobacco Use  . Smoking status: Never Smoker  . Smokeless tobacco: Never Used  Substance Use Topics  . Alcohol use: No  . Drug use: No    Allergies:  Allergies  Allergen Reactions  . Penicillins Hives    Has patient had a PCN reaction causing immediate rash, facial/tongue/throat swelling, SOB or lightheadedness with hypotension: Yes Has patient had a PCN reaction causing severe rash involving mucus membranes or skin necrosis: No Has patient had a PCN reaction that required hospitalization No Has patient had a PCN  reaction occurring within the last 10 years: No If all of the above answers are "NO", then may proceed with Cephalosporin use.  . Sulfa Antibiotics Hives    Meds:  Medications Prior to Admission  Medication Sig Dispense Refill Last Dose  . aspirin EC 81 MG tablet Take 1 tablet (81 mg total) by mouth daily. Take after 12 weeks for prevention of preeclampsia later in pregnancy 300 tablet 2 Taking  . Prenatal Vit-Fe Fumarate-FA (MULTIVITAMIN-PRENATAL) 27-0.8 MG TABS  tablet Take 1 tablet by mouth daily at 12 noon.   Taking    I have reviewed patient's Past Medical Hx, Surgical Hx, Family Hx, Social Hx, medications and allergies.   ROS:  Review of Systems  Constitutional: Negative for fever and malaise/fatigue.  Eyes: Negative for blurred vision and photophobia.  Respiratory: Negative for shortness of breath.   Cardiovascular: Negative for palpitations.  Gastrointestinal: Negative for abdominal pain and nausea.  Musculoskeletal: Negative for back pain.  Neurological: Positive for headaches. Negative for weakness.   Other systems negative  Physical Exam   Patient Vitals for the past 24 hrs:  BP Temp Temp src Pulse Resp SpO2  05/05/18 1732 (!) 143/92 98.2 F (36.8 C) Oral 73 18 100 %   Vitals:   05/05/18 1845 05/05/18 1900 05/05/18 1915 05/05/18 1930  BP: (!) 141/81 (!) 146/85 138/83 139/85  Pulse: 83 72 91 80  Resp:    18  Temp:      TempSrc:      SpO2:        Constitutional: Well-developed, well-nourished female in no acute distress.  Cardiovascular: normal rate and rhythm Respiratory: normal effort, clear to auscultation bilaterally GI: Abd soft, non-tender, gravid appropriate for gestational age.   No rebound or guarding. MS: Extremities nontender, no edema, normal ROM Neurologic: Alert and oriented x 4.  GU: Neg CVAT.  PELVIC EXAM: deferred  FHT:  Baseline 140 , moderate variability, accelerations present, no decelerations Contractions:  Irregular     Labs: Results for orders placed or performed during the hospital encounter of 05/05/18 (from the past 24 hour(s))  Protein / creatinine ratio, urine     Status: None   Collection Time: 05/05/18  5:40 PM  Result Value Ref Range   Creatinine, Urine 102.00 mg/dL   Total Protein, Urine 13 mg/dL   Protein Creatinine Ratio 0.13 0.00 - 0.15 mg/mg[Cre]  CBC     Status: Abnormal   Collection Time: 05/05/18  6:13 PM  Result Value Ref Range   WBC 10.8 (H) 4.0 - 10.5 K/uL   RBC 4.05  3.87 - 5.11 MIL/uL   Hemoglobin 12.7 12.0 - 15.0 g/dL   HCT 24.437.1 01.036.0 - 27.246.0 %   MCV 91.6 78.0 - 100.0 fL   MCH 31.4 26.0 - 34.0 pg   MCHC 34.2 30.0 - 36.0 g/dL   RDW 53.613.3 64.411.5 - 03.415.5 %   Platelets 148 (L) 150 - 400 K/uL  Comprehensive metabolic panel     Status: Abnormal   Collection Time: 05/05/18  6:13 PM  Result Value Ref Range   Sodium 133 (L) 135 - 145 mmol/L   Potassium 3.7 3.5 - 5.1 mmol/L   Chloride 103 98 - 111 mmol/L   CO2 21 (L) 22 - 32 mmol/L   Glucose, Bld 90 70 - 99 mg/dL   BUN 7 6 - 20 mg/dL   Creatinine, Ser 7.420.55 0.44 - 1.00 mg/dL   Calcium 8.8 (L) 8.9 - 10.3 mg/dL   Total Protein  6.9 6.5 - 8.1 g/dL   Albumin 3.2 (L) 3.5 - 5.0 g/dL   AST 19 15 - 41 U/L   ALT 17 0 - 44 U/L   Alkaline Phosphatase 83 38 - 126 U/L   Total Bilirubin 0.4 0.3 - 1.2 mg/dL   GFR calc non Af Amer >60 >60 mL/min   GFR calc Af Amer >60 >60 mL/min   Anion gap 9 5 - 15    O/Positive/-- (01/30 1103)  Imaging:    MAU Course/MDM: I have ordered labs and reviewed results. Labs are normal.  Protein/Creat ratio is normal  Blood pressures have improved while here NST reviewed, Reactive Consult Dr Debroah LoopArnold with presentation, exam findings and test results.  Treatments in MAU included EFM, Tylenol (which relieved headache).    Assessment: 1. Supervision of high risk pregnancy in third trimester   2.     Chronic hypertension 3.      Headache, resolved 4.     No evidence of superimposed preeclampsia  Plan: Discharge home Preeclampsia precautions  Labor precautions and fetal kick counts Follow up in Office for prenatal visits and recheck  Encouraged to return here or to other Urgent Care/ED if she develops worsening of symptoms, increase in pain, fever, or other concerning symptoms.   Pt stable at time of discharge.  Wynelle BourgeoisMarie Joplin Canty CNM, MSN Certified Nurse-Midwife 05/05/2018 5:47 PM

## 2018-05-05 NOTE — Progress Notes (Signed)
    PRENATAL VISIT NOTE  Subjective:  Cynthia Crawford is a 27 y.o. G2P1001 at 4541w6d being seen today for ongoing prenatal care.  She is currently monitored for the following issues for this high-risk pregnancy and has Migraine; Chronic hypertension during pregnancy, antepartum; Supervision of high-risk pregnancy; Contact dermatitis due to soap; and Polyhydramnios affecting pregnancy on their problem list.  Patient reports headache.  Contractions: Not present.  .  Movement: Present. Denies leaking of fluid.   The following portions of the patient's history were reviewed and updated as appropriate: allergies, current medications, past family history, past medical history, past social history, past surgical history and problem list. Problem list updated.  Objective:   Vitals:   05/05/18 1335 05/05/18 1346  BP: (!) 162/101 (!) 139/95  Pulse: 72 72  Weight: 163 lb 3.2 oz (74 kg)     Fetal Status: Fetal Heart Rate (bpm): NST    Movement: Present     General:  Alert, oriented and cooperative. Patient is in no acute distress.  Skin: Skin is warm and dry. No rash noted.   Cardiovascular: Normal heart rate noted  Respiratory: Normal respiratory effort, no problems with respiration noted  Abdomen: Soft, gravid, appropriate for gestational age.  Pain/Pressure: Present     Pelvic: Cervical exam deferred        Extremities: Normal range of motion.     Mental Status: Normal mood and affect. Normal behavior. Normal judgment and thought content.   Assessment and Plan:  Pregnancy: G2P1001 at 241w6d  1. Chronic hypertension during pregnancy, antepartum BP is up more significantly today, protein negative, has headache Will send to MAU for w/u and serial BPs and labs On ASA Last u/s shows growth at 80+% NST:  Baseline: 150 bpm, Variability: Good {> 6 bpm), Accelerations: Reactive and Decelerations: Absent - Fetal nonstress test; Future - POCT urine qual dipstick blood  2. Supervision of  high risk pregnancy in third trimester Continue prenatal care.   3. Polyhydramnios affecting pregnancy Last AFI 30 In testing  Preterm labor symptoms and general obstetric precautions including but not limited to vaginal bleeding, contractions, leaking of fluid and fetal movement were reviewed in detail with the patient. Please refer to After Visit Summary for other counseling recommendations.  No follow-ups on file.  Future Appointments  Date Time Provider Department Center  05/08/2018  8:30 AM [facility] US 1 [facility]  05/15/2018  8:30 AM [facility] US 1 [facility]    Reva Boresanya S [place], MD

## 2018-05-05 NOTE — MAU Note (Signed)
Sent in for elevated BP, has been up last few visits.  Slight HA, denies visual chages epigastric pain or increase in swelling. No bleeding or lekaing. Some contractions.

## 2018-05-08 ENCOUNTER — Encounter (HOSPITAL_COMMUNITY): Payer: Self-pay

## 2018-05-08 ENCOUNTER — Inpatient Hospital Stay (HOSPITAL_COMMUNITY)
Admission: AD | Admit: 2018-05-08 | Discharge: 2018-05-08 | Disposition: A | Discharge status: Home / Self Care | Payer: 59 | Source: Ambulatory Visit | Attending: Obstetrics and Gynecology | Admitting: Obstetrics and Gynecology

## 2018-05-08 ENCOUNTER — Encounter (HOSPITAL_COMMUNITY): Payer: Self-pay | Admitting: *Deleted

## 2018-05-08 ENCOUNTER — Other Ambulatory Visit: Payer: Self-pay

## 2018-05-08 ENCOUNTER — Ambulatory Visit (HOSPITAL_BASED_OUTPATIENT_CLINIC_OR_DEPARTMENT_OTHER)
Admission: RE | Admit: 2018-05-08 | Discharge: 2018-05-08 | Disposition: A | Discharge status: Home / Self Care | Payer: 59 | Source: Ambulatory Visit | Attending: Family Medicine | Admitting: Family Medicine

## 2018-05-08 DIAGNOSIS — O10013 Pre-existing essential hypertension complicating pregnancy, third trimester: Secondary | ICD-10-CM | POA: Diagnosis not present

## 2018-05-08 DIAGNOSIS — Z880 Allergy status to penicillin: Secondary | ICD-10-CM | POA: Diagnosis not present

## 2018-05-08 DIAGNOSIS — Z7982 Long term (current) use of aspirin: Secondary | ICD-10-CM | POA: Insufficient documentation

## 2018-05-08 DIAGNOSIS — O10913 Unspecified pre-existing hypertension complicating pregnancy, third trimester: Secondary | ICD-10-CM | POA: Insufficient documentation

## 2018-05-08 DIAGNOSIS — O403XX0 Polyhydramnios, third trimester, not applicable or unspecified: Secondary | ICD-10-CM

## 2018-05-08 DIAGNOSIS — O10919 Unspecified pre-existing hypertension complicating pregnancy, unspecified trimester: Secondary | ICD-10-CM

## 2018-05-08 DIAGNOSIS — Z3A37 37 weeks gestation of pregnancy: Secondary | ICD-10-CM | POA: Insufficient documentation

## 2018-05-08 DIAGNOSIS — R030 Elevated blood-pressure reading, without diagnosis of hypertension: Secondary | ICD-10-CM | POA: Diagnosis present

## 2018-05-08 LAB — URINALYSIS, ROUTINE W REFLEX MICROSCOPIC
BILIRUBIN URINE: NEGATIVE
GLUCOSE, UA: NEGATIVE mg/dL
Hgb urine dipstick: NEGATIVE
KETONES UR: NEGATIVE mg/dL
Nitrite: NEGATIVE
PH: 8 (ref 5.0–8.0)
Protein, ur: NEGATIVE mg/dL
Specific Gravity, Urine: 1.018 (ref 1.005–1.030)

## 2018-05-08 LAB — CBC
HCT: 35.9 % — ABNORMAL LOW (ref 36.0–46.0)
HEMOGLOBIN: 12.1 g/dL (ref 12.0–15.0)
MCH: 31.1 pg (ref 26.0–34.0)
MCHC: 33.7 g/dL (ref 30.0–36.0)
MCV: 92.3 fL (ref 78.0–100.0)
Platelets: 132 10*3/uL — ABNORMAL LOW (ref 150–400)
RBC: 3.89 MIL/uL (ref 3.87–5.11)
RDW: 13.4 % (ref 11.5–15.5)
WBC: 8.4 10*3/uL (ref 4.0–10.5)

## 2018-05-08 LAB — COMPREHENSIVE METABOLIC PANEL
ALBUMIN: 3 g/dL — AB (ref 3.5–5.0)
ALT: 16 U/L (ref 0–44)
ANION GAP: 9 (ref 5–15)
AST: 18 U/L (ref 15–41)
Alkaline Phosphatase: 79 U/L (ref 38–126)
BUN: 9 mg/dL (ref 6–20)
CHLORIDE: 104 mmol/L (ref 98–111)
CO2: 21 mmol/L — AB (ref 22–32)
Calcium: 8.3 mg/dL — ABNORMAL LOW (ref 8.9–10.3)
Creatinine, Ser: 0.6 mg/dL (ref 0.44–1.00)
GFR calc Af Amer: >60 mL/min (ref >60)
GFR calc non Af Amer: >60 mL/min (ref >60)
GLUCOSE: 81 mg/dL (ref 70–99)
POTASSIUM: 3.8 mmol/L (ref 3.5–5.1)
SODIUM: 134 mmol/L — AB (ref 135–145)
Total Bilirubin: 0.5 mg/dL (ref 0.3–1.2)
Total Protein: 6.7 g/dL (ref 6.5–8.1)

## 2018-05-08 LAB — PROTEIN / CREATININE RATIO, URINE
Creatinine, Urine: 121 mg/dL
PROTEIN CREATININE RATIO: 0.19 mg/mg{creat} — AB (ref 0.00–0.15)
Total Protein, Urine: 23 mg/dL

## 2018-05-08 MED ORDER — CYCLOBENZAPRINE HCL 5 MG PO TABS
5.0000 mg | ORAL_TABLET | Freq: Once | ORAL | Status: DC
  Filled 2018-05-08: qty 1

## 2018-05-08 MED ORDER — ACETAMINOPHEN 500 MG PO TABS
1000.0000 mg | ORAL_TABLET | Freq: Once | ORAL | Status: AC
  Administered 2018-05-08: 1000 mg via ORAL
  Filled 2018-05-08: qty 2

## 2018-05-08 NOTE — MAU Note (Signed)
Pt sent for BP evaluation.  Reports Hx of CHTN, not currently on any meds.  States has dull H/A, denies visual disturbances, epigastric pain, or ^ swelling. Reports +FM. Denies VB or LOF.

## 2018-05-08 NOTE — MAU Provider Note (Signed)
History     CSN: [id number]  Arrival date and time: 05/08/18 1003   First Provider Initiated Contact with Patient 05/08/18 1054      Chief Complaint  Patient presents with  . BP Evaluation   HPI  Cynthia Crawford is a 27 y.o. G2P1001 at [redacted]w[redacted]d who presents from MFM for evaluation of elevated BP. She has CHTN not on medications. She reports a headache that she rates a 4/10 and has not tried anything for. She denies any visual changes or epigastric pain. She was seen in MAU Monday for similar complaints and had normal labs. She denies any abdominal pain, vaginal bleeding or discharge. Reports normal fetal movement.   OB History    Gravida  2   Para  1   Term  1   Preterm  0   AB  0   Living  1     SAB  0   TAB  0   Ectopic  0   Multiple  0   Live Births  1           Past Medical History:  Diagnosis Date  . Asthma   . Chronic hypertension during pregnancy, antepartum 08/29/2017   [x]  Aspirin 81 mg daily after 12 weeks; discontinue after 36 weeks Current antihypertensives:  None  Was on Enalapril prior to pregnancy  Baseline and surveillance labs (pulled in from [facility], refresh links as needed)  Lab Results Component Value Date  PLT 213 10/16/2017  CREATININE 0.61 10/16/2017  AST 17 10/16/2017  ALT 13 10/16/2017  PROTCRRATIO 0.21 (H) 01/17/2016  PROTEIN24HR 242 (H) 01/16/2016  . History of gestational hypertension     Past Surgical History:  Procedure Laterality Date  . TONSILLECTOMY      Family History  Problem Relation Age of Onset  . Diabetes Father   . Asthma Mother     Social History   Tobacco Use  . Smoking status: Never Smoker  . Smokeless tobacco: Never Used  Substance Use Topics  . Alcohol use: No  . Drug use: No    Allergies:  Allergies  Allergen Reactions  . Penicillins Hives    Has patient had a PCN reaction causing immediate rash, facial/tongue/throat swelling, SOB or lightheadedness with hypotension: Yes Has patient had a  PCN reaction causing severe rash involving mucus membranes or skin necrosis: No Has patient had a PCN reaction that required hospitalization No Has patient had a PCN reaction occurring within the last 10 years: No If all of the above answers are "NO", then may proceed with Cephalosporin use.  . Sulfa Antibiotics Hives    Medications Prior to Admission  Medication Sig Dispense Refill Last Dose  . aspirin EC 81 MG tablet Take 1 tablet (81 mg total) by mouth daily. Take after 12 weeks for prevention of preeclampsia later in pregnancy 300 tablet 2 Taking  . Prenatal Vit-Fe Fumarate-FA (MULTIVITAMIN-PRENATAL) 27-0.8 MG TABS tablet Take 1 tablet by mouth daily at 12 noon.   Taking    Review of Systems  Constitutional: Negative.  Negative for fatigue and fever.  HENT: Negative.   Eyes: Negative for visual disturbance.  Respiratory: Negative.  Negative for shortness of breath.   Cardiovascular: Negative.  Negative for chest pain.  Gastrointestinal: Negative.  Negative for abdominal pain, constipation, diarrhea, nausea and vomiting.  Genitourinary: Negative.  Negative for dysuria, vaginal bleeding and vaginal discharge.  Neurological: Positive for headaches. Negative for dizziness.   Physical Exam   Blood pressure Marland Kitchen)  144/99, pulse 89, temperature 98.8 F (37.1 C), temperature source Oral, resp. rate 18, height 5\' 4"  (1.626 m), weight 78.6 kg, last menstrual period 08/20/2017, SpO2 100 %.  Patient Vitals for the past 24 hrs:  BP Temp Temp src Pulse Resp SpO2 Height Weight  05/08/18 1331 138/86 - - 79 - - - -  05/08/18 1316 (!) 144/70 - - 67 - - - -  05/08/18 1315 - - - - - 100 % - -  05/08/18 1250 - - - - - 99 % - -  05/08/18 1246 (!) 159/105 - - 95 - - - -  05/08/18 1235 - - - - - 100 % - -  05/08/18 1231 (!) 142/88 - - 76 - - - -  05/08/18 1216 (!) 144/83 - - 72 - - - -  05/08/18 1201 136/90 - - 83 - - - -  05/08/18 1200 - - - - - 100 % - -  05/08/18 1146 (!) 151/85 - - 84 - - - -   05/08/18 1145 - - - - - 99 % - -  05/08/18 1131 (!) 140/95 - - 86 - - - -  05/08/18 1115 (!) 148/96 - - 85 - 100 % - -  05/08/18 1101 (!) 148/100 - - 87 - - - -  05/08/18 1046 (!) 144/99 - - 89 - - - -  05/08/18 1040 - - - - - 100 % - -  05/08/18 1031 (!) 148/90 - - 82 - - - -  05/08/18 1027 (!) 146/98 98.8 F (37.1 C) Oral 77 18 100 % - -  05/08/18 1018 - - - - - - 5\' 4"  (1.626 m) 78.6 kg   Physical Exam  Nursing note and vitals reviewed. Constitutional: She is oriented to person, place, and time. She appears well-developed and well-nourished. No distress.  HENT:  Head: Normocephalic.  Eyes: Pupils are equal, round, and reactive to light.  Cardiovascular: Normal rate, regular rhythm and normal heart sounds.  Respiratory: Effort normal and breath sounds normal. No respiratory distress.  GI: Soft. Bowel sounds are normal. She exhibits no distension. There is no tenderness.  Neurological: She is alert and oriented to person, place, and time.  Skin: Skin is warm and dry.  Psychiatric: She has a normal mood and affect. Her behavior is normal. Judgment and thought content normal.   Fetal Tracing:  Baseline: 140 Variability: moderate Accels: 15x15 Decels: none  Toco: none   MAU Course  Procedures Results for orders placed or performed during the hospital encounter of 05/08/18 (from the past 24 hour(s))  Protein / creatinine ratio, urine     Status: Abnormal   Collection Time: 05/08/18 10:16 AM  Result Value Ref Range   Creatinine, Urine 121.00 mg/dL   Total Protein, Urine 23 mg/dL   Protein Creatinine Ratio 0.19 (H) 0.00 - 0.15 mg/mg[Cre]  Urinalysis, Routine w reflex microscopic     Status: Abnormal   Collection Time: 05/08/18 10:21 AM  Result Value Ref Range   Color, Urine YELLOW YELLOW   APPearance HAZY (A) CLEAR   Specific Gravity, Urine 1.018 1.005 - 1.030   pH 8.0 5.0 - 8.0   Glucose, UA NEGATIVE NEGATIVE mg/dL   Hgb urine dipstick NEGATIVE NEGATIVE   Bilirubin  Urine NEGATIVE NEGATIVE   Ketones, ur NEGATIVE NEGATIVE mg/dL   Protein, ur NEGATIVE NEGATIVE mg/dL   Nitrite NEGATIVE NEGATIVE   Leukocytes, UA TRACE (A) NEGATIVE   RBC / HPF 0-5  0 - 5 RBC/hpf   WBC, UA 0-5 0 - 5 WBC/hpf   Bacteria, UA MANY (A) NONE SEEN   Squamous Epithelial / LPF 6-10 0 - 5   Mucus PRESENT   CBC     Status: Abnormal   Collection Time: 05/08/18 10:47 AM  Result Value Ref Range   WBC 8.4 4.0 - 10.5 K/uL   RBC 3.89 3.87 - 5.11 MIL/uL   Hemoglobin 12.1 12.0 - 15.0 g/dL   HCT 16.1 (L) 09.6 - 04.5 %   MCV 92.3 78.0 - 100.0 fL   MCH 31.1 26.0 - 34.0 pg   MCHC 33.7 30.0 - 36.0 g/dL   RDW 40.9 81.1 - 91.4 %   Platelets 132 (L) 150 - 400 K/uL  Comprehensive metabolic panel     Status: Abnormal   Collection Time: 05/08/18 10:47 AM  Result Value Ref Range   Sodium 134 (L) 135 - 145 mmol/L   Potassium 3.8 3.5 - 5.1 mmol/L   Chloride 104 98 - 111 mmol/L   CO2 21 (L) 22 - 32 mmol/L   Glucose, Bld 81 70 - 99 mg/dL   BUN 9 6 - 20 mg/dL   Creatinine, Ser 7.82 0.44 - 1.00 mg/dL   Calcium 8.3 (L) 8.9 - 10.3 mg/dL   Total Protein 6.7 6.5 - 8.1 g/dL   Albumin 3.0 (L) 3.5 - 5.0 g/dL   AST 18 15 - 41 U/L   ALT 16 0 - 44 U/L   Alkaline Phosphatase 79 38 - 126 U/L   Total Bilirubin 0.5 0.3 - 1.2 mg/dL   GFR calc non Af Amer >60 >60 mL/min   GFR calc Af Amer >60 >60 mL/min   Anion gap 9 5 - 15   MDM UA CBC, CMP, Protein/creat ratio Tylenol 1000mg  PO- patient reports HA went from a 4 to 3/10 Flexeril PO- patient refused   Consulted with Dr. Vergie Living- labs and BP stable, ok to discharge patient home to follow up in the office on Monday  Patient upset that she is not being induced today. She states, "this decision is just doctor and midwife preference." She states she feels like "everyone tells her something different" and "doesn't want to have to keep coming to MAU and wasting time."  Lengthy discussion with patient regarding indications for induction with chronic  hypertension. Signs and symptoms of preeclampsia reviewed with patient and family. Patient and family members angry and verbalized understanding.   Assessment and Plan   1. Chronic hypertension during pregnancy, antepartum   2. [redacted] weeks gestation of pregnancy    -Discharge home in stable condition -Preeclampsia precautions discussed -Patient advised to follow-up with [facility] on Monday with an MD. -Patient may return to MAU as needed or if her condition were to change or worsen  Rolm Bookbinder CNM 05/08/2018, 10:54 AM

## 2018-05-08 NOTE — Discharge Instructions (Signed)
Hypertension During Pregnancy °Hypertension, commonly called high blood pressure, is when the force of blood pumping through your arteries is too strong. Arteries are blood vessels that carry blood from the heart throughout the body. Hypertension during pregnancy can cause problems for you and your baby. Your baby may be born early (prematurely) or may not weigh as much as he or she should at birth. Very bad cases of hypertension during pregnancy can be life-threatening. °Different types of hypertension can occur during pregnancy. These include: °· Chronic hypertension. This happens when: °? You have hypertension before pregnancy and it continues during pregnancy. °? You develop hypertension before you are [redacted] weeks pregnant, and it continues during pregnancy. °· Gestational hypertension. This is hypertension that develops after the 20th week of pregnancy. °· Preeclampsia, also called toxemia of pregnancy. This is a very serious type of hypertension that develops only during pregnancy. It affects the whole body, and it can be very dangerous for you and your baby. ° °Gestational hypertension and preeclampsia usually go away within 6 weeks after your baby is born. Women who have hypertension during pregnancy have a greater chance of developing hypertension later in life or during future pregnancies. °What are the causes? °The exact cause of hypertension is not known. °What increases the risk? °There are certain factors that make it more likely for you to develop hypertension during pregnancy. These include: °· Having hypertension during a previous pregnancy or prior to pregnancy. °· Being overweight. °· Being older than age 40. °· Being pregnant for the first time or being pregnant with more than one baby. °· Becoming pregnant using fertilization methods such as IVF (in vitro fertilization). °· Having diabetes, kidney problems, or systemic lupus erythematosus. °· Having a family history of hypertension. ° °What are the  signs or symptoms? °Chronic hypertension and gestational hypertension rarely cause symptoms. Preeclampsia causes symptoms, which may include: °· Increased protein in your urine. Your health care provider will check for this at every visit before you give birth (prenatal visit). °· Severe headaches. °· Sudden weight gain. °· Swelling of the hands, face, legs, and feet. °· Nausea and vomiting. °· Vision problems, such as blurred or double vision. °· Numbness in the face, arms, legs, and feet. °· Dizziness. °· Slurred speech. °· Sensitivity to bright lights. °· Abdominal pain. °· Convulsions. ° °How is this diagnosed? °You may be diagnosed with hypertension during a routine prenatal exam. At each prenatal visit, you may: °· Have a urine test to check for high amounts of protein in your urine. °· Have your blood pressure checked. A blood pressure reading is recorded as two numbers, such as "120 over 80" (or 120/80). The first ("top") number is called the systolic pressure. It is a measure of the pressure in your arteries when your heart beats. The second ("bottom") number is called the diastolic pressure. It is a measure of the pressure in your arteries as your heart relaxes between beats. Blood pressure is measured in a unit called mm Hg. A normal blood pressure reading is: °? Systolic: below 120. °? Diastolic: below 80. ° °The type of hypertension that you are diagnosed with depends on your test results and when your symptoms developed. °· Chronic hypertension is usually diagnosed before 20 weeks of pregnancy. °· Gestational hypertension is usually diagnosed after 20 weeks of pregnancy. °· Hypertension with high amounts of protein in the urine is diagnosed as preeclampsia. °· Blood pressure measurements that stay above 160 systolic, or above 110 diastolic, are   signs of severe preeclampsia. ° °How is this treated? °Treatment for hypertension during pregnancy varies depending on the type of hypertension you have and how  serious it is. °· If you take medicines called ACE inhibitors to treat chronic hypertension, you may need to switch medicines. ACE inhibitors should not be taken during pregnancy. °· If you have gestational hypertension, you may need to take blood pressure medicine. °· If you are at risk for preeclampsia, your health care provider may recommend that you take a low-dose aspirin every day to prevent high blood pressure during your pregnancy. °· If you have severe preeclampsia, you may need to be hospitalized so you and your baby can be monitored closely. You may also need to take medicine (magnesium sulfate) to prevent seizures and to lower blood pressure. This medicine may be given as an injection or through an IV tube. °· In some cases, if your condition gets worse, you may need to deliver your baby early. ° °Follow these instructions at home: °Eating and drinking °· Drink enough fluid to keep your urine clear or pale yellow. °· Eat a healthy diet that is low in salt (sodium). Do not add salt to your food. Check food labels to see how much sodium a food or beverage contains. °Lifestyle °· Do not use any products that contain nicotine or tobacco, such as cigarettes and e-cigarettes. If you need help quitting, ask your health care provider. °· Do not use alcohol. °· Avoid caffeine. °· Avoid stress as much as possible. Rest and get plenty of sleep. °General instructions °· Take over-the-counter and prescription medicines only as told by your health care provider. °· While lying down, lie on your left side. This keeps pressure off your baby. °· While sitting or lying down, raise (elevate) your feet. Try putting some pillows under your lower legs. °· Exercise regularly. Ask your health care provider what kinds of exercise are best for you. °· Keep all prenatal and follow-up visits as told by your health care provider. This is important. °Contact a health care provider if: °· You have symptoms that your health care  provider told you may require more treatment or monitoring, such as: °? Fever. °? Vomiting. °? Headache. °Get help right away if: °· You have severe abdominal pain or vomiting that does not get better with treatment. °· You suddenly develop swelling in your hands, ankles, or face. °· You gain 4 lbs (1.8 kg) or more in 1 week. °· You develop vaginal bleeding, or you have blood in your urine. °· You do not feel your baby moving as much as usual. °· You have blurred or double vision. °· You have muscle twitching or sudden tightening (spasms). °· You have shortness of breath. °· Your lips or fingernails turn blue. °This information is not intended to replace advice given to you by your health care provider. Make sure you discuss any questions you have with your health care provider. °Document Released: 05/22/2011 Document Revised: 03/23/2016 Document Reviewed: 02/17/2016 °Elsevier Interactive Patient Education © 2018 [employer] ° °Safe Medications in Pregnancy  ° °Acne: °Benzoyl Peroxide °Salicylic Acid ° °Backache/Headache: °Tylenol: 2 regular strength every 4 hours OR °             2 Extra strength every 6 hours ° °Colds/Coughs/Allergies: °Benadryl (alcohol free) 25 mg every 6 hours as needed °Breath right strips °Claritin °Cepacol throat lozenges °Chloraseptic throat spray °Cold-Eeze- up to three times per day °Cough drops, alcohol free °Flonase (by prescription   only) °Guaifenesin °Mucinex °Robitussin DM (plain only, alcohol free) °Saline nasal spray/drops °Sudafed (pseudoephedrine) & Actifed ** use only after [redacted] weeks gestation and if you do not have high blood pressure °Tylenol °Vicks Vaporub °Zinc lozenges °Zyrtec  ° °Constipation: °Colace °Ducolax suppositories °Fleet enema °Glycerin suppositories °Metamucil °Milk of magnesia °Miralax °Senokot °Smooth move tea ° °Diarrhea: °Kaopectate °Imodium A-D ° °*NO pepto Bismol ° °Hemorrhoids: °Anusol °Anusol HC °Preparation  H °Tucks ° °Indigestion: °Tums °Maalox °Mylanta °Zantac  °Pepcid ° °Insomnia: °Benadryl (alcohol free) 25mg every 6 hours as needed °Tylenol PM °Unisom, no Gelcaps ° °Leg Cramps: °Tums °MagGel ° °Nausea/Vomiting:  °Bonine °Dramamine °Emetrol °Ginger extract °Sea bands °Meclizine  °Nausea medication to take during pregnancy:  °Unisom (doxylamine succinate 25 mg tablets) Take one tablet daily at bedtime. If symptoms are not adequately controlled, the dose can be increased to a maximum recommended dose of two tablets daily (1/2 tablet in the morning, 1/2 tablet mid-afternoon and one at bedtime). °Vitamin B6 100mg tablets. Take one tablet twice a day (up to 200 mg per day). ° °Skin Rashes: °Aveeno products °Benadryl cream or 25mg every 6 hours as needed °Calamine Lotion °1% cortisone cream ° °Yeast infection: °Gyne-lotrimin 7 °Monistat 7 ° ° °**If taking multiple medications, please check labels to avoid duplicating the same active ingredients °**take medication as directed on the label °** Do not exceed 4000 mg of tylenol in 24 hours °**Do not take medications that contain aspirin or ibuprofen ° ° ° ° °

## 2018-05-09 ENCOUNTER — Encounter: Payer: Self-pay | Admitting: Obstetrics and Gynecology

## 2018-05-09 DIAGNOSIS — D696 Thrombocytopenia, unspecified: Secondary | ICD-10-CM | POA: Insufficient documentation

## 2018-05-09 DIAGNOSIS — O99119 Other diseases of the blood and blood-forming organs and certain disorders involving the immune mechanism complicating pregnancy, unspecified trimester: Secondary | ICD-10-CM

## 2018-05-12 ENCOUNTER — Ambulatory Visit (INDEPENDENT_AMBULATORY_CARE_PROVIDER_SITE_OTHER): Payer: 59 | Admitting: Obstetrics and Gynecology

## 2018-05-12 ENCOUNTER — Encounter: Payer: Self-pay | Admitting: Obstetrics and Gynecology

## 2018-05-12 VITALS — BP 150/90 | HR 85 | Wt 176.6 lb

## 2018-05-12 DIAGNOSIS — O10919 Unspecified pre-existing hypertension complicating pregnancy, unspecified trimester: Secondary | ICD-10-CM

## 2018-05-12 DIAGNOSIS — O409XX0 Polyhydramnios, unspecified trimester, not applicable or unspecified: Secondary | ICD-10-CM

## 2018-05-12 DIAGNOSIS — O99113 Other diseases of the blood and blood-forming organs and certain disorders involving the immune mechanism complicating pregnancy, third trimester: Secondary | ICD-10-CM

## 2018-05-12 DIAGNOSIS — D696 Thrombocytopenia, unspecified: Secondary | ICD-10-CM

## 2018-05-12 DIAGNOSIS — O403XX0 Polyhydramnios, third trimester, not applicable or unspecified: Secondary | ICD-10-CM

## 2018-05-12 DIAGNOSIS — O0993 Supervision of high risk pregnancy, unspecified, third trimester: Secondary | ICD-10-CM

## 2018-05-12 DIAGNOSIS — O10913 Unspecified pre-existing hypertension complicating pregnancy, third trimester: Secondary | ICD-10-CM

## 2018-05-12 NOTE — Progress Notes (Addendum)
Prenatal Visit Note Date: 05/12/2018 Clinic: [facility]  Subjective:  Jennalee Christel MormonHargrave Keelin is a 27 y.o. G2P1001 at 7069w6d being seen today for ongoing prenatal care.  She is currently monitored for the following issues for this high-risk pregnancy and has Migraine; Chronic hypertension during pregnancy, antepartum; Supervision of high-risk pregnancy; Polyhydramnios affecting pregnancy; and Gestational thrombocytopenia (HCC) on their problem list.  Patient reports no complaints.   Contractions: Irregular. Vag. Bleeding: None.  Movement: Present. Denies leaking of fluid.   The following portions of the patient's history were reviewed and updated as appropriate: allergies, current medications, past family history, past medical history, past social history, past surgical history and problem list. Problem list updated.  Objective:   Vitals:   05/12/18 1448 05/12/18 1449  BP: (!) 158/106 (!) 150/90  Pulse: (!) 103 85  Weight: 176 lb 9.6 oz (80.1 kg)     Fetal Status: Fetal Heart Rate (bpm): 144 Fundal Height: 38 cm Movement: Present  Presentation: Vertex  General:  Alert, oriented and cooperative. Patient is in no acute distress.  Skin: Skin is warm and dry. No rash noted.   Cardiovascular: Normal heart rate noted. Normal s1 and s2, no MRGs  Respiratory: Normal respiratory effort, no problems with respiration noted CTAB  Abdomen: Soft, gravid, appropriate for gestational age. Pain/Pressure: Present     Pelvic:  Cervical exam deferred        Extremities: Normal range of motion.  Edema: Trace  Mental Status: Normal mood and affect. Normal behavior. Normal judgment and thought content.  Neuro: brachial 1+ b/l  Urinalysis:      Assessment and Plan:  Pregnancy: G2P1001 at 4869w6d  1. Chronic hypertension during pregnancy, antepartum On no meds and BPs less than 160s/110s. Has BP cuff at home and recommend checking there, too. No s/s of pre-eclampsia. Precautions  given. Will check surveillance labs. Has 8/29 bpp and nst. Will set up for 38/6 or 39/0 IOL today. D/w her re: indications for earlier delivery.  - CBC - CMP and Liver - Protein / creatinine ratio, urine  2. Supervision of high risk pregnancy in third trimester  3. Polyhydramnios affecting pregnancy See above  4. Benign gestational thrombocytopenia in third trimester [facility](HCC) See above  Term labor symptoms and general obstetric precautions including but not limited to vaginal bleeding, contractions, leaking of fluid and fetal movement were reviewed in detail with the patient. Please refer to After Visit Summary for other counseling recommendations.  Return in about 3 days (around 05/15/2018) for nst/hrob visit.   [place], Haidee Stogsdill, MD

## 2018-05-13 ENCOUNTER — Telehealth (HOSPITAL_COMMUNITY): Payer: Self-pay | Admitting: *Deleted

## 2018-05-13 ENCOUNTER — Encounter (HOSPITAL_COMMUNITY): Payer: Self-pay | Admitting: *Deleted

## 2018-05-13 LAB — CMP AND LIVER
ALK PHOS: 84 IU/L (ref 39–117)
ALT: 15 IU/L (ref 0–32)
AST: 20 IU/L (ref 0–40)
Albumin: 3.5 g/dL (ref 3.5–5.5)
BILIRUBIN, DIRECT: 0.05 mg/dL (ref 0.00–0.40)
BUN: 6 mg/dL (ref 6–20)
Bilirubin Total: <0.2 mg/dL (ref 0.0–1.2)
CO2: 20 mmol/L (ref 20–29)
Calcium: 8.5 mg/dL — ABNORMAL LOW (ref 8.7–10.2)
Chloride: 103 mmol/L (ref 96–106)
Creatinine, Ser: 0.62 mg/dL (ref 0.57–1.00)
GFR calc Af Amer: 143 mL/min/{1.73_m2} (ref >59)
GFR calc non Af Amer: 124 mL/min/{1.73_m2} (ref >59)
Glucose: 98 mg/dL (ref 65–99)
Potassium: 3.9 mmol/L (ref 3.5–5.2)
Sodium: 137 mmol/L (ref 134–144)
Total Protein: 6 g/dL (ref 6.0–8.5)

## 2018-05-13 LAB — CBC
HEMATOCRIT: 34.7 % (ref 34.0–46.6)
Hemoglobin: 11.5 g/dL (ref 11.1–15.9)
MCH: 30.3 pg (ref 26.6–33.0)
MCHC: 33.1 g/dL (ref 31.5–35.7)
MCV: 91 fL (ref 79–97)
Platelets: 142 10*3/uL — ABNORMAL LOW (ref 150–450)
RBC: 3.8 x10E6/uL (ref 3.77–5.28)
RDW: 12.7 % (ref 12.3–15.4)
WBC: 8.3 10*3/uL (ref 3.4–10.8)

## 2018-05-13 LAB — PROTEIN / CREATININE RATIO, URINE
CREATININE, UR: 132.2 mg/dL
PROTEIN UR: 17.2 mg/dL
PROTEIN/CREAT RATIO: 130 mg/g{creat} (ref 0–200)

## 2018-05-13 NOTE — Telephone Encounter (Signed)
Preadmission screen  

## 2018-05-14 ENCOUNTER — Other Ambulatory Visit: Payer: Self-pay

## 2018-05-14 ENCOUNTER — Inpatient Hospital Stay (HOSPITAL_COMMUNITY)
Admission: AD | Admit: 2018-05-14 | Discharge: 2018-05-14 | Disposition: A | Discharge status: Home / Self Care | Payer: 59 | Source: Ambulatory Visit | Attending: Obstetrics and Gynecology | Admitting: Obstetrics and Gynecology

## 2018-05-14 ENCOUNTER — Encounter (HOSPITAL_COMMUNITY): Payer: Self-pay | Admitting: *Deleted

## 2018-05-14 DIAGNOSIS — O471 False labor at or after 37 completed weeks of gestation: Secondary | ICD-10-CM

## 2018-05-14 DIAGNOSIS — O0993 Supervision of high risk pregnancy, unspecified, third trimester: Secondary | ICD-10-CM

## 2018-05-14 HISTORY — DX: Calculus of kidney: N20.0

## 2018-05-14 NOTE — MAU Note (Signed)
Contractions started yesterday around 1330.  They are not regular. No bleeding or leaking. Was 1+cm when last checked.

## 2018-05-14 NOTE — Discharge Instructions (Signed)
Braxton Hicks Contractions °Contractions of the uterus can occur throughout pregnancy, but they are not always a sign that you are in labor. You may have practice contractions called Braxton Hicks contractions. These false labor contractions are sometimes confused with true labor. °What are Braxton Hicks contractions? °Braxton Hicks contractions are tightening movements that occur in the muscles of the uterus before labor. Unlike true labor contractions, these contractions do not result in opening (dilation) and thinning of the cervix. Toward the end of pregnancy (32-34 weeks), Braxton Hicks contractions can happen more often and may become stronger. These contractions are sometimes difficult to tell apart from true labor because they can be very uncomfortable. You should not feel embarrassed if you go to the hospital with false labor. °Sometimes, the only way to tell if you are in true labor is for your health care provider to look for changes in the cervix. The health care provider will do a physical exam and may monitor your contractions. If you are not in true labor, the exam should show that your cervix is not dilating and your water has not broken. °If there are other health problems associated with your pregnancy, it is completely safe for you to be sent home with false labor. You may continue to have Braxton Hicks contractions until you go into true labor. °How to tell the difference between true labor and false labor °True labor °· Contractions last 30-70 seconds. °· Contractions become very regular. °· Discomfort is usually felt in the top of the uterus, and it spreads to the lower abdomen and low back. °· Contractions do not go away with walking. °· Contractions usually become more intense and increase in frequency. °· The cervix dilates and gets thinner. °False labor °· Contractions are usually shorter and not as strong as true labor contractions. °· Contractions are usually irregular. °· Contractions  are often felt in the front of the lower abdomen and in the groin. °· Contractions may go away when you walk around or change positions while lying down. °· Contractions get weaker and are shorter-lasting as time goes on. °· The cervix usually does not dilate or become thin. °Follow these instructions at home: °· Take over-the-counter and prescription medicines only as told by your health care provider. °· Keep up with your usual exercises and follow other instructions from your health care provider. °· Eat and drink lightly if you think you are going into labor. °· If Braxton Hicks contractions are making you uncomfortable: °? Change your position from lying down or resting to walking, or change from walking to resting. °? Sit and rest in a tub of warm water. °? Drink enough fluid to keep your urine pale yellow. Dehydration may cause these contractions. °? Do slow and deep breathing several times an hour. °· Keep all follow-up prenatal visits as told by your health care provider. This is important. °Contact a health care provider if: °· You have a fever. °· You have continuous pain in your abdomen. °Get help right away if: °· Your contractions become stronger, more regular, and closer together. °· You have fluid leaking or gushing from your vagina. °· You pass blood-tinged mucus (bloody show). °· You have bleeding from your vagina. °· You have low back pain that you never had before. °· You feel your baby’s head pushing down and causing pelvic pressure. °· Your baby is not moving inside you as much as it used to. °Summary °· Contractions that occur before labor are called Braxton   Hicks contractions, false labor, or practice contractions. °· Braxton Hicks contractions are usually shorter, weaker, farther apart, and less regular than true labor contractions. True labor contractions usually become progressively stronger and regular and they become more frequent. °· Manage discomfort from Braxton Hicks contractions by  changing position, resting in a warm bath, drinking plenty of water, or practicing deep breathing. °This information is not intended to replace advice given to you by your health care provider. Make sure you discuss any questions you have with your health care provider. °Document Released: 01/17/2017 Document Revised: 01/17/2017 Document Reviewed: 01/17/2017 °Elsevier Interactive Patient Education © 2018 [employer] ° °Fetal Movement Counts °Patient Name: ________________________________________________ Patient Due Date: ____________________ °What is a fetal movement count? °A fetal movement count is the number of times that you feel your baby move during a certain amount of time. This may also be called a fetal kick count. A fetal movement count is recommended for every pregnant woman. You may be asked to start counting fetal movements as early as week 28 of your pregnancy. °Pay attention to when your baby is most active. You may notice your baby's sleep and wake cycles. You may also notice things that make your baby move more. You should do a fetal movement count: °· When your baby is normally most active. °· At the same time each day. ° °A good time to count movements is while you are resting, after having something to eat and drink. °How do I count fetal movements? °1. Find a quiet, comfortable area. Sit, or lie down on your side. °2. Write down the date, the start time and stop time, and the number of movements that you felt between those two times. Take this information with you to your health care visits. °3. For 2 hours, count kicks, flutters, swishes, rolls, and jabs. You should feel at least 10 movements during 2 hours. °4. You may stop counting after you have felt 10 movements. °5. If you do not feel 10 movements in 2 hours, have something to eat and drink. Then, keep resting and counting for 1 hour. If you feel at least 4 movements during that hour, you may stop counting. °Contact a health care  provider if: °· You feel fewer than 4 movements in 2 hours. °· Your baby is not moving like he or she usually does. °Date: ____________ Start time: ____________ Stop time: ____________ Movements: ____________ °Date: ____________ Start time: ____________ Stop time: ____________ Movements: ____________ °Date: ____________ Start time: ____________ Stop time: ____________ Movements: ____________ °Date: ____________ Start time: ____________ Stop time: ____________ Movements: ____________ °Date: ____________ Start time: ____________ Stop time: ____________ Movements: ____________ °Date: ____________ Start time: ____________ Stop time: ____________ Movements: ____________ °Date: ____________ Start time: ____________ Stop time: ____________ Movements: ____________ °Date: ____________ Start time: ____________ Stop time: ____________ Movements: ____________ °Date: ____________ Start time: ____________ Stop time: ____________ Movements: ____________ °This information is not intended to replace advice given to you by your health care provider. Make sure you discuss any questions you have with your health care provider. °Document Released: 10/03/2006 Document Revised: 05/02/2016 Document Reviewed: 10/13/2015 °Elsevier Interactive Patient Education © 2018 [employer] ° °

## 2018-05-15 ENCOUNTER — Other Ambulatory Visit (HOSPITAL_COMMUNITY): Payer: Self-pay | Admitting: Obstetrics and Gynecology

## 2018-05-15 ENCOUNTER — Encounter (HOSPITAL_COMMUNITY): Payer: Self-pay

## 2018-05-15 ENCOUNTER — Encounter: Payer: 59 | Admitting: Obstetrics and Gynecology

## 2018-05-15 ENCOUNTER — Ambulatory Visit (HOSPITAL_COMMUNITY)
Admission: RE | Admit: 2018-05-15 | Discharge: 2018-05-15 | Disposition: A | Discharge status: Home / Self Care | Payer: 59 | Source: Ambulatory Visit | Attending: Family Medicine | Admitting: Family Medicine

## 2018-05-15 ENCOUNTER — Ambulatory Visit: Payer: 59

## 2018-05-15 ENCOUNTER — Other Ambulatory Visit: Payer: Self-pay | Admitting: Obstetrics & Gynecology

## 2018-05-15 ENCOUNTER — Telehealth (HOSPITAL_COMMUNITY): Payer: Self-pay | Admitting: *Deleted

## 2018-05-15 ENCOUNTER — Ambulatory Visit (HOSPITAL_BASED_OUTPATIENT_CLINIC_OR_DEPARTMENT_OTHER)
Admission: RE | Admit: 2018-05-15 | Discharge: 2018-05-15 | Disposition: A | Discharge status: Home / Self Care | Payer: 59 | Source: Ambulatory Visit | Attending: Family Medicine | Admitting: Family Medicine

## 2018-05-15 VITALS — BP 134/85 | HR 73

## 2018-05-15 DIAGNOSIS — O10913 Unspecified pre-existing hypertension complicating pregnancy, third trimester: Secondary | ICD-10-CM

## 2018-05-15 DIAGNOSIS — Z3A38 38 weeks gestation of pregnancy: Secondary | ICD-10-CM

## 2018-05-15 DIAGNOSIS — O10919 Unspecified pre-existing hypertension complicating pregnancy, unspecified trimester: Secondary | ICD-10-CM

## 2018-05-15 DIAGNOSIS — O403XX0 Polyhydramnios, third trimester, not applicable or unspecified: Secondary | ICD-10-CM

## 2018-05-15 DIAGNOSIS — O409XX0 Polyhydramnios, unspecified trimester, not applicable or unspecified: Secondary | ICD-10-CM

## 2018-05-15 NOTE — Telephone Encounter (Signed)
Preadmission screen  

## 2018-05-15 NOTE — Procedures (Signed)
Cynthia Crawford 13-Sep-1991 1737w2d  Fetus A Non-Stress Test Interpretation for 05/15/18  Indication: Polyhydramnios  Fetal Heart Rate A Mode: External Baseline Rate (A): 140 bpm Variability: Moderate Accelerations: 15 x 15 Decelerations: None Multiple birth?: No  Uterine Activity Mode: Toco Contraction Frequency (min): none noted Contraction Quality: Mild Resting Tone Palpated: Relaxed Resting Time: Adequate  Interpretation (Fetal Testing) Nonstress Test Interpretation: Reactive Comments: FHR tracing rev'd by Dr. Judeth CornfieldShankar

## 2018-05-15 NOTE — ED Notes (Signed)
Pt denies vag bleeding today, notes blood in her urine this morning.  Hx kidney stones, had back pain yesterday.  Pt will notify her OB.

## 2018-05-15 NOTE — Progress Notes (Signed)
Patient seen at MFM office today.  Upon review of her recent BPs and rest of her history, Dr. Dewayne ShorterShanker recommended IOL soon but not urgently.  IOL rescheduled to 05/17/18 at 0700, he and the patient agreed with this plan. Patient will get outpatient foley placement during her clinic appointment at [facility] tomorrow (05/16/18); Dr. Vergie LivingPickens (her OB provider) and [facility] aware of this procedure addition.  Preeclampsia precautions and labor precautions strictly emphasized.     Cynthia CollinsUGONNA  Sherian Valenza, MD, FACOG Obstetrician & Gynecologist, [facility], [facility]

## 2018-05-16 ENCOUNTER — Encounter: Payer: 59 | Admitting: Obstetrics and Gynecology

## 2018-05-16 ENCOUNTER — Other Ambulatory Visit: Payer: 59

## 2018-05-16 ENCOUNTER — Ambulatory Visit (INDEPENDENT_AMBULATORY_CARE_PROVIDER_SITE_OTHER): Payer: 59 | Admitting: Obstetrics and Gynecology

## 2018-05-16 VITALS — BP 146/90 | HR 73 | Wt 174.4 lb

## 2018-05-16 DIAGNOSIS — O0993 Supervision of high risk pregnancy, unspecified, third trimester: Secondary | ICD-10-CM

## 2018-05-16 NOTE — Progress Notes (Signed)
Prenatal Visit Note Date: 05/16/2018 [facility]  Subjective:  Cynthia Crawford is a 27 y.o. G2P1001 at 2765w3d being seen today for ongoing prenatal care.  She is currently monitored for the following issues for this high-risk pregnancy and has Migraine; Chronic hypertension during pregnancy, antepartum; Supervision of high-risk pregnancy; Polyhydramnios affecting pregnancy; and Gestational thrombocytopenia (HCC) on their problem list.  Patient reports no complaints.   Contractions: Irregular. Vag. Bleeding: None.  Movement: Present. Denies leaking of fluid.   The following portions of the patient's history were reviewed and updated as appropriate: allergies, current medications, past family history, past medical history, past social history, past surgical history and problem list. Problem list updated.  Objective:   Vitals:   05/16/18 0842  BP: (!) 146/90  Pulse: 73  Weight: 174 lb 6.4 oz (79.1 kg)    Fetal Status: Fetal Heart Rate (bpm): 140s   Movement: Present  Presentation: Vertex  General:  Alert, oriented and cooperative. Patient is in no acute distress.  Skin: Skin is warm and dry. No rash noted.   Cardiovascular: Normal heart rate noted  Respiratory: Normal respiratory effort, no problems with respiration noted  Abdomen: Soft, gravid, appropriate for gestational age. Pain/Pressure: Present     Pelvic:  Cervical exam performed Dilation: 3.5 Effacement (%): 50 Station: -3  Extremities: Normal range of motion.  Edema: Trace  Mental Status: Normal mood and affect. Normal behavior. Normal judgment and thought content.   Urinalysis:      Assessment and Plan:  Pregnancy: G2P1001 at 2765w3d  Bedside u/s, cephalic. Cervix already favorable. Foley not placed. For IOL tomorrow morning.   Term labor symptoms and general obstetric precautions including but not limited to vaginal bleeding, contractions, leaking of fluid and fetal movement were  reviewed in detail with the patient. Please refer to After Visit Summary for other counseling recommendations.  Return in about 2 weeks (around 05/30/2018) for 2-3wk pp visit.   Cynthia Crawford, Cynthia Mackowiak, MD

## 2018-05-16 NOTE — Progress Notes (Signed)
Hematuria x yesterday

## 2018-05-17 ENCOUNTER — Encounter (HOSPITAL_COMMUNITY): Payer: Self-pay

## 2018-05-17 ENCOUNTER — Inpatient Hospital Stay (HOSPITAL_COMMUNITY): Payer: 59 | Admitting: Anesthesiology

## 2018-05-17 ENCOUNTER — Inpatient Hospital Stay (HOSPITAL_COMMUNITY)
Admission: RE | Admit: 2018-05-17 | Discharge: 2018-05-19 | DRG: 806 | Disposition: A | Discharge status: Home / Self Care | Payer: 59 | Attending: Obstetrics and Gynecology | Admitting: Obstetrics and Gynecology

## 2018-05-17 ENCOUNTER — Other Ambulatory Visit: Payer: Self-pay

## 2018-05-17 DIAGNOSIS — Z880 Allergy status to penicillin: Secondary | ICD-10-CM

## 2018-05-17 DIAGNOSIS — O1002 Pre-existing essential hypertension complicating childbirth: Secondary | ICD-10-CM | POA: Diagnosis not present

## 2018-05-17 DIAGNOSIS — O9912 Other diseases of the blood and blood-forming organs and certain disorders involving the immune mechanism complicating childbirth: Secondary | ICD-10-CM | POA: Diagnosis not present

## 2018-05-17 DIAGNOSIS — Z3A38 38 weeks gestation of pregnancy: Secondary | ICD-10-CM

## 2018-05-17 DIAGNOSIS — O10919 Unspecified pre-existing hypertension complicating pregnancy, unspecified trimester: Secondary | ICD-10-CM

## 2018-05-17 DIAGNOSIS — O0993 Supervision of high risk pregnancy, unspecified, third trimester: Secondary | ICD-10-CM

## 2018-05-17 DIAGNOSIS — O403XX0 Polyhydramnios, third trimester, not applicable or unspecified: Secondary | ICD-10-CM | POA: Diagnosis present

## 2018-05-17 DIAGNOSIS — D6959 Other secondary thrombocytopenia: Secondary | ICD-10-CM | POA: Diagnosis present

## 2018-05-17 DIAGNOSIS — Z3A37 37 weeks gestation of pregnancy: Secondary | ICD-10-CM | POA: Diagnosis not present

## 2018-05-17 LAB — CBC
HCT: 37.4 % (ref 36.0–46.0)
HEMATOCRIT: 36 % (ref 36.0–46.0)
HEMOGLOBIN: 12.7 g/dL (ref 12.0–15.0)
HEMOGLOBIN: 12.1 g/dL (ref 12.0–15.0)
MCH: 31.1 pg (ref 26.0–34.0)
MCH: 30.9 pg (ref 26.0–34.0)
MCHC: 34 g/dL (ref 30.0–36.0)
MCHC: 33.6 g/dL (ref 30.0–36.0)
MCV: 91.4 fL (ref 78.0–100.0)
MCV: 92.1 fL (ref 78.0–100.0)
PLATELETS: 143 10*3/uL — AB (ref 150–400)
Platelets: 140 10*3/uL — ABNORMAL LOW (ref 150–400)
RBC: 4.09 MIL/uL (ref 3.87–5.11)
RBC: 3.91 MIL/uL (ref 3.87–5.11)
RDW: 13.4 % (ref 11.5–15.5)
RDW: 13.7 % (ref 11.5–15.5)
WBC: 8.9 10*3/uL (ref 4.0–10.5)
WBC: 16.1 10*3/uL — ABNORMAL HIGH (ref 4.0–10.5)

## 2018-05-17 LAB — RPR: RPR Ser Ql: NONREACTIVE

## 2018-05-17 LAB — TYPE AND SCREEN
ABO/RH(D): O POS
Antibody Screen: NEGATIVE

## 2018-05-17 MED ORDER — LABETALOL HCL 5 MG/ML IV SOLN: 80.0000 mg | INTRAVENOUS | Status: DC | PRN

## 2018-05-17 MED ORDER — OXYTOCIN BOLUS FROM INFUSION
500.0000 mL | Freq: Once | INTRAVENOUS | Status: AC
  Administered 2018-05-17: 500 mL via INTRAVENOUS

## 2018-05-17 MED ORDER — HYDROXYZINE HCL 50 MG PO TABS
50.0000 mg | ORAL_TABLET | Freq: Four times a day (QID) | ORAL | Status: DC | PRN
  Filled 2018-05-17: qty 1

## 2018-05-17 MED ORDER — LABETALOL HCL 5 MG/ML IV SOLN: 40.0000 mg | INTRAVENOUS | Status: DC | PRN

## 2018-05-17 MED ORDER — LIDOCAINE HCL (PF) 1 % IJ SOLN
INTRAMUSCULAR | Status: DC | PRN
  Administered 2018-05-17: 4 mL via EPIDURAL
  Administered 2018-05-17: 6 mL via EPIDURAL
  Administered 2018-05-17: 4 mL via EPIDURAL

## 2018-05-17 MED ORDER — DIPHENHYDRAMINE HCL 50 MG/ML IJ SOLN: 12.5000 mg | INTRAMUSCULAR | Status: DC | PRN

## 2018-05-17 MED ORDER — MISOPROSTOL 25 MCG QUARTER TABLET: 25.0000 ug | ORAL_TABLET | ORAL | Status: DC | PRN

## 2018-05-17 MED ORDER — OXYTOCIN 40 UNITS IN LACTATED RINGERS INFUSION - SIMPLE MED
1.0000 m[IU]/min | INTRAVENOUS | Status: DC
  Administered 2018-05-17: 2 m[IU]/min via INTRAVENOUS
  Filled 2018-05-17: qty 1000

## 2018-05-17 MED ORDER — FLEET ENEMA 7-19 GM/118ML RE ENEM: 1.0000 | ENEMA | Freq: Every day | RECTAL | Status: DC | PRN

## 2018-05-17 MED ORDER — OXYCODONE-ACETAMINOPHEN 5-325 MG PO TABS: 1.0000 | ORAL_TABLET | ORAL | Status: DC | PRN

## 2018-05-17 MED ORDER — TERBUTALINE SULFATE 1 MG/ML IJ SOLN
0.2500 mg | Freq: Once | INTRAMUSCULAR | Status: DC | PRN
  Filled 2018-05-17: qty 1

## 2018-05-17 MED ORDER — OXYCODONE-ACETAMINOPHEN 5-325 MG PO TABS: 2.0000 | ORAL_TABLET | ORAL | Status: DC | PRN

## 2018-05-17 MED ORDER — SOD CITRATE-CITRIC ACID 500-334 MG/5ML PO SOLN
30.0000 mL | ORAL | Status: DC | PRN
  Administered 2018-05-17: 30 mL via ORAL
  Filled 2018-05-17: qty 15

## 2018-05-17 MED ORDER — FENTANYL 2.5 MCG/ML BUPIVACAINE 1/10 % EPIDURAL INFUSION (WH - ANES)
14.0000 mL/h | INTRAMUSCULAR | Status: DC | PRN
  Administered 2018-05-17: 14 mL/h via EPIDURAL
  Filled 2018-05-17: qty 100

## 2018-05-17 MED ORDER — ACETAMINOPHEN 325 MG PO TABS: 650.0000 mg | ORAL_TABLET | ORAL | Status: DC | PRN

## 2018-05-17 MED ORDER — ZOLPIDEM TARTRATE 5 MG PO TABS: 5.0000 mg | ORAL_TABLET | Freq: Every evening | ORAL | Status: DC | PRN

## 2018-05-17 MED ORDER — LABETALOL HCL 5 MG/ML IV SOLN: 20.0000 mg | INTRAVENOUS | Status: DC | PRN

## 2018-05-17 MED ORDER — PHENYLEPHRINE 40 MCG/ML (10ML) SYRINGE FOR IV PUSH (FOR BLOOD PRESSURE SUPPORT)
80.0000 ug | PREFILLED_SYRINGE | INTRAVENOUS | Status: DC | PRN
  Filled 2018-05-17: qty 5
  Filled 2018-05-17: qty 10

## 2018-05-17 MED ORDER — OXYTOCIN 40 UNITS IN LACTATED RINGERS INFUSION - SIMPLE MED: 2.5000 [IU]/h | INTRAVENOUS | Status: DC

## 2018-05-17 MED ORDER — FENTANYL CITRATE (PF) 100 MCG/2ML IJ SOLN
50.0000 ug | INTRAMUSCULAR | Status: DC | PRN
  Administered 2018-05-17: 100 ug via INTRAVENOUS
  Filled 2018-05-17: qty 2

## 2018-05-17 MED ORDER — LACTATED RINGERS IV SOLN
INTRAVENOUS | Status: DC
  Administered 2018-05-17 (×2): via INTRAVENOUS

## 2018-05-17 MED ORDER — LIDOCAINE HCL (PF) 1 % IJ SOLN
30.0000 mL | INTRAMUSCULAR | Status: DC | PRN
  Filled 2018-05-17: qty 30

## 2018-05-17 MED ORDER — PHENYLEPHRINE 40 MCG/ML (10ML) SYRINGE FOR IV PUSH (FOR BLOOD PRESSURE SUPPORT)
80.0000 ug | PREFILLED_SYRINGE | INTRAVENOUS | Status: DC | PRN
  Filled 2018-05-17: qty 5

## 2018-05-17 MED ORDER — HYDRALAZINE HCL 20 MG/ML IJ SOLN: 10.0000 mg | INTRAMUSCULAR | Status: DC | PRN

## 2018-05-17 MED ORDER — ONDANSETRON HCL 4 MG/2ML IJ SOLN
4.0000 mg | Freq: Four times a day (QID) | INTRAMUSCULAR | Status: DC | PRN
  Administered 2018-05-17: 4 mg via INTRAVENOUS
  Filled 2018-05-17: qty 2

## 2018-05-17 MED ORDER — EPHEDRINE 5 MG/ML INJ
10.0000 mg | INTRAVENOUS | Status: DC | PRN
  Filled 2018-05-17: qty 2

## 2018-05-17 MED ORDER — LACTATED RINGERS IV SOLN
500.0000 mL | Freq: Once | INTRAVENOUS | Status: AC
  Administered 2018-05-17: 500 mL via INTRAVENOUS

## 2018-05-17 MED ORDER — LACTATED RINGERS IV SOLN: 500.0000 mL | INTRAVENOUS | Status: DC | PRN

## 2018-05-17 NOTE — Progress Notes (Signed)
Labor Progress Note Cynthia Crawford is a 27 y.o. G2P1001 at 2862w4d presented for IOL for cHTN  S: Patient is doing well. Epidural controlling pain well. No complaints or questions at this point.   O:  BP 128/60   Pulse 68   Temp 97.9 F (36.6 C) (Oral)   Resp 16   Ht 5\' 4"  (1.626 m)   Wt 80.2 kg   LMP 08/20/2017   SpO2 100%   BMI 30.33 kg/m  EFM: 145/mod var/+accels/+occasional variables CVE: Dilation: 6 Effacement (%): 100 Cervical Position: Middle Station: Ballotable Presentation: Vertex Exam by:: L.mears,RN  A&P: 27 y.o. G2P1001 4762w4d IOL for cHTN.  #Labor: Progressing well. 6cm at 20:00, then progressed to 9cm at 22:00. Contractions q2-3 mins. Continuing on pit.  #Pain: Epidural controlling pain well.  #FWB: Cat I tracing.  #GBS neg #HTN: On no HTN meds; PCR (8/26) was WNL.   Raynelle HighlandJuliana W Mylani Gentry, Medical Student 9:32 PM

## 2018-05-17 NOTE — Progress Notes (Signed)
Patient ID: Cynthia JarvisChasity Hargrave Crawford, female   DOB: [birth year], 27 y.o.   MRN: [id number]  Pt just rec'd Fentanyl dose; teary  BP 146/87, P 70s FHR 130s, +accels, no decels, occ variable Ctx q 3mins with Pit @ 7512mu/min Cx was 5/70/ballot per RN exam  IUP@term  cHTN Early active labor  Wants to rest a bit and maybe try AROM later if vtx is low enough Continue Deretha Emoryit  Mailee Klaas CNM 05/17/2018 6:04 PM

## 2018-05-17 NOTE — Progress Notes (Signed)
Labor Progress Note Cynthia Crawford is a 27 y.o. G2P1001 at 7964w4d presented for IOL for cHTN.  S:   O:  BP 134/90   Pulse 67   Temp 98.5 F (36.9 C) (Oral)   Resp 16   Ht 5\' 4"  (1.626 m)   Wt 80.2 kg   LMP 08/20/2017   BMI 30.33 kg/m  EFM: 140/moder var/pos accels/no decels  CVE: Dilation: 4.5 Effacement (%): 60, 70 Cervical Position: Posterior Station: Ballotable Presentation: Vertex Exam by:: S Nix RN   A&P: 27 y.o. G2P1001 7064w4d  Here for IOL for chronic HTN. #Labor: Progressing on Pit #Pain: would like epidural #FWB: category I #GBS negative  #HTN: systolics in the 140s.continue to monitor   Mirian MoPeter Keili Hasten, MD 3:09 PM

## 2018-05-17 NOTE — Anesthesia Preprocedure Evaluation (Signed)
Anesthesia Evaluation    Airway Mallampati: II  TM Distance: >3 FB Neck ROM: Full    Dental no notable dental hx.    Pulmonary asthma ,    Pulmonary exam normal breath sounds clear to auscultation       Cardiovascular hypertension, Pt. on medications Normal cardiovascular exam Rhythm:Regular Rate:Normal     Neuro/Psych    GI/Hepatic   Endo/Other    Renal/GU      Musculoskeletal   Abdominal   Peds  Hematology   Anesthesia Other Findings   Reproductive/Obstetrics (+) Pregnancy                             Anesthesia Physical Anesthesia Plan  ASA: III  Anesthesia Plan: Epidural   Post-op Pain Management:    Induction:   PONV Risk Score and Plan: Treatment may vary due to age or medical condition  Airway Management Planned: Natural Airway  Additional Equipment:   Intra-op Plan:   Post-operative Plan:   Informed Consent: I have reviewed the patients History and Physical, chart, labs and discussed the procedure including the risks, benefits and alternatives for the proposed anesthesia with the patient or authorized representative who has indicated his/her understanding and acceptance.     Plan Discussed with: Anesthesiologist  Anesthesia Plan Comments: (Patient identified. Risks, benefits, options discussed with patient including but not limited to bleeding, infection, nerve damage, paralysis, failed block, incomplete pain control, headache, blood pressure changes, nausea, vomiting, reactions to medication, itching, and post partum back pain. Confirmed with bedside nurse the patient's most recent platelet count. Confirmed with the patient that they are not taking any anticoagulation, have any bleeding history or any family history of bleeding disorders. Patient expressed understanding and wishes to proceed. All questions were answered. )        Anesthesia Quick Evaluation

## 2018-05-17 NOTE — Anesthesia Pain Management Evaluation Note (Signed)
  CRNA Pain Management Visit Note  Patient: Cynthia Crawford, 27 y.o., female  "[facility] We have an anesthesia team available at all times to provide care throughout the hospital, including epidural management and anesthesia for C-section. I don't know your plan for the delivery whether it a natural birth, water birth, IV sedation, nitrous supplementation, doula or epidural, but we want to meet your pain goals."   1.Was your pain managed to your expectations on prior hospitalizations?   Yes   2.What is your expectation for pain management during this hospitalization?     Labor support without medications  3.How can we help you reach that goal? Patient plans natural  Record the patient's initial score and the patient's pain goal.   Pain: 1  Pain Goal: 10 [facility] wants you to be able to say your pain was always managed very well.  Rica RecordsICKELTON,Cynthia Crawford 05/17/2018

## 2018-05-17 NOTE — Progress Notes (Signed)
Pt up in bathroom.  Infant off of monitor.  Asked pt to return to bed so wired monitors can be applied

## 2018-05-17 NOTE — Anesthesia Procedure Notes (Signed)
Epidural Patient location during procedure: OB Start time: 05/17/2018 7:25 PM End time: 05/17/2018 7:35 PM  Staffing Anesthesiologist: Elmer PickerWoodrum, Izic Stfort L, MD Performed: anesthesiologist   Preanesthetic Checklist Completed: patient identified, pre-op evaluation, timeout performed, IV checked, risks and benefits discussed and monitors and equipment checked  Epidural Patient position: sitting Prep: site prepped and draped and DuraPrep Patient monitoring: continuous pulse ox, blood pressure, heart rate and cardiac monitor Approach: midline Location: L3-L4 Injection technique: LOR air  Needle:  Needle type: Tuohy  Needle gauge: 17 G Needle length: 9 cm Needle insertion depth: 5.5 cm Catheter type: closed end flexible Catheter size: 19 Gauge Catheter at skin depth: 10 cm Test dose: negative  Assessment Sensory level: T8 Events: blood not aspirated, injection not painful, no injection resistance, negative IV test and no paresthesia  Additional Notes Patient identified. Risks/Benefits/Options discussed with patient including but not limited to bleeding, infection, nerve damage, paralysis, failed block, incomplete pain control, headache, blood pressure changes, nausea, vomiting, reactions to medication both or allergic, itching and postpartum back pain. Confirmed with bedside nurse the patient's most recent platelet count. Confirmed with patient that they are not currently taking any anticoagulation, have any bleeding history or any family history of bleeding disorders. Patient expressed understanding and wished to proceed. All questions were answered. Sterile technique was used throughout the entire procedure. Please see nursing notes for vital signs. Test dose was given through epidural catheter and negative prior to continuing to dose epidural or start infusion. Warning signs of high block given to the patient including shortness of breath, tingling/numbness in hands, complete motor block,  or any concerning symptoms with instructions to call for help. Patient was given instructions on fall risk and not to get out of bed. All questions and concerns addressed with instructions to call with any issues or inadequate analgesia.  Reason for block:procedure for pain

## 2018-05-17 NOTE — H&P (Addendum)
OBSTETRIC ADMISSION HISTORY AND PHYSICAL  Cynthia Crawford is a 27 y.o. female G2P1001 with IUP at 8050w4d by L/19 presenting for IOL for CHTN. She reports +FMs, No LOF, no VB, no blurry vision, headaches or peripheral edema, and RUQ pain.  She plans on breast feeding. She request long acting birth control that will not interfere with breastfeeding. She [facility]  Her prenatal history is significant for: chronic HTN, polyhydramnios, gestational thrombocytopenia.  Dating: By LMP --->  Estimated Date of Delivery: 05/27/18  Sono:  05/01/2018 @[redacted]w[redacted]d , CWD, normal anatomy, cephalic presentation,  3325g, 89% EFW   Prenatal History/Complications:  Past Medical History: Past Medical History:  Diagnosis Date  . Asthma   . Chronic hypertension during pregnancy, antepartum 08/29/2017   [x]  Aspirin 81 mg daily after 12 weeks; discontinue after 36 weeks Current antihypertensives:  None  Was on Enalapril prior to pregnancy  Baseline and surveillance labs [facility] refresh [facility] as needed)  Lab Results Component Value Date  PLT 213 10/16/2017  CREATININE 0.61 10/16/2017  AST 17 10/16/2017  ALT 13 10/16/2017  PROTCRRATIO 0.21 (H) 01/17/2016  PROTEIN24HR 242 (H) 01/16/2016  . Contact dermatitis due to soap 11/20/2017  . History of gestational hypertension   . Kidney stones     Past Surgical History: Past Surgical History:  Procedure Laterality Date  . TONSILLECTOMY      Obstetrical History: OB History    Gravida  2   Para  1   Term  1   Preterm  0   AB  0   Living  1     SAB  0   TAB  0   Ectopic  0   Multiple  0   Live Births  1           Social History: Social History   Socioeconomic History  . Marital status: Married    Spouse name: Not on file  . Number of children: 1  . Years of education: Not on file  . Highest education level: Not on file  Occupational History  . Not on file  Social Needs  . Financial resource  strain: Not hard at all  . Food insecurity:    Worry: Never true    Inability: Never true  . Transportation needs:    Medical: No    Non-medical: No  Tobacco Use  . Smoking status: Never Smoker  . Smokeless tobacco: Never Used  Substance and Sexual Activity  . Alcohol use: No  . Drug use: No  . Sexual activity: Not Currently    Partners: Male    Birth control/protection: None  Lifestyle  . Physical activity:    Days per week: 0 days    Minutes per session: 0 min  . Stress: To some extent  Relationships  . Social connections:    Talks on phone: More than three times a week    Gets together: More than three times a week    Attends religious service: More than 4 times per year    Active member of club or organization: Yes    Attends meetings of clubs or organizations: More than 4 times per year    Relationship status: Married  Other Topics Concern  . Not on file  Social History Narrative  . Not on file    Family History: Family History  Problem Relation Age of Onset  . Diabetes Father   . Asthma Mother  Allergies: Allergies  Allergen Reactions  . Penicillins Hives    Has patient had a PCN reaction causing immediate rash, facial/tongue/throat swelling, SOB or lightheadedness with hypotension: Yes Has patient had a PCN reaction causing severe rash involving mucus membranes or skin necrosis: No Has patient had a PCN reaction that required hospitalization No Has patient had a PCN reaction occurring within the last 10 years: No If all of the above answers are "NO", then may proceed with Cephalosporin use.  . Sulfa Antibiotics Hives    Medications Prior to Admission  Medication Sig Dispense Refill Last Dose  . aspirin EC 81 MG tablet Take 81 mg by mouth daily.   05/16/2018 at Unknown time  . Prenatal Vit-Fe Fumarate-FA (MULTIVITAMIN-PRENATAL) 27-0.8 MG TABS tablet Take 1 tablet by mouth daily at 12 noon.   05/16/2018 at Unknown time     Review of Systems   All  systems reviewed and negative except as stated in HPI  Blood pressure (!) 146/100, pulse 78, resp. rate 16, height 5\' 4"  (1.626 m), weight 80.2 kg, last menstrual period 08/20/2017. General appearance: alert, cooperative, appears stated age and no distress Lungs: clear to auscultation bilaterally Heart: regular rate and rhythm Abdomen: soft, non-tender; bowel sounds normal Pelvic: see below Extremities: Homans sign is negative, no sign of DVT Presentation: cephalic Fetal monitoringBaseline: 145 bpm, Variability: Good {> 6 bpm), Accelerations: Reactive and Decelerations: Absent Uterine activityFrequency: Every 5 minutes Dilation: 3.5 Effacement (%): 50 Station: -3 Exam by:: S NIx RN   Prenatal labs: ABO, Rh: O/Positive/-- (01/30 1103) Antibody: Negative (01/30 1103) Rubella: 2.41 (01/30 1103) RPR: Non Reactive (06/19 0909)  HBsAg: Negative (01/30 1103)  HIV: Non Reactive (06/19 0909)  GBS:   GBS neg 04/28/18 1 hr Glucola neg Genetic screening  Low risk female Anatomy US bilaterally urinary tract dilation, renal pelvis 6 mm bilaterally  Prenatal Transfer Tool  Maternal Diabetes: No Genetic Screening: Normal Maternal Ultrasounds/Referrals: Normal Fetal Ultrasounds or other Referrals:  Referred to Materal Fetal Medicine  Maternal Substance Abuse:  No Significant Maternal Medications:  None Significant Maternal Lab Results: Lab values include: Group B Strep negative  Results for orders placed or performed during the hospital encounter of 05/17/18 (from the past 24 hour(s))  CBC   Collection Time: 05/17/18  8:13 AM  Result Value Ref Range   WBC 8.9 4.0 - 10.5 K/uL   RBC 4.09 3.87 - 5.11 MIL/uL   Hemoglobin 12.7 12.0 - 15.0 g/dL   HCT 16.1 09.6 - 04.5 %   MCV 91.4 78.0 - 100.0 fL   MCH 31.1 26.0 - 34.0 pg   MCHC 34.0 30.0 - 36.0 g/dL   RDW 40.9 81.1 - 91.4 %   Platelets 143 (L) 150 - 400 K/uL    Patient Active Problem List   Diagnosis Date Noted  . Labor and delivery  indication for care or intervention 05/17/2018  . Gestational thrombocytopenia (HCC) 05/09/2018  . Polyhydramnios affecting pregnancy 04/10/2018  . Supervision of high-risk pregnancy 10/16/2017  . Chronic hypertension during pregnancy, antepartum 08/29/2017  . Migraine 09/25/2011    Assessment/Plan:  Cam Harnden is a 27 y.o. G2P1001 at [redacted]w[redacted]d here for IOL for cHTN.  #Labor: progressing on pitocin #Pain: Open to epidural later on in labor #FWB: Category I #ID:  GBS - #MOF: breast #MOC: LARC #Circ:  Would like circ.  Mirian Mo, MD  05/17/2018, 9:30 AM  CNM attestation:  I have seen and examined this patient; I agree with above documentation  in the resident's note.   Cynthia Crawford is a 27 y.o. G2P1001 here for IOL due to cHTN (no meds); pre-e labs from 8/26 are negative (nl LFTs, creatinine 0.62, plts 143 on admission CBC, P/C ratio 0.13); no s/s pre-e  PE: BP 137/90   Pulse 67   Resp 18   Ht 5\' 4"  (1.626 m)   Wt 80.2 kg   LMP 08/20/2017   BMI 30.33 kg/m  Gen: calm comfortable, NAD Resp: normal effort, no distress Abd: gravid  ROS, labs, PMH reviewed  Plan: IUP@term  cHTN Cx favorable  Admit to [facility] for method of IOL; consider AROM as labor progresses Can repeat pre-e labs if indicated Anticipate SVD  Cam Hai CNM 05/17/2018, 12:18 PM

## 2018-05-18 ENCOUNTER — Encounter (HOSPITAL_COMMUNITY): Payer: Self-pay

## 2018-05-18 MED ORDER — IBUPROFEN 600 MG PO TABS
600.0000 mg | ORAL_TABLET | Freq: Four times a day (QID) | ORAL | Status: DC
  Administered 2018-05-18 – 2018-05-19 (×6): 600 mg via ORAL
  Filled 2018-05-18 (×6): qty 1

## 2018-05-18 MED ORDER — WITCH HAZEL-GLYCERIN EX PADS: 1.0000 "application " | MEDICATED_PAD | CUTANEOUS | Status: DC | PRN

## 2018-05-18 MED ORDER — OXYCODONE HCL 5 MG PO TABS: 5.0000 mg | ORAL_TABLET | ORAL | Status: DC | PRN

## 2018-05-18 MED ORDER — COCONUT OIL OIL: 1.0000 "application " | TOPICAL_OIL | Status: DC | PRN

## 2018-05-18 MED ORDER — VITAMIN K1 1 MG/0.5ML IJ SOLN
INTRAMUSCULAR | Status: AC
  Filled 2018-05-18: qty 0.5

## 2018-05-18 MED ORDER — ONDANSETRON HCL 4 MG/2ML IJ SOLN: 4.0000 mg | INTRAMUSCULAR | Status: DC | PRN

## 2018-05-18 MED ORDER — SENNOSIDES-DOCUSATE SODIUM 8.6-50 MG PO TABS
2.0000 | ORAL_TABLET | ORAL | Status: DC
  Administered 2018-05-18: 2 via ORAL

## 2018-05-18 MED ORDER — BENZOCAINE-MENTHOL 20-0.5 % EX AERO: 1.0000 "application " | INHALATION_SPRAY | CUTANEOUS | Status: DC | PRN

## 2018-05-18 MED ORDER — SIMETHICONE 80 MG PO CHEW: 80.0000 mg | CHEWABLE_TABLET | ORAL | Status: DC | PRN

## 2018-05-18 MED ORDER — DIBUCAINE 1 % RE OINT: 1.0000 "application " | TOPICAL_OINTMENT | RECTAL | Status: DC | PRN

## 2018-05-18 MED ORDER — DIPHENHYDRAMINE HCL 25 MG PO CAPS: 25.0000 mg | ORAL_CAPSULE | Freq: Four times a day (QID) | ORAL | Status: DC | PRN

## 2018-05-18 MED ORDER — ONDANSETRON HCL 4 MG PO TABS: 4.0000 mg | ORAL_TABLET | ORAL | Status: DC | PRN

## 2018-05-18 MED ORDER — ACETAMINOPHEN 325 MG PO TABS
650.0000 mg | ORAL_TABLET | ORAL | Status: DC | PRN
  Administered 2018-05-18: 650 mg via ORAL
  Filled 2018-05-18: qty 2

## 2018-05-18 MED ORDER — NIFEDIPINE ER OSMOTIC RELEASE 30 MG PO TB24
30.0000 mg | ORAL_TABLET | Freq: Every day | ORAL | Status: DC
  Administered 2018-05-18 – 2018-05-19 (×2): 30 mg via ORAL
  Filled 2018-05-18 (×2): qty 1

## 2018-05-18 MED ORDER — ZOLPIDEM TARTRATE 5 MG PO TABS: 5.0000 mg | ORAL_TABLET | Freq: Every evening | ORAL | Status: DC | PRN

## 2018-05-18 MED ORDER — TETANUS-DIPHTH-ACELL PERTUSSIS 5-2.5-18.5 LF-MCG/0.5 IM SUSP: 0.5000 mL | Freq: Once | INTRAMUSCULAR | Status: DC

## 2018-05-18 MED ORDER — PRENATAL MULTIVITAMIN CH
1.0000 | ORAL_TABLET | Freq: Every day | ORAL | Status: DC
  Administered 2018-05-18: 1 via ORAL
  Filled 2018-05-18: qty 1

## 2018-05-18 NOTE — Anesthesia Postprocedure Evaluation (Signed)
Anesthesia Post Note  Patient: Cynthia Crawford  Procedure(s) Performed: AN AD HOC LABOR EPIDURAL     Patient location during evaluation: Mother Baby Anesthesia Type: Epidural Level of consciousness: awake and alert Pain management: pain level controlled Vital Signs Assessment: post-procedure vital signs reviewed and stable Respiratory status: spontaneous breathing Cardiovascular status: blood pressure returned to baseline Postop Assessment: no headache, no backache, epidural receding, adequate PO intake, able to ambulate, no apparent nausea or vomiting and patient able to bend at knees Anesthetic complications: no    Last Vitals:  Vitals:   05/18/18 0233 05/18/18 0622  BP: 140/84 130/79  Pulse: 84 65  Resp: 20 20  Temp: 36.5 C   SpO2: 100% 98%    Last Pain:  Vitals:   05/18/18 0730  TempSrc:   PainSc: 2    Pain Goal: Patients Stated Pain Goal: 2 (05/18/18 0730)               Martavis Gurney

## 2018-05-18 NOTE — Progress Notes (Signed)
POSTPARTUM PROGRESS NOTE  Post Partum Day 1 Subjective:  Cynthia Crawford is a 27 y.o. [id number] [redacted]w[redacted]d s/p NSVD after IOL for cHTN.  No acute events overnight.  Pt denies problems with ambulating, voiding or po intake.  She denies nausea or vomiting.  Pain is well controlled. Lochia Minimal.   Objective: Blood pressure 130/79, pulse 65, temperature 97.7 F (36.5 C), temperature source Oral, resp. rate 20, height 5\' 4"  (1.626 m), weight 80.2 kg, last menstrual period 08/20/2017, SpO2 98 %  Physical Exam:  General: alert, cooperative and no distress Chest: no respiratory distress Uterine Fundus: firm, appropriately tender DVT Evaluation: No calf swelling or tenderness Extremities: no edema  Recent Labs    05/17/18 0813 05/17/18 1849  HGB 12.7 12.1  HCT 37.4 36.0    Assessment/Plan:  ASSESSMENT: Cynthia Crawford is a 27 y.o. [id number] [redacted]w[redacted]d s/p NSVD after IOL for cHTN.   - Feeding: breast - Circ needed before discharge - Contraception: undecided    LOS: 1 day   Raynelle Highland MS3 05/18/2018, 7:31 AM

## 2018-05-19 ENCOUNTER — Ambulatory Visit: Payer: Self-pay

## 2018-05-19 MED ORDER — NIFEDIPINE ER 30 MG PO TB24: 30.0000 mg | ORAL_TABLET | Freq: Every day | ORAL | 2 refills | Status: DC

## 2018-05-19 MED ORDER — IBUPROFEN 600 MG PO TABS: 600.0000 mg | ORAL_TABLET | Freq: Four times a day (QID) | ORAL | 0 refills | Status: DC

## 2018-05-19 NOTE — Lactation Note (Signed)
This note was copied from a baby's chart. Lactation Consultation Note  Patient Name: Cynthia Crawford EBXID'H Date: 05/19/2018 Reason for consult: Follow-up assessment   P2, Baby 39 hours old and on phototherapy. Mother trying to latch sleepy baby but mother is cramping too much so she went to bathroom. Discussed plan of breastfeeding, pumping and supplementing. Mother has personal DEBP at home.  Recommend continuing to wake baby for feedings as needed. Mom encouraged to feed baby 8-12 times/24 hours and with feeding cues.     Maternal Data    Feeding Feeding Type: Breast Fed  LATCH Score                   Interventions Interventions: DEBP  Lactation Tools Discussed/Used     Consult Status Consult Status: Complete    Hardie Pulley 05/19/2018, 3:08 PM

## 2018-05-19 NOTE — Discharge Summary (Signed)
OB Discharge Summary     Patient Name: Cynthia Crawford DOB: [birth year] MRN: [id number]  Date of admission: 05/17/2018 Delivering MD: Cam Hai D   Date of discharge: 05/19/2018  Admitting diagnosis: induction 38wks Intrauterine pregnancy: [redacted]w[redacted]d     Secondary diagnosis:  Active Problems:   Labor and delivery indication for care or intervention  Additional problems: None     Discharge diagnosis: Term Pregnancy Delivered                                                                                                Post partum procedures:none  Augmentation: Pitocin  Complications: None  Hospital course:  Induction of Labor With Vaginal Delivery   27 y.o. yo [id number] at [redacted]w[redacted]d was admitted to the hospital 05/17/2018 for induction of labor.  Indication for induction: CHTN.  Patient had an uncomplicated labor course as follows: Membrane Rupture Time/Date: 10:02 PM ,05/17/2018   Intrapartum Procedures: Episiotomy: None [1]                                         Lacerations:  None [1]  Patient had delivery of a Viable infant.  Information for the patient's newborn:  Shunda, Rabadi [[id number]]  Delivery Method: Vaginal, Spontaneous(Filed from Delivery Summary)   05/17/2018  Details of delivery can be found in separate delivery note.  Patient had a routine postpartum course. Patient is discharged home 05/19/18.  Physical exam  Vitals:   05/18/18 1504 05/18/18 2200 05/18/18 2229 05/19/18 0639  BP: (!) 144/90 137/80 137/80 134/87  Pulse: 65  66 64  Resp: 20 18 18 18   Temp: 97.9 F (36.6 C) 98.1 F (36.7 C)  98.4 F (36.9 C)  TempSrc: Oral Oral  Oral  SpO2: 100%     Weight:      Height:       General: alert, cooperative and no distress Lochia: appropriate Uterine Fundus: firm Incision: N/A DVT Evaluation: No evidence of DVT seen on physical exam. Labs: Lab Results  Component Value Date   WBC 16.1 (H) 05/17/2018   HGB 12.1 05/17/2018   HCT 36.0  05/17/2018   MCV 92.1 05/17/2018   PLT 140 (L) 05/17/2018   CMP Latest Ref Rng & Units 05/12/2018  Glucose 65 - 99 mg/dL 98  BUN 6 - 20 mg/dL 6  Creatinine 1.32 - 4.40 mg/dL 1.02  Sodium 725 - 366 mmol/L 137  Potassium 3.5 - 5.2 mmol/L 3.9  Chloride 96 - 106 mmol/L 103  CO2 20 - 29 mmol/L 20  Calcium 8.7 - 10.2 mg/dL 4.4(I)  Total Protein 6.0 - 8.5 g/dL 6.0  Total Bilirubin 0.0 - 1.2 mg/dL <3.4  Alkaline Phos 39 - 117 IU/L 84  AST 0 - 40 IU/L 20  ALT 0 - 32 IU/L 15    Discharge instruction: per After Visit Summary and "Baby and Me Booklet".  After visit meds:  Allergies as of 05/19/2018      Reactions   Penicillins Hives  Has patient had a PCN reaction causing immediate rash, facial/tongue/throat swelling, SOB or lightheadedness with hypotension: Yes Has patient had a PCN reaction causing severe rash involving mucus membranes or skin necrosis: No Has patient had a PCN reaction that required hospitalization No Has patient had a PCN reaction occurring within the last 10 years: No If all of the above answers are "NO", then may proceed with Cephalosporin use.   Sulfa Antibiotics Hives      Medication List    STOP taking these medications   aspirin EC 81 MG tablet     TAKE these medications   acetaminophen 500 MG tablet Commonly known as:  TYLENOL Take 1,000 mg by mouth every 6 (six) hours as needed for headache.   calcium carbonate 500 MG chewable tablet Commonly known as:  TUMS - dosed in mg elemental calcium Chew 1 tablet by mouth daily as needed for indigestion or heartburn.   ibuprofen 600 MG tablet Commonly known as:  ADVIL,MOTRIN Take 1 tablet (600 mg total) by mouth every 6 (six) hours.   NIFEdipine 30 MG 24 hr tablet Commonly known as:  PROCARDIA-XL/ADALAT CC Take 1 tablet (30 mg total) by mouth daily.   prenatal multivitamin Tabs tablet Take 1 tablet by mouth at bedtime.       Diet: routine diet  Activity: Advance as tolerated. Pelvic rest for 6  weeks.   Outpatient follow up:1 week for BP check Follow up Appt: Future Appointments  Date Time Provider Department Center  05/30/2018  9:30 AM [facility] NURSE [facility]  06/19/2018  9:30 AM Anyanwu, Jethro Bastos, MD [facility]   Follow up Visit:No follow-ups on file.  Postpartum contraception: Progesterone only pills, discussed LARCs as most effective, safe for breastfeeding  Newborn Data: Live born female  Birth Weight: 7 lb 0.5 oz (3190 g) APGAR: 8, 9  Newborn Delivery   Birth date/time:  05/17/2018 23:24:00 Delivery type:  Vaginal, Spontaneous     Baby Feeding: Breast Disposition:home with mother   05/19/2018 Sharen Counter, CNM

## 2018-05-19 NOTE — Lactation Note (Addendum)
This note was copied from a baby's chart. Lactation Consultation Note  Patient Name: Cynthia Crawford WFUXN'A Date: 05/19/2018 Reason for consult: Initial assessment;Early term 37-38.6wks P2, 27 hour female infant ,ETI, photo therapy, DAT+ Per mom, BF her daughter for 10 months. Mom had infant under bili lights did not want take infant out BF at this time.  Per mom, BF going well but she is unsure if he is getting colostrum not really hearing swallows. Per mom, her daughter was born early as well and she BF and supplemented until her milk came in. Per mom, she hand express but was not seeing colostrum, LC assisted in hand expression only smear colostrum present. Parents wants  to BF and supplement w/ formula  at this time until mom milk supply increases. Parents  informed of formula risk. LC assisted dad in feeding infant with curve tip syringe, infant given 10 ml based on age/ hours of Similac Advance 19kcal w/ iron. Mom plans to BF 20 minutes or less, then give infant any EBM, afterwards supplement w/ formula. LC discussed infant behaviors and feeding guide for ETI infant. Mom will feed infant according to cues, 8 to 12 times within 24 hours, will not exceed 3 hours w/ out feeding. Mom will use DEBP to  help with breast stimulation and induction. Mom will pump q3h for 15 -20 minutes on initial setting. Mom shown how to use DEBP & how to disassemble, clean, & reassemble parts. Reviewed Baby & Me book's Breastfeeding Basics.  Mom made aware of O/P services, breastfeeding support groups, community resources, and our phone # for post-discharge questions.  Maternal Data Formula Feeding for Exclusion: No Has patient been taught Hand Expression?: Yes Does the patient have breastfeeding experience prior to this delivery?: Yes(BF her daughter for 10 months)  Feeding Feeding Type: Formula Length of feed: 20 min  LATCH Score                   Interventions Interventions:  DEBP  Lactation Tools Discussed/Used WIC Program: No(Over income) Pump Review: Setup, frequency, and cleaning;Milk Storage Initiated by:: Danelle Earthly, IBCLC Date initiated:: 05/19/18   Consult Status      Cynthia Crawford 05/19/2018, 2:47 AM

## 2018-05-20 ENCOUNTER — Ambulatory Visit: Payer: Self-pay

## 2018-05-20 ENCOUNTER — Inpatient Hospital Stay (HOSPITAL_COMMUNITY): Admission: RE | Admit: 2018-05-20 | Payer: 59 | Source: Ambulatory Visit

## 2018-05-20 NOTE — Lactation Note (Addendum)
This note was copied from a baby's chart. Lactation Consultation Note  Patient Name: Cynthia Crawford [id number]'T Date: 05/20/2018 Reason for consult: Follow-up assessment;Hyperbilirubinemia  Visited with P2 Mom of ET baby at 58 hrs old.  Baby will have home phototherapy for one night, and Ped appointment tomorrow.  Mom breastfeeding baby in cradle hold, with baby wrapped in blanket.  Mom states baby is breastfeeding well.  Encouraged her to unwrap baby and keep him STS when breastfeeding.  Encouraged alternate breast compression to increase milk transfer.  Mom has a DEBP at home, encouraged post breast feeding double pumping to stimulate her milk supply, and offer baby her EBM.  Talked about normal sleepiness with jaundice.   Reviewed engorgement prevention and treatment.  Encouraged STS and feeding baby often on cue.  Goal of 8-12 feedings per 24 hrs.  Mom aware of OP lactation support available to her.  Encouraged to call prn.  Consult Status Consult Status: Complete Date: 05/20/18 Follow-up type: Call as needed    Judee Clara 05/20/2018, 1:01 PM

## 2018-05-27 ENCOUNTER — Inpatient Hospital Stay (HOSPITAL_COMMUNITY): Admission: AD | Admit: 2018-05-27 | Payer: 59 | Source: Ambulatory Visit | Admitting: Obstetrics and Gynecology

## 2018-05-30 ENCOUNTER — Ambulatory Visit: Payer: 59

## 2018-05-30 VITALS — BP 144/92 | HR 70 | Wt 153.2 lb

## 2018-05-30 DIAGNOSIS — Z0130 Encounter for examination of blood pressure without abnormal findings: Secondary | ICD-10-CM

## 2018-05-30 NOTE — Progress Notes (Signed)
Subjective:  Cynthia Crawford is a 27 y.o. female here for BP check.   Hypertension ROS: No swelling or headache at this time. Patient reports she stop taking procardia due to headache and swelling. She is now taking Baby Asprin.     Objective:  BP (!) 144/92   Pulse 70   Wt 153 lb 3.2 oz (69.5 kg)   BMI 26.30 kg/m   Appearance appearance:alert, well appearing, and in no distress  General exam BP noted to be well controlled today in office.    Assessment:   Blood Pressure 144/62.   Plan:  Per Dr.Pickens follow up in one week for blood pressure check.

## 2018-06-06 ENCOUNTER — Ambulatory Visit: Payer: 59

## 2018-06-06 VITALS — BP 138/93

## 2018-06-06 DIAGNOSIS — Z0130 Encounter for examination of blood pressure without abnormal findings: Secondary | ICD-10-CM

## 2018-06-06 NOTE — Progress Notes (Signed)
Subjective:  Cynthia Crawford is a 27 y.o. female here for BP check.   Hypertension ROS: Patient is currently taken aspirin for blood pressure since the procardia made her feels horrible. She reports no swelling, no headache or sob at this time.     Objective:  BP (!) 138/93   Appearance alert, well appearing, and in no distress at this time.  General exam BP noted to be well controlled today in office.   Assessment: 138/93  Blood Pressure is sightly elevated at this time however patient feels fine..  Plan:  Patient will continued to take Asprin once a day and monitor her blood pressure at home. Patient to follow up in one week for blood pressure check.

## 2018-06-09 NOTE — Progress Notes (Signed)
I have reviewed the chart and agree with nursing staff's documentation of this patient's encounter.  Kemonte Ullman, MD 06/09/2018 11:22 AM    

## 2018-06-10 ENCOUNTER — Encounter: Payer: Self-pay | Admitting: Family Medicine

## 2018-06-10 ENCOUNTER — Ambulatory Visit: Payer: 59 | Admitting: Family Medicine

## 2018-06-10 VITALS — BP 140/94 | HR 69 | Temp 98.2°F | Wt 151.6 lb

## 2018-06-10 DIAGNOSIS — I10 Essential (primary) hypertension: Secondary | ICD-10-CM | POA: Diagnosis not present

## 2018-06-10 DIAGNOSIS — Z23 Encounter for immunization: Secondary | ICD-10-CM

## 2018-06-10 MED ORDER — AMLODIPINE BESYLATE 5 MG PO TABS: 5.0000 mg | ORAL_TABLET | Freq: Every day | ORAL | 3 refills | Status: DC

## 2018-06-10 NOTE — Assessment & Plan Note (Signed)
Uncontrolled Was previously on enalapril, but currently breastfeeding Has not tolerated nifedipine due to headaches Will switch to amlodipine No signs of preeclampsia Recent HELLP labs wnl (except for stable, mild thrombocytopenia) No protein in urine today Discussed possible side effects of amlodipine including LE edema F/u in 1 month

## 2018-06-10 NOTE — Addendum Note (Signed)
Addended by: Sherre PootWALSTON, Ann[place] Nellums N on: 06/10/2018 01:19 PM   Modules accepted: Orders

## 2018-06-10 NOTE — Progress Notes (Signed)
Patient: Cynthia JarvisChasity Hargrave Crawford Female    DOB: 06-30-1991   27 y.o.   MRN: [id number] Visit Date: 06/10/2018  Today's Provider: Shirlee LatchAngela Jaslin Novitski, MD   Chief Complaint  Patient presents with  . Hypertension   Subjective:    I, Cynthia RaddleNikki Crawford, CMA, am acting as a Neurosurgeonscribe for Cynthia LatchAngela Teneil Shiller, MD.   Hypertension  This is a new problem. Episode onset: during pregnancy. The problem is uncontrolled. Associated symptoms include headaches (dull). Pertinent negatives include no anxiety, blurred vision, chest pain, malaise/fatigue, neck pain, orthopnea, palpitations, peripheral edema, PND, shortness of breath or sweats. Risk factors: recent pregnancy. Treatments tried: procardia. The current treatment provides no improvement (patient states she had to stop taking medication due to increase headaches).     Allergies  Allergen Reactions  . Penicillins Hives    Has patient had a PCN reaction causing immediate rash, facial/tongue/throat swelling, SOB or lightheadedness with hypotension: Yes Has patient had a PCN reaction causing severe rash involving mucus membranes or skin necrosis: No Has patient had a PCN reaction that required hospitalization No Has patient had a PCN reaction occurring within the last 10 years: No If all of the above answers are "NO", then may proceed with Cephalosporin use.  . Sulfa Antibiotics Hives     Current Outpatient Medications:  .  acetaminophen (TYLENOL) 500 MG tablet, Take 1,000 mg by mouth every 6 (six) hours as needed for headache., Disp: , Rfl:  .  aspirin 81 MG tablet, Take 81 mg by mouth daily., Disp: , Rfl:  .  Prenatal Vit-Fe Fumarate-FA (PRENATAL MULTIVITAMIN) TABS tablet, Take 1 tablet by mouth at bedtime., Disp: , Rfl:  .  NIFEdipine (PROCARDIA-XL/ADALAT CC) 30 MG 24 hr tablet, Take 1 tablet (30 mg total) by mouth daily. (Patient not taking: Reported on 06/06/2018), Disp: 30 tablet, Rfl: 2  Review of Systems  Constitutional: Negative.  Negative  for malaise/fatigue.  Eyes: Negative for blurred vision.  Respiratory: Negative.  Negative for shortness of breath.   Cardiovascular: Negative for chest pain, palpitations, orthopnea, leg swelling and PND.  Musculoskeletal: Negative for neck pain.  Neurological: Positive for headaches (dull).    Social History   Tobacco Use  . Smoking status: Never Smoker  . Smokeless tobacco: Never Used  Substance Use Topics  . Alcohol use: No   Objective:   BP (!) 140/94 (BP Location: Right Arm, Patient Position: Sitting, Cuff Size: Normal)   Pulse 69   Temp 98.2 F (36.8 C) (Oral)   Wt 151 lb 9.6 oz (68.8 kg)   SpO2 99%   BMI 26.02 kg/m  Vitals:   06/10/18 1122  BP: (!) 140/94  Pulse: 69  Temp: 98.2 F (36.8 C)  TempSrc: Oral  SpO2: 99%  Weight: 151 lb 9.6 oz (68.8 kg)     Physical Exam  Constitutional: She is oriented to person, place, and time. She appears well-developed and well-nourished. No distress.  HENT:  Head: Normocephalic and atraumatic.  Eyes: Conjunctivae are normal.  Neck: Neck supple. No thyromegaly present.  Cardiovascular: Normal rate, regular rhythm, normal heart sounds and intact distal pulses.  No murmur heard. Pulmonary/Chest: Effort normal and breath sounds normal. No respiratory distress. She has no wheezes. She has no rales.  Abdominal: Soft. She exhibits no distension. There is no tenderness.  Musculoskeletal: She exhibits no edema.  Lymphadenopathy:    She has no cervical adenopathy.  Neurological: She is alert and oriented to person, place, and time.  Skin: Skin  is warm and dry. Capillary refill takes less than 2 seconds. No rash noted.  Psychiatric: She has a normal mood and affect. Her behavior is normal.  Vitals reviewed.       Assessment & Plan:   Problem List Items Addressed This Visit      Cardiovascular and Mediastinum   Essential hypertension - Primary    Uncontrolled Was previously on enalapril, but currently breastfeeding Has  not tolerated nifedipine due to headaches Will switch to amlodipine No signs of preeclampsia Recent HELLP labs wnl (except for stable, mild thrombocytopenia) No protein in urine today Discussed possible side effects of amlodipine including LE edema F/u in 1 month      Relevant Medications   aspirin 81 MG tablet   amLODipine (NORVASC) 5 MG tablet       Return in about 4 weeks (around 07/08/2018) for BP f/u.   The entirety of the information documented in the History of Present Illness, Review of Systems and Physical Exam were personally obtained by me. Portions of this information were initially documented by Cynthia Crawford, CMA and reviewed by me for thoroughness and accuracy.    Cynthia Downer, MD, MPH [facility] 06/10/2018 11:54 AM

## 2018-06-10 NOTE — Patient Instructions (Signed)
Stop Nifedipine Start amlodipine 5 mg daily Let me know if you develop swelling

## 2018-06-17 NOTE — Progress Notes (Signed)
Post Partum Exam  Cynthia Crawford is a 27 y.o. G21P2002 female who presents for a postpartum visit. She is postpartum following a vaginal delivery: 05/17/2018.  I have fully reviewed the prenatal and intrapartum course, patient had CHTN for which she is taking Amlodipine currently. The delivery was at  gestational weeks.  Anesthesia: Epidual. Postpartum course has been uncomplicated. Baby's course has been uncomplicated. Baby is feeding by breast. Bleeding not at this time. Bowel function is normal. Bladder function is normal. Patient is not sexually active. Contraception method is nothing at this time, desires natural family planning. Postpartum depression screening:negative  The following portions of the patient's history were reviewed and updated as appropriate: allergies, current medications, past family history, past medical history, past social history, past surgical history and problem list. Last pap smear done 10/25/2016 normal  Review of Systems Pertinent items noted in HPI and remainder of comprehensive ROS otherwise negative.    Objective:  Blood pressure 138/88, pulse 60, weight 150 lb (68 kg), unknown if currently breastfeeding.  General:  alert and no distress   Breasts:  inspection negative, no nipple discharge or bleeding, no masses or nodularity palpable  Lungs: clear to auscultation bilaterally  Heart:  regular rate and rhythm  Abdomen: soft, non-tender; bowel sounds normal; no masses,  no organomegaly   Pelvic:  not evaluated        Assessment and Plan:  1. Essential hypertension Continue Amlodipine and follow up with PCP  2. Postpartum care following vaginal delivery No concerns. Contraception: rhythm method  Return for any gynecologic concerns.  Jaynie Collins, MD, FACOG Obstetrician & Gynecologist, [facility] for [employer], [facility]

## 2018-06-19 ENCOUNTER — Encounter: Payer: Self-pay | Admitting: Obstetrics & Gynecology

## 2018-06-19 ENCOUNTER — Ambulatory Visit (INDEPENDENT_AMBULATORY_CARE_PROVIDER_SITE_OTHER): Payer: 59 | Admitting: Obstetrics & Gynecology

## 2018-06-19 DIAGNOSIS — I10 Essential (primary) hypertension: Secondary | ICD-10-CM

## 2018-06-19 NOTE — Patient Instructions (Signed)
Return to clinic for any scheduled appointments or for any gynecologic concerns as needed.   

## 2018-07-09 ENCOUNTER — Ambulatory Visit: Payer: 59 | Admitting: Family Medicine

## 2018-07-09 ENCOUNTER — Encounter: Payer: Self-pay | Admitting: Family Medicine

## 2018-07-09 VITALS — BP 124/86 | HR 76 | Temp 98.6°F | Resp 16 | Wt 149.0 lb

## 2018-07-09 DIAGNOSIS — I10 Essential (primary) hypertension: Secondary | ICD-10-CM

## 2018-07-09 MED ORDER — AMLODIPINE BESYLATE 5 MG PO TABS: 5.0000 mg | ORAL_TABLET | Freq: Every day | ORAL | 3 refills | Status: DC

## 2018-07-09 NOTE — Patient Instructions (Signed)
DASH Eating Plan DASH stands for "Dietary Approaches to Stop Hypertension." The DASH eating plan is a healthy eating plan that has been shown to reduce high blood pressure (hypertension). It may also reduce your risk for type 2 diabetes, heart disease, and stroke. The DASH eating plan may also help with weight loss. What are tips for following this plan? General guidelines  Avoid eating more than 2,300 mg (milligrams) of salt (sodium) a day. If you have hypertension, you may need to reduce your sodium intake to 1,500 mg a day.  Limit alcohol intake to no more than 1 drink a day for nonpregnant women and 2 drinks a day for men. One drink equals 12 oz of beer, 5 oz of wine, or 1 oz of hard liquor.  Work with your health care provider to maintain a healthy body weight or to lose weight. Ask what an ideal weight is for you.  Get at least 30 minutes of exercise that causes your heart to beat faster (aerobic exercise) most days of the week. Activities may include walking, swimming, or biking.  Work with your health care provider or diet and nutrition specialist (dietitian) to adjust your eating plan to your individual calorie needs. Reading food labels  Check food labels for the amount of sodium per serving. Choose foods with less than 5 percent of the Daily Value of sodium. Generally, foods with less than 300 mg of sodium per serving fit into this eating plan.  To find whole grains, look for the word "whole" as the first word in the ingredient list. Shopping  Buy products labeled as "low-sodium" or "no salt added."  Buy fresh foods. Avoid canned foods and premade or frozen meals. Cooking  Avoid adding salt when cooking. Use salt-free seasonings or herbs instead of table salt or sea salt. Check with your health care provider or pharmacist before using salt substitutes.  Do not fry foods. Cook foods using healthy methods such as baking, boiling, grilling, and broiling instead.  Cook with  heart-healthy oils, such as olive, canola, soybean, or sunflower oil. Meal planning   Eat a balanced diet that includes: ? 5 or more servings of fruits and vegetables each day. At each meal, try to fill half of your plate with fruits and vegetables. ? Up to 6-8 servings of whole grains each day. ? Less than 6 oz of lean meat, poultry, or fish each day. A 3-oz serving of meat is about the same size as a deck of cards. One egg equals 1 oz. ? 2 servings of low-fat dairy each day. ? A serving of nuts, seeds, or beans 5 times each week. ? Heart-healthy fats. Healthy fats called Omega-3 fatty acids are found in foods such as flaxseeds and coldwater fish, like sardines, salmon, and mackerel.  Limit how much you eat of the following: ? Canned or prepackaged foods. ? Food that is high in trans fat, such as fried foods. ? Food that is high in saturated fat, such as fatty meat. ? Sweets, desserts, sugary drinks, and other foods with added sugar. ? Full-fat dairy products.  Do not salt foods before eating.  Try to eat at least 2 vegetarian meals each week.  Eat more home-cooked food and less restaurant, buffet, and fast food.  When eating at a restaurant, ask that your food be prepared with less salt or no salt, if possible. What foods are recommended? The items listed may not be a complete list. Talk with your dietitian about what   dietary choices are best for you. Grains Whole-grain or whole-wheat bread. Whole-grain or whole-wheat pasta. Brown rice. Oatmeal. Quinoa. Bulgur. Whole-grain and low-sodium cereals. Pita bread. Low-fat, low-sodium crackers. Whole-wheat flour tortillas. Vegetables Fresh or frozen vegetables (raw, steamed, roasted, or grilled). Low-sodium or reduced-sodium tomato and vegetable juice. Low-sodium or reduced-sodium tomato sauce and tomato paste. Low-sodium or reduced-sodium canned vegetables. Fruits All fresh, dried, or frozen fruit. Canned fruit in natural juice (without  added sugar). Meat and other protein foods Skinless chicken or turkey. Ground chicken or turkey. Pork with fat trimmed off. Fish and seafood. Egg whites. Dried beans, peas, or lentils. Unsalted nuts, nut butters, and seeds. Unsalted canned beans. Lean cuts of beef with fat trimmed off. Low-sodium, lean deli meat. Dairy Low-fat (1%) or fat-free (skim) milk. Fat-free, low-fat, or reduced-fat cheeses. Nonfat, low-sodium ricotta or cottage cheese. Low-fat or nonfat yogurt. Low-fat, low-sodium cheese. Fats and oils Soft margarine without trans fats. Vegetable oil. Low-fat, reduced-fat, or light mayonnaise and salad dressings (reduced-sodium). Canola, safflower, olive, soybean, and sunflower oils. Avocado. Seasoning and other foods Herbs. Spices. Seasoning mixes without salt. Unsalted popcorn and pretzels. Fat-free sweets. What foods are not recommended? The items listed may not be a complete list. Talk with your dietitian about what dietary choices are best for you. Grains Baked goods made with fat, such as croissants, muffins, or some breads. Dry pasta or rice meal packs. Vegetables Creamed or fried vegetables. Vegetables in a cheese sauce. Regular canned vegetables (not low-sodium or reduced-sodium). Regular canned tomato sauce and paste (not low-sodium or reduced-sodium). Regular tomato and vegetable juice (not low-sodium or reduced-sodium). Pickles. Olives. Fruits Canned fruit in a light or heavy syrup. Fried fruit. Fruit in cream or butter sauce. Meat and other protein foods Fatty cuts of meat. Ribs. Fried meat. Bacon. Sausage. Bologna and other processed lunch meats. Salami. Fatback. Hotdogs. Bratwurst. Salted nuts and seeds. Canned beans with added salt. Canned or smoked fish. Whole eggs or egg yolks. Chicken or turkey with skin. Dairy Whole or 2% milk, cream, and half-and-half. Whole or full-fat cream cheese. Whole-fat or sweetened yogurt. Full-fat cheese. Nondairy creamers. Whipped toppings.  Processed cheese and cheese spreads. Fats and oils Butter. Stick margarine. Lard. Shortening. Ghee. Bacon fat. Tropical oils, such as coconut, palm kernel, or palm oil. Seasoning and other foods Salted popcorn and pretzels. Onion salt, garlic salt, seasoned salt, table salt, and sea salt. Worcestershire sauce. Tartar sauce. Barbecue sauce. Teriyaki sauce. Soy sauce, including reduced-sodium. Steak sauce. Canned and packaged gravies. Fish sauce. Oyster sauce. Cocktail sauce. Horseradish that you find on the shelf. Ketchup. Mustard. Meat flavorings and tenderizers. Bouillon cubes. Hot sauce and Tabasco sauce. Premade or packaged marinades. Premade or packaged taco seasonings. Relishes. Regular salad dressings. Where to find more information:  National Heart, Lung, and Blood Institute: www.nhlbi.nih.gov  American Heart Association: www.heart.org Summary  The DASH eating plan is a healthy eating plan that has been shown to reduce high blood pressure (hypertension). It may also reduce your risk for type 2 diabetes, heart disease, and stroke.  With the DASH eating plan, you should limit salt (sodium) intake to 2,300 mg a day. If you have hypertension, you may need to reduce your sodium intake to 1,500 mg a day.  When on the DASH eating plan, aim to eat more fresh fruits and vegetables, whole grains, lean proteins, low-fat dairy, and heart-healthy fats.  Work with your health care provider or diet and nutrition specialist (dietitian) to adjust your eating plan to your individual   calorie needs. This information is not intended to replace advice given to you by your health care provider. Make sure you discuss any questions you have with your health care provider. Document Released: 08/23/2011 Document Revised: 08/27/2016 Document Reviewed: 08/27/2016 Elsevier Interactive Patient Education  2018 Elsevier Inc.  

## 2018-07-09 NOTE — Progress Notes (Signed)
Patient: Cynthia Crawford Female    DOB: 1991-07-03   27 y.o.   MRN: [id number] Visit Date: 07/09/2018  Today's Provider: Shirlee Latch, MD   Chief Complaint  Patient presents with  . Hypertension    One month follow up   Subjective:    Hypertension  Associated symptoms include peripheral edema (Pt reports her feet where swollen last night.). Pertinent negatives include no anxiety, blurred vision, chest pain, headaches, malaise/fatigue, neck pain, orthopnea, palpitations, PND, shortness of breath or sweats. There are no associated agents to hypertension. There are no compliance problems.        Allergies  Allergen Reactions  . Penicillins Hives    Has patient had a PCN reaction causing immediate rash, facial/tongue/throat swelling, SOB or lightheadedness with hypotension: Yes Has patient had a PCN reaction causing severe rash involving mucus membranes or skin necrosis: No Has patient had a PCN reaction that required hospitalization No Has patient had a PCN reaction occurring within the last 10 years: No If all of the above answers are "NO", then may proceed with Cephalosporin use.  . Sulfa Antibiotics Hives     Current Outpatient Medications:  .  acetaminophen (TYLENOL) 500 MG tablet, Take 1,000 mg by mouth every 6 (six) hours as needed for headache., Disp: , Rfl:  .  amLODipine (NORVASC) 5 MG tablet, Take 1 tablet (5 mg total) by mouth daily., Disp: 90 tablet, Rfl: 3 .  aspirin 81 MG tablet, Take 81 mg by mouth daily., Disp: , Rfl:  .  Prenatal Vit-Fe Fumarate-FA (PRENATAL MULTIVITAMIN) TABS tablet, Take 1 tablet by mouth at bedtime., Disp: , Rfl:   Review of Systems  Constitutional: Negative.  Negative for malaise/fatigue.  Eyes: Negative for blurred vision.  Respiratory: Negative.  Negative for shortness of breath.   Cardiovascular: Negative.  Negative for chest pain, palpitations, orthopnea and PND.  Gastrointestinal: Negative.   Musculoskeletal:  Negative for neck pain.  Neurological: Negative for dizziness, light-headedness and headaches.    Social History   Tobacco Use  . Smoking status: Never Smoker  . Smokeless tobacco: Never Used  Substance Use Topics  . Alcohol use: No   Objective:   BP 124/86 (BP Location: Left Arm, Patient Position: Sitting, Cuff Size: Normal)   Pulse 76   Temp 98.6 F (37 C) (Oral)   Resp 16   Wt 149 lb (67.6 kg)   LMP 07/06/2018   Breastfeeding? Yes   BMI 25.58 kg/m  Vitals:   07/09/18 1129  BP: 124/86  Pulse: 76  Resp: 16  Temp: 98.6 F (37 C)  TempSrc: Oral  Weight: 149 lb (67.6 kg)     Physical Exam  Constitutional: She is oriented to person, place, and time. She appears well-developed and well-nourished. No distress.  HENT:  Head: Normocephalic and atraumatic.  Eyes: Conjunctivae are normal. No scleral icterus.  Cardiovascular: Normal rate, regular rhythm, normal heart sounds and intact distal pulses.  No murmur heard. Pulmonary/Chest: Effort normal and breath sounds normal. No respiratory distress. She has no wheezes. She has no rales.  Musculoskeletal: She exhibits no edema.  Neurological: She is alert and oriented to person, place, and time.  Skin: Skin is warm and dry. Capillary refill takes less than 2 seconds. No rash noted.  Psychiatric: She has a normal mood and affect. Her behavior is normal.  Vitals reviewed.       Assessment & Plan:   Problem List Items Addressed This Visit  Cardiovascular and Mediastinum   Essential hypertension - Primary    Well-controlled Continue amlodipine 5 mg daily She is tolerating this well without any side effects As discussed previously, no signs of preeclampsia Follow-up in 6 months or sooner as needed      Relevant Medications   amLODipine (NORVASC) 5 MG tablet      Return in about 6 months (around 01/08/2019) for CPE.   The entirety of the information documented in the History of Present Illness, Review of  Systems and Physical Exam were personally obtained by me. Portions of this information were initially documented by Kavin Leech, CMA and reviewed by me for thoroughness and accuracy.    Erasmo Downer, MD, MPH [facility] 07/09/2018 11:55 AM

## 2018-07-09 NOTE — Assessment & Plan Note (Signed)
Well-controlled Continue amlodipine 5 mg daily She is tolerating this well without any side effects As discussed previously, no signs of preeclampsia Follow-up in 6 months or sooner as needed

## 2018-07-21 ENCOUNTER — Telehealth: Payer: Self-pay | Admitting: Family Medicine

## 2018-07-21 NOTE — Telephone Encounter (Signed)
Pt has recently started taking Amlodipine and she has developed itching and swelling on her lips.  She wants to know if it could be the new medication.  This has been going on about a week now.  Pt's CB # [phone]  Thanks  Barth Kirks

## 2018-07-21 NOTE — Telephone Encounter (Signed)
Yes it is possible.  Stop taking amlodipine and see if swelling and rash subsides.  Do we have a f/u appt sometime soon? Would advised OV to evaluate swelling and get started on a new BP med.  If develops any difficulty breathing/swallowing, needs to go to ED.  Erasmo Downer, MD, MPH [facility] 07/21/2018 4:38 PM

## 2018-07-22 NOTE — Telephone Encounter (Signed)
Patient was advised and schedule appointment for 08/05/2018 @ 9:20am.

## 2018-07-29 ENCOUNTER — Encounter: Payer: 59 | Admitting: Family Medicine

## 2018-07-30 IMAGING — US US MFM OB FOLLOW-UP
1 series · 13 of 28 positions shown · non-contrast
Comparison: none

AKASYA

1  KNARIK SOLANKI            [id number]      [id number]     [id number]
Indications
24 weeks gestation of pregnancy
Hypertension - Chronic/Pre-existing (ASA)
Fetal abnormality - other known or
suspected (Bilat pyelectasis); low risk NIPS
Encounter for other antenatal screening
follow-up
OB History
Blood Type:            Height:  5'4"   Weight (lb):  140       BMI:
Gravidity:    2         Term:   1        Prem:   0        SAB:   0
TOP:          0       Ectopic:  0        Living: 1
Fetal Evaluation
Num Of Fetuses:     1
Fetal Heart         149
Rate(bpm):
Cardiac Activity:   Observed
Presentation:       Transverse, head to maternal left
Placenta:           Posterior, above cervical os
P. Cord Insertion:  Previously Visualized
Amniotic Fluid
AFI FV:      Subjectively within normal limits
Largest Pocket(cm)
5.61
Biometry
BPD:      62.4  mm     G. Age:  25w 2d         78  %    CI:        75.34   %    70 - 86
FL/HC:      18.6   %    18.7 -
HC:       228   mm     G. Age:  24w 6d         52  %    HC/AC:      1.08        1.05 -
AC:      210.9  mm     G. Age:  25w 4d         80  %    FL/BPD:     67.9   %    71 - 87
FL:       42.4  mm     G. Age:  23w 6d         24  %    FL/AC:      20.1   %    20 - 24
HUM:      41.2  mm     G. Age:  25w 0d         57  %
Est. FW:     746  gm    1 lb 10 oz      64  %
Gestational Age
LMP:           24w 2d        Date:  08/20/17                 EDD:   05/27/18
U/S Today:     24w 6d                                        EDD:   05/23/18
Best:          24w 2d     Det. By:  LMP  (08/20/17)          EDD:   05/27/18
Anatomy
Cranium:               Appears normal         Aortic Arch:            Previously seen
Cavum:                 Appears normal         Ductal Arch:            Previously seen
Ventricles:            Appears normal         Diaphragm:              Previously seen
Choroid Plexus:        Appears normal         Stomach:                Appears normal, left
sided
Cerebellum:            Previously seen        Abdomen:                Appears normal
Posterior Fossa:       Previously seen        Abdominal Wall:         Previously seen
Nuchal Fold:           Previously seen        Cord Vessels:           Previously seen
Face:                  Orbits and profile     Kidneys:                Bilat
previously seen
pyelectasis,Rt
6mm, Lt 6mm
Lips:                  Previously seen        Bladder:                Appears normal
Thoracic:              Appears normal         Spine:                  Previously seen
Heart:                 Appears normal         Upper Extremities:      Previously seen
(4CH, axis, and situs
RVOT:                  Appears normal         Lower Extremities:      Previously seen
LVOT:                  Appears normal
Other:  Male gender. Heels and  RT 5th digit prev visualized.
Cervix Uterus Adnexa
Cervix
Length:           5.82  cm.
Normal appearance by transabdominal scan.
Uterus
No abnormality visualized.
Left Ovary
Size(cm)       3.1  x   1.6    x  1.6       Vol(ml):
Within normal limits.
Right Ovary
Size(cm)       3.8  x   2.5    x  2.2       Vol(ml):
Impression
INDICATION: 26 yr old [id number] at 80w8d with chronic
hypertension and previous finding of fetal choroid plexus cyst
and urinary tract dilation for fetal ultrasound.

[Series 1: us mfm ob follow-up · 59 acquisitions, 13 frames shown]
[im 3/59]
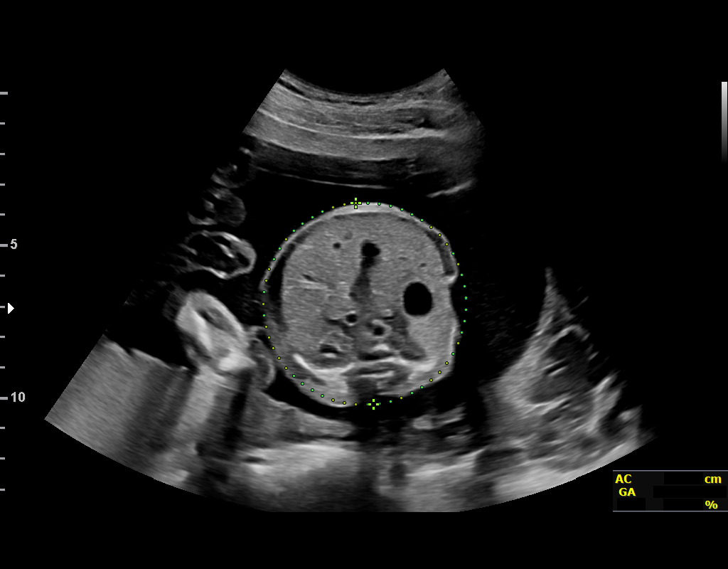
[im 7/59]
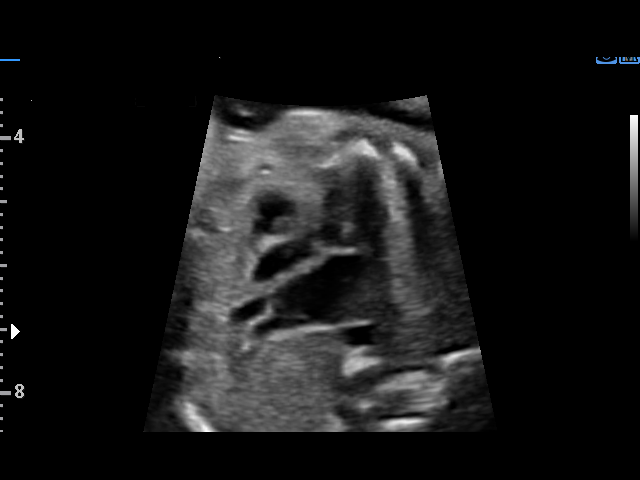
[im 11/59]
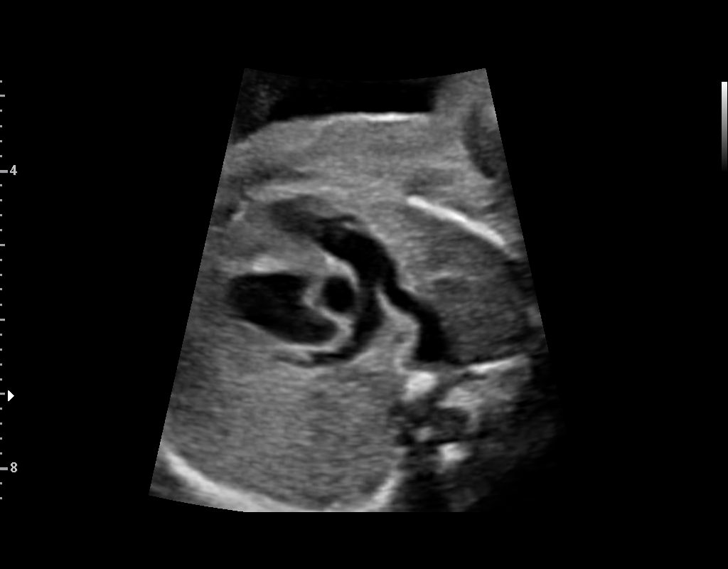
[im 16/59]
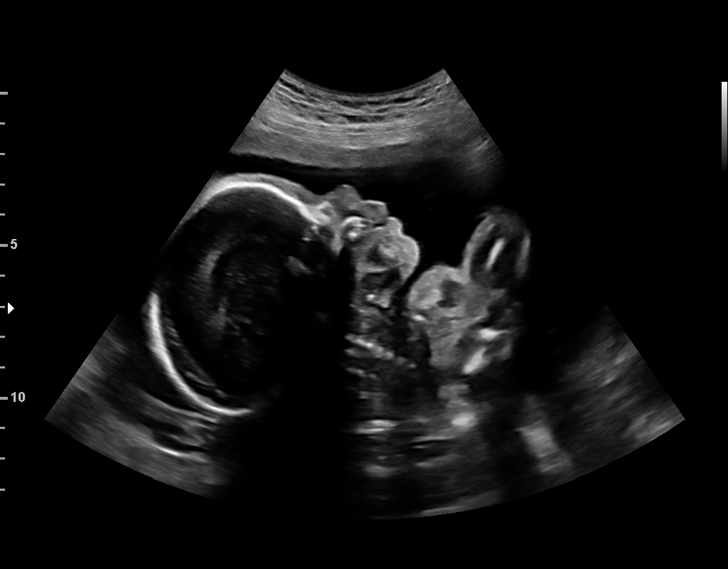
[im 20/59]
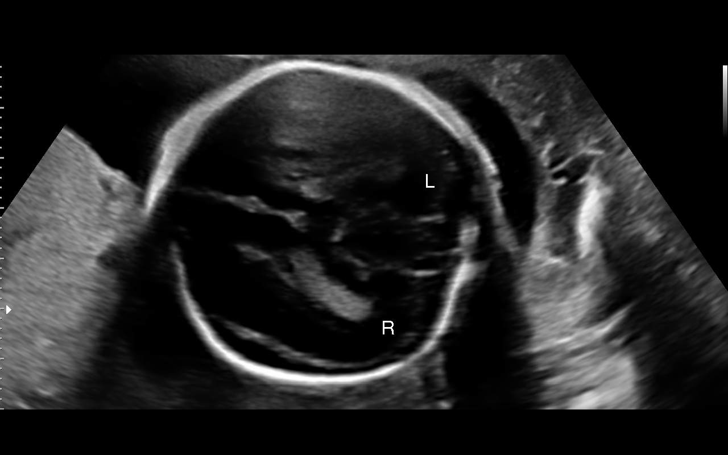
[im 24/59]
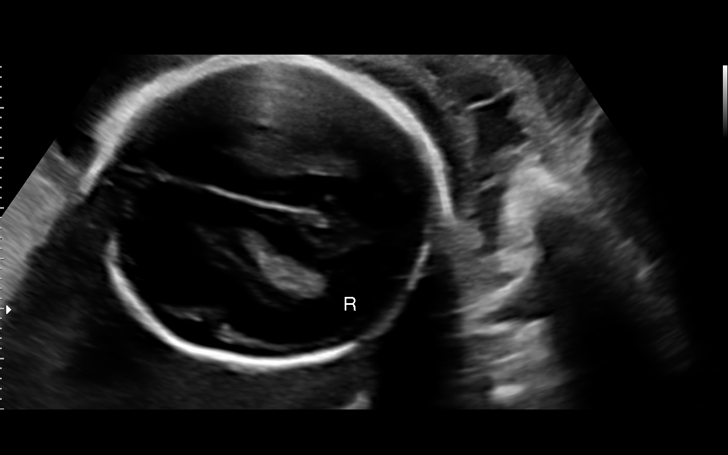
[im 31/59]
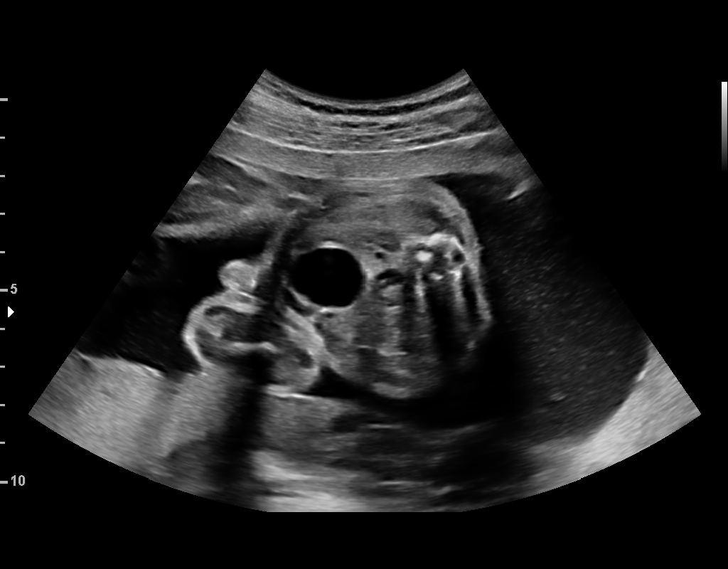
[im 35/59]
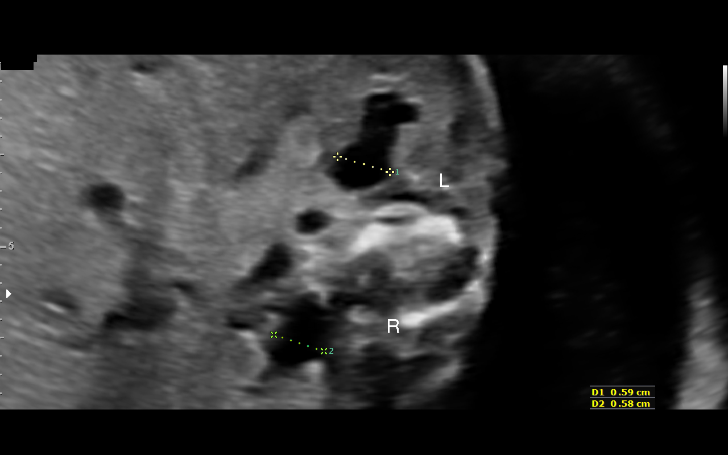
[im 39/59]
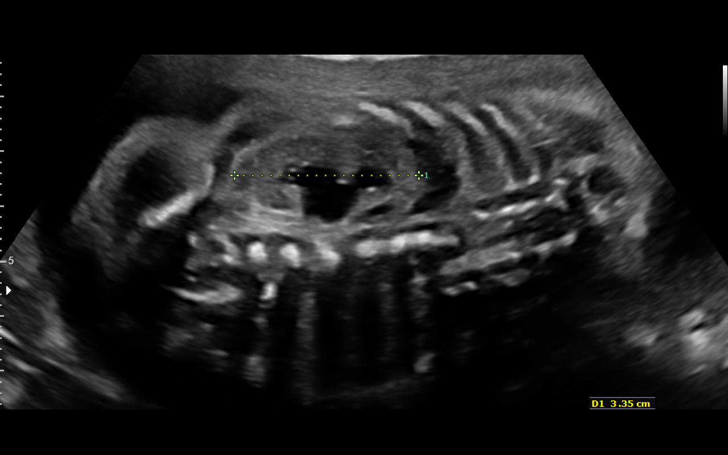
[im 43/59]
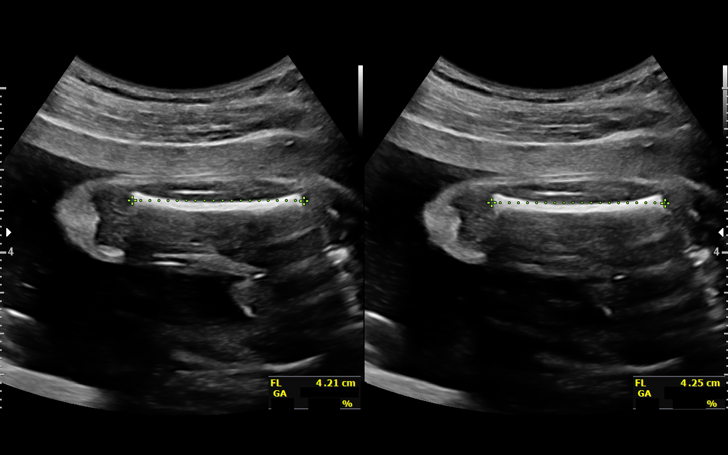
[im 48/59]
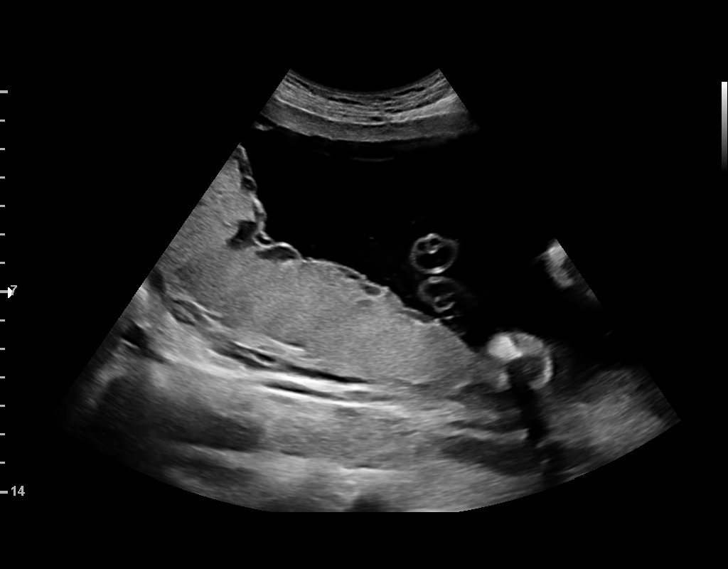
[im 52/59]
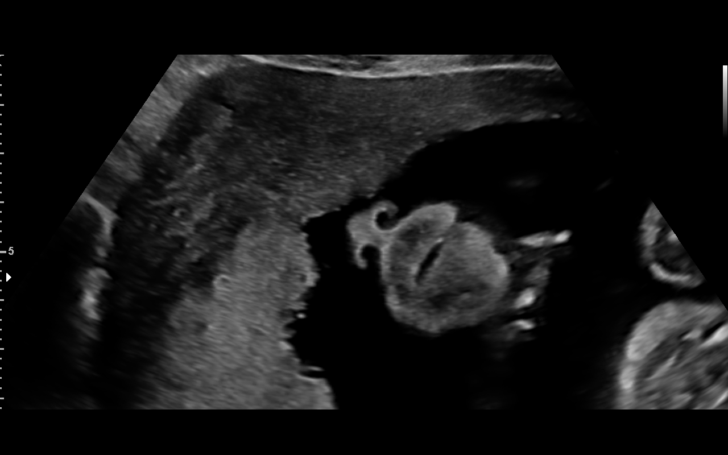
[im 56/59]
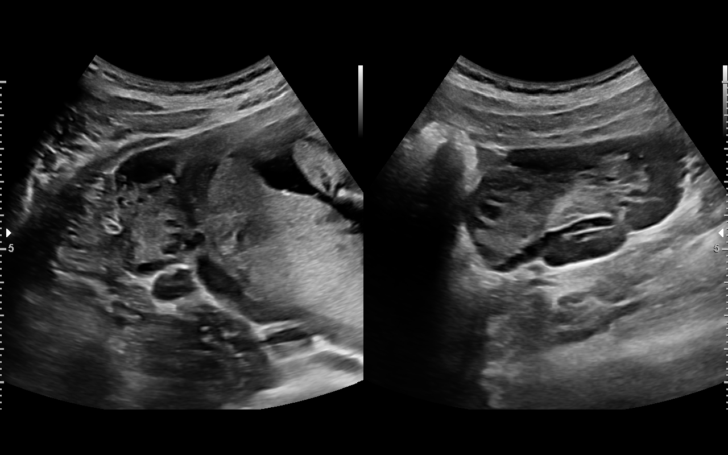

[13 of 28 positions shown; findings below may reference images not displayed]

FINDINGS: 1. Single intrauterine pregnancy with normal cardiac activity.
2. Estimated fetal weight is in the 64th%.
3. Posterior placenta without evidence of previa.
4. Normal amniotic fluid volume.
5. Normal transabdominal cervical length.
6. Again seen is bilateral urinary tract dilation; the renal
pelves measure 6mm bilaterally.
7. The remainder of the limited anatomy survey is normal.
Recommendations

1. Appropriate fetal growth.
2. Hypertension:
- well controlled without medications
- recommend fetal growth every 4 weeks
- recommend start antenatal testing at 32 weeks
- recommend close surveillance for the development of
signs/symptoms of preeclampsia
3. Fetal urinary tract dilation:
- previously counseled
- reevaluate on follow up ultrasound
4. Previous finding of choroid plexus cyst:
- previously counseled
- low risk cell free fetal DNA

## 2018-08-01 DIAGNOSIS — Z3482 Encounter for supervision of other normal pregnancy, second trimester: Secondary | ICD-10-CM | POA: Diagnosis not present

## 2018-08-01 DIAGNOSIS — Z3483 Encounter for supervision of other normal pregnancy, third trimester: Secondary | ICD-10-CM | POA: Diagnosis not present

## 2018-08-05 ENCOUNTER — Ambulatory Visit: Payer: 59 | Admitting: Family Medicine

## 2018-08-05 ENCOUNTER — Encounter: Payer: Self-pay | Admitting: Family Medicine

## 2018-08-05 VITALS — BP 149/89 | HR 65 | Temp 98.2°F | Wt 145.4 lb

## 2018-08-05 DIAGNOSIS — L2084 Intrinsic (allergic) eczema: Secondary | ICD-10-CM | POA: Diagnosis not present

## 2018-08-05 DIAGNOSIS — F430 Acute stress reaction: Secondary | ICD-10-CM | POA: Diagnosis not present

## 2018-08-05 DIAGNOSIS — I10 Essential (primary) hypertension: Secondary | ICD-10-CM | POA: Diagnosis not present

## 2018-08-05 HISTORY — DX: Intrinsic (allergic) eczema: L20.84

## 2018-08-05 MED ORDER — TRIAMCINOLONE ACETONIDE 0.5 % EX OINT: 1.0000 "application " | TOPICAL_OINTMENT | Freq: Two times a day (BID) | CUTANEOUS | 2 refills | Status: DC

## 2018-08-05 MED ORDER — HYDROCHLOROTHIAZIDE 12.5 MG PO TABS: 12.5000 mg | ORAL_TABLET | Freq: Every day | ORAL | 3 refills | Status: DC

## 2018-08-05 NOTE — Progress Notes (Signed)
Patient: Cynthia Crawford Female    DOB: 12-Mar-1991   27 y.o.   MRN: [id number] Visit Date: 08/05/2018  Today's Provider: Shirlee Latch, MD   Chief Complaint  Patient presents with  . Hypertension   Subjective:    HPI  Hypertension, follow-up:  BP Readings from Last 3 Encounters:  08/05/18 (!) 149/89  07/09/18 124/86  06/19/18 138/88    She was last seen for hypertension 1 months ago.  BP at that visit was 124/86. Management changes since that visit include advised to continue Amlodipine 5 mg at last OV. Patient advised to stop Amlodipine on 07/21/2018 due to lip swelling. She reports good compliance with treatment. She is not having side effects.  She is not exercising. She is adherent to low salt diet.   Outside blood pressures are not being checked. She is experiencing none.  Patient denies chest pain, chest pressure/discomfort, claudication, dyspnea, exertional chest pressure/discomfort, fatigue, irregular heart beat, lower extremity edema, near-syncope, orthopnea, palpitations, paroxysmal nocturnal dyspnea, syncope and tachypnea.   Cardiovascular risk factors include hypertension.  Use of agents associated with hypertension: none.     Weight trend: stable Wt Readings from Last 3 Encounters:  08/05/18 145 lb 6.4 oz (66 kg)  07/09/18 149 lb (67.6 kg)  06/19/18 150 lb (68 kg)   ------------------------------------------------------------------------ Patient also mentions a rash on her left flank that is been present for a few weeks.  It is dry and itchy.  She has not tried any medication on this.  She does not think she is ever had a rash like this before.  She wonders if it is eczema because her daughter has asked  Patient also mentions feeling anxious and sometimes overwhelmed with her children.  She states this does not affect her day-to-day life.  She denies any depressive symptoms.  She is tired and not sleeping well as she does have a  24-month-old baby.  She is not interested in therapy or medication at this time.  Depression screen [facility] 2/9 08/05/2018 04/16/2017  Decreased Interest 1 0  Down, Depressed, Hopeless 1 0  PHQ - 2 Score 2 0  Altered sleeping 0 -  Tired, decreased energy 1 -  Change in appetite 0 -  Feeling bad or failure about yourself  0 -  Trouble concentrating 0 -  Moving slowly or fidgety/restless 0 -  PHQ-9 Score 3 -  Difficult doing work/chores Somewhat difficult -    GAD 7 : Generalized Anxiety Score 08/05/2018  Nervous, Anxious, on Edge 2  Control/stop worrying 1  Worry too much - different things 1  Trouble relaxing 1  Restless 0  Easily annoyed or irritable 1  Afraid - awful might happen 2  Total GAD 7 Score 8  Anxiety Difficulty Somewhat difficult         Allergies  Allergen Reactions  . Penicillins Hives    Has patient had a PCN reaction causing immediate rash, facial/tongue/throat swelling, SOB or lightheadedness with hypotension: Yes Has patient had a PCN reaction causing severe rash involving mucus membranes or skin necrosis: No Has patient had a PCN reaction that required hospitalization No Has patient had a PCN reaction occurring within the last 10 years: No If all of the above answers are "NO", then may proceed with Cephalosporin use.  . Sulfa Antibiotics Hives     Current Outpatient Medications:  .  aspirin 81 MG tablet, Take 81 mg by mouth daily., Disp: , Rfl:  .  Prenatal Vit-Fe Fumarate-FA (PRENATAL MULTIVITAMIN) TABS tablet, Take 1 tablet by mouth at bedtime., Disp: , Rfl:  .  hydrochlorothiazide (HYDRODIURIL) 12.5 MG tablet, Take 1 tablet (12.5 mg total) by mouth daily., Disp: 30 tablet, Rfl: 3 .  triamcinolone ointment (KENALOG) 0.5 %, Apply 1 application topically 2 (two) times daily., Disp: 30 g, Rfl: 2  Review of Systems  Constitutional: Negative.   Respiratory: Negative.   Cardiovascular: Negative.   Musculoskeletal: Negative.   Skin: Positive for rash.      Social History   Tobacco Use  . Smoking status: Never Smoker  . Smokeless tobacco: Never Used  Substance Use Topics  . Alcohol use: No   Objective:   BP (!) 149/89 (BP Location: Right Arm, Patient Position: Sitting, Cuff Size: Normal)   Pulse 65   Temp 98.2 F (36.8 C) (Oral)   Wt 145 lb 6.4 oz (66 kg)   LMP 07/06/2018   SpO2 99%   BMI 24.96 kg/m  Vitals:   08/05/18 0939  BP: (!) 149/89  Pulse: 65  Temp: 98.2 F (36.8 C)  TempSrc: Oral  SpO2: 99%  Weight: 145 lb 6.4 oz (66 kg)     Physical Exam  Constitutional: She is oriented to person, place, and time. She appears well-developed and well-nourished. No distress.  HENT:  Head: Normocephalic and atraumatic.  Nose: Nose normal.  Mouth/Throat: Oropharynx is clear and moist.  Eyes: Pupils are equal, round, and reactive to light. Conjunctivae are normal. No scleral icterus.  Neck: Neck supple. No thyromegaly present.  Cardiovascular: Normal rate, regular rhythm, normal heart sounds and intact distal pulses.  No murmur heard. Pulmonary/Chest: Effort normal and breath sounds normal. No respiratory distress. She has no wheezes. She has no rales.  Musculoskeletal: She exhibits no edema.  Lymphadenopathy:    She has no cervical adenopathy.  Neurological: She is alert and oriented to person, place, and time.  Skin: Skin is warm and dry. Capillary refill takes less than 2 seconds. Rash (Erythematous, maculopapular rash with excoriations and surrounding dry skin on left flank) noted.  Psychiatric: She has a normal mood and affect. Her speech is normal and behavior is normal. She expresses no homicidal and no suicidal ideation. She expresses no suicidal plans and no homicidal plans.  Vitals reviewed.       Assessment & Plan:   Problem List Items Addressed This Visit      Cardiovascular and Mediastinum   Essential hypertension - Primary    Uncontrolled Discontinued amlodipine due to lip swelling and possible allergic  reaction Will start HCTZ 12.5 mg daily Reviewed lactmed and this is an appropriate choice for breast-feeding - "Hydrochlorothiazide doses of 50 mg daily or less are acceptable during lactation. Intense diuresis with large doses may decrease breastmilk production."      Relevant Medications   hydrochlorothiazide (HYDRODIURIL) 12.5 MG tablet     Musculoskeletal and Integument   Intrinsic eczema    New problem Rashes consistent with eczema Discussed importance of moisturizing and using sensitive skin products We will treat with triamcinolone ointment twice daily No signs of infection, but discussed return precautions        Other   Stress reaction    Patient with adjustment reaction/stress reaction since having her second child She does not have any symptoms of postpartum depression She is mildly anxious She is on Shavini medication or therapy at this time We will continue to monitor and reassess at next visit  Return in about 4 weeks (around 09/02/2018) for BP f/u.   The entirety of the information documented in the History of Present Illness, Review of Systems and Physical Exam were personally obtained by me. Portions of this information were initially documented by [name] and Dara Lords, CMA and reviewed by me for thoroughness and accuracy.    Erasmo Downer, MD, MPH [facility] 08/05/2018 10:51 AM

## 2018-08-05 NOTE — Assessment & Plan Note (Signed)
Patient with adjustment reaction/stress reaction since having her second child She does not have any symptoms of postpartum depression She is mildly anxious She is on Shavini medication or therapy at this time We will continue to monitor and reassess at next visit

## 2018-08-05 NOTE — Assessment & Plan Note (Signed)
Uncontrolled Discontinued amlodipine due to lip swelling and possible allergic reaction Will start HCTZ 12.5 mg daily Reviewed lactmed and this is an appropriate choice for breast-feeding - "Hydrochlorothiazide doses of 50 mg daily or less are acceptable during lactation. Intense diuresis with large doses may decrease breastmilk production."

## 2018-08-05 NOTE — Patient Instructions (Signed)

## 2018-08-05 NOTE — Assessment & Plan Note (Signed)
New problem Rashes consistent with eczema Discussed importance of moisturizing and using sensitive skin products We will treat with triamcinolone ointment twice daily No signs of infection, but discussed return precautions

## 2018-09-04 ENCOUNTER — Ambulatory Visit: Payer: Self-pay | Admitting: Family Medicine

## 2018-09-04 NOTE — Progress Notes (Deleted)
       Patient: Cynthia JarvisChasity Hargrave Crawford Female    DOB: Jun 09, 1991   27 y.o.   MRN: [id number] Visit Date: 09/04/2018  Today's Provider: Shirlee LatchAngela Bacigalupo, MD   No chief complaint on file.  Subjective:     HPI  Hypertension, follow-up:     BP Readings from Last 3 Encounters:  08/05/18 (!) 149/89  07/09/18 124/86  06/19/18 138/88    She was last seen for hypertension 1 months ago.  BP at that visit was 149/89 Management changes since that visit include advised to start HCTZ 12.5 mg. She is not having side effects.  She is not exercising. She is adherent to low salt diet.   Outside blood pressures are not being checked. She is experiencing none.  Patient denies chest pain, chest pressure/discomfort, claudication, dyspnea, exertional chest pressure/discomfort, fatigue, irregular heart beat, lower extremity edema, near-syncope, orthopnea, palpitations, paroxysmal nocturnal dyspnea, syncope and tachypnea.   Cardiovascular risk factors include hypertension.  Use of agents associated with hypertension: none.                Weight trend: stable    Wt Readings from Last 3 Encounters:  08/05/18 145 lb 6.4 oz (66 kg)  07/09/18 149 lb (67.6 kg)  06/19/18 150 lb (68 kg)    Allergies  Allergen Reactions  . Penicillins Hives    Has patient had a PCN reaction causing immediate rash, facial/tongue/throat swelling, SOB or lightheadedness with hypotension: Yes Has patient had a PCN reaction causing severe rash involving mucus membranes or skin necrosis: No Has patient had a PCN reaction that required hospitalization No Has patient had a PCN reaction occurring within the last 10 years: No If all of the above answers are "NO", then may proceed with Cephalosporin use.  . Sulfa Antibiotics Hives     Current Outpatient Medications:  .  aspirin 81 MG tablet, Take 81 mg by mouth daily., Disp: , Rfl:  .  hydrochlorothiazide (HYDRODIURIL) 12.5 MG tablet, Take 1 tablet (12.5 mg  total) by mouth daily., Disp: 30 tablet, Rfl: 3 .  Prenatal Vit-Fe Fumarate-FA (PRENATAL MULTIVITAMIN) TABS tablet, Take 1 tablet by mouth at bedtime., Disp: , Rfl:  .  triamcinolone ointment (KENALOG) 0.5 %, Apply 1 application topically 2 (two) times daily., Disp: 30 g, Rfl: 2  Review of Systems  Constitutional: Negative.   Respiratory: Negative.   Cardiovascular: Negative.   Musculoskeletal: Negative.     Social History   Tobacco Use  . Smoking status: Never Smoker  . Smokeless tobacco: Never Used  Substance Use Topics  . Alcohol use: No      Objective:   There were no vitals taken for this visit. There were no vitals filed for this visit.   Physical Exam      Assessment & Plan        Shirlee LatchAngela Bacigalupo, MD  [facility]

## 2018-09-15 DIAGNOSIS — Z3482 Encounter for supervision of other normal pregnancy, second trimester: Secondary | ICD-10-CM | POA: Diagnosis not present

## 2018-09-15 DIAGNOSIS — Z3483 Encounter for supervision of other normal pregnancy, third trimester: Secondary | ICD-10-CM | POA: Diagnosis not present

## 2018-09-19 ENCOUNTER — Ambulatory Visit: Payer: 59 | Admitting: Family Medicine

## 2018-10-02 ENCOUNTER — Ambulatory Visit (INDEPENDENT_AMBULATORY_CARE_PROVIDER_SITE_OTHER): Payer: 59 | Admitting: Family Medicine

## 2018-10-02 ENCOUNTER — Encounter: Payer: Self-pay | Admitting: Family Medicine

## 2018-10-02 VITALS — BP 133/82 | HR 86 | Temp 98.2°F | Resp 16 | Wt 143.0 lb

## 2018-10-02 DIAGNOSIS — R6889 Other general symptoms and signs: Secondary | ICD-10-CM | POA: Diagnosis not present

## 2018-10-02 DIAGNOSIS — Z20828 Contact with and (suspected) exposure to other viral communicable diseases: Secondary | ICD-10-CM

## 2018-10-02 LAB — POCT INFLUENZA A/B
INFLUENZA B, POC: NEGATIVE
Influenza A, POC: NEGATIVE

## 2018-10-02 MED ORDER — OSELTAMIVIR PHOSPHATE 75 MG PO CAPS: 75.0000 mg | ORAL_CAPSULE | Freq: Two times a day (BID) | ORAL | 0 refills | Status: DC

## 2018-10-02 NOTE — Progress Notes (Signed)
Patient: Cynthia JarvisChasity Hargrave Janco Female    DOB: [birth year]   28 y.o.   MRN: [id number] Visit Date: 10/02/2018  Today's Provider: Shirlee LatchAngela Bacigalupo, MD   Chief Complaint  Patient presents with  . URI   Subjective:     URI   This is a new problem. The current episode started yesterday. The problem has been unchanged. There has been no fever. Associated symptoms include coughing. Pertinent negatives include no abdominal pain, chest pain, congestion, diarrhea, dysuria, ear pain, headaches, joint pain, joint swelling, nausea, neck pain, plugged ear sensation, rash, rhinorrhea, sinus pain, sneezing, sore throat, swollen glands, vomiting or wheezing.   She is concerned about possible influenza as her 202 y/o daughter was diagnosed with Flu despite not having fever.  She has a 24 month old baby at home.  She is breastfeeding.   Allergies  Allergen Reactions  . Penicillins Hives    Has patient had a PCN reaction causing immediate rash, facial/tongue/throat swelling, SOB or lightheadedness with hypotension: Yes Has patient had a PCN reaction causing severe rash involving mucus membranes or skin necrosis: No Has patient had a PCN reaction that required hospitalization No Has patient had a PCN reaction occurring within the last 10 years: No If all of the above answers are "NO", then may proceed with Cephalosporin use.  . Sulfa Antibiotics Hives     Current Outpatient Medications:  .  hydrochlorothiazide (HYDRODIURIL) 12.5 MG tablet, Take 1 tablet (12.5 mg total) by mouth daily., Disp: 30 tablet, Rfl: 3 .  Prenatal Vit-Fe Fumarate-FA (PRENATAL MULTIVITAMIN) TABS tablet, Take 1 tablet by mouth at bedtime., Disp: , Rfl:  .  triamcinolone ointment (KENALOG) 0.5 %, Apply 1 application topically 2 (two) times daily., Disp: 30 g, Rfl: 2 .  aspirin 81 MG tablet, Take 81 mg by mouth daily., Disp: , Rfl:   Review of Systems  HENT: Negative for congestion, ear pain, rhinorrhea, sinus pain,  sneezing and sore throat.   Respiratory: Positive for cough. Negative for wheezing.   Cardiovascular: Negative for chest pain.  Gastrointestinal: Negative for abdominal pain, diarrhea, nausea and vomiting.  Genitourinary: Negative for dysuria.  Musculoskeletal: Positive for myalgias. Negative for joint pain and neck pain.  Skin: Negative for rash.  Neurological: Negative for headaches.    Social History   Tobacco Use  . Smoking status: Never Smoker  . Smokeless tobacco: Never Used  Substance Use Topics  . Alcohol use: No      Objective:   BP 133/82   Pulse 86   Temp 98.2 F (36.8 C) (Oral)   Resp 16   Wt 143 lb (64.9 kg)   SpO2 97%   BMI 24.55 kg/m  Vitals:   10/02/18 1054  BP: 133/82  Pulse: 86  Resp: 16  Temp: 98.2 F (36.8 C)  TempSrc: Oral  SpO2: 97%  Weight: 143 lb (64.9 kg)     Physical Exam Vitals signs reviewed.  Constitutional:      General: She is not in acute distress.    Appearance: Normal appearance. She is not diaphoretic.  HENT:     Head: Normocephalic and atraumatic.     Right Ear: Tympanic membrane, ear canal and external ear normal.     Left Ear: Tympanic membrane, ear canal and external ear normal.     Nose: Nose normal. No congestion or rhinorrhea.     Mouth/Throat:     Mouth: Mucous membranes are moist.     Pharynx: Oropharynx is  clear. No oropharyngeal exudate or posterior oropharyngeal erythema.  Eyes:     General: No scleral icterus.       Right eye: No discharge.        Left eye: No discharge.     Conjunctiva/sclera: Conjunctivae normal.     Pupils: Pupils are equal, round, and reactive to light.  Neck:     Musculoskeletal: Neck supple.  Cardiovascular:     Rate and Rhythm: Normal rate and regular rhythm.     Pulses: Normal pulses.     Heart sounds: Normal heart sounds. No murmur.  Pulmonary:     Effort: Pulmonary effort is normal. No respiratory distress.     Breath sounds: Normal breath sounds. No wheezing or rhonchi.    Abdominal:     General: There is no distension.     Palpations: Abdomen is soft.     Tenderness: There is no abdominal tenderness.  Musculoskeletal:        General: No tenderness.     Right lower leg: No edema.     Left lower leg: No edema.  Lymphadenopathy:     Cervical: No cervical adenopathy.  Skin:    General: Skin is warm and dry.     Capillary Refill: Capillary refill takes less than 2 seconds.     Findings: No rash.  Neurological:     General: No focal deficit present.     Mental Status: She is alert and oriented to person, place, and time. Mental status is at baseline.  Psychiatric:        Mood and Affect: Mood normal.        Behavior: Behavior normal.     Results for orders placed or performed in visit on 10/02/18  POCT Influenza A/B  Result Value Ref Range   Influenza A, POC Negative Negative   Influenza B, POC Negative Negative       Assessment & Plan   1. Flu-like symptoms 2. Exposure to influenza - symptoms and exam c/w viral URI - no evidence of strep pharyngitis, CAP, AOM, bacterial sinusitis, or other bacterial infection - discussed symptomatic management, natural course, and return precautions - negative for flu - delsym, mucinex safe in breastfeeding - avoid NSAIDs and sudafed to avoid decreasing milk supply - POCT Influenza A/B    Return if symptoms worsen or fail to improve.   The entirety of the information documented in the History of Present Illness, Review of Systems and Physical Exam were personally obtained by me. Portions of this information were initially documented by Nadene Rubins, CMA and reviewed by me for thoroughness and accuracy.    Erasmo Downer, MD, MPH [facility] 10/02/2018 1:55 PM

## 2018-10-02 NOTE — Patient Instructions (Signed)

## 2018-10-24 DIAGNOSIS — Z3482 Encounter for supervision of other normal pregnancy, second trimester: Secondary | ICD-10-CM | POA: Diagnosis not present

## 2018-10-24 DIAGNOSIS — Z3483 Encounter for supervision of other normal pregnancy, third trimester: Secondary | ICD-10-CM | POA: Diagnosis not present

## 2018-11-30 ENCOUNTER — Other Ambulatory Visit: Payer: Self-pay | Admitting: Family Medicine

## 2018-12-01 DIAGNOSIS — Z3483 Encounter for supervision of other normal pregnancy, third trimester: Secondary | ICD-10-CM | POA: Diagnosis not present

## 2018-12-01 DIAGNOSIS — Z3482 Encounter for supervision of other normal pregnancy, second trimester: Secondary | ICD-10-CM | POA: Diagnosis not present

## 2018-12-10 DIAGNOSIS — N201 Calculus of ureter: Secondary | ICD-10-CM | POA: Diagnosis not present

## 2018-12-15 ENCOUNTER — Encounter: Payer: Self-pay | Admitting: Radiology

## 2018-12-16 ENCOUNTER — Telehealth: Payer: Self-pay | Admitting: *Deleted

## 2018-12-16 NOTE — Telephone Encounter (Signed)
Patient called office wanting to discuss her allergy symptoms with a nurse. Patient is wanting advise about want medication she can take otc for her symptoms? Please advise?

## 2018-12-17 NOTE — Telephone Encounter (Signed)
She can take claritin, allegra, zyrtec, etc otc. Flonase sensimist is a good nasal steroid good for the nasal congestion and post nasal drainage. May use Mucinex (plain) or Mucinex DM (is she also has a cough) for congestion. Saline nasal washes also help. If she is still breast feeding avoid anything with Sudafed. It will dry up breast milk.

## 2018-12-17 NOTE — Telephone Encounter (Signed)
Spoke with patient regarding information given by Joycelyn Man, PA-C for allergies.

## 2019-01-12 ENCOUNTER — Ambulatory Visit: Payer: 59 | Admitting: Family Medicine

## 2019-01-16 ENCOUNTER — Encounter: Payer: Self-pay | Admitting: Family Medicine

## 2019-01-16 ENCOUNTER — Other Ambulatory Visit: Payer: Self-pay | Admitting: Family Medicine

## 2019-01-16 ENCOUNTER — Ambulatory Visit (INDEPENDENT_AMBULATORY_CARE_PROVIDER_SITE_OTHER): Payer: 59 | Admitting: Family Medicine

## 2019-01-16 VITALS — BP 128/86

## 2019-01-16 DIAGNOSIS — I10 Essential (primary) hypertension: Secondary | ICD-10-CM | POA: Diagnosis not present

## 2019-01-16 DIAGNOSIS — J45909 Unspecified asthma, uncomplicated: Secondary | ICD-10-CM | POA: Insufficient documentation

## 2019-01-16 DIAGNOSIS — Z87442 Personal history of urinary calculi: Secondary | ICD-10-CM

## 2019-01-16 DIAGNOSIS — J301 Allergic rhinitis due to pollen: Secondary | ICD-10-CM | POA: Diagnosis not present

## 2019-01-16 DIAGNOSIS — J4520 Mild intermittent asthma, uncomplicated: Secondary | ICD-10-CM

## 2019-01-16 HISTORY — DX: Personal history of urinary calculi: Z87.442

## 2019-01-16 MED ORDER — ALBUTEROL SULFATE HFA 108 (90 BASE) MCG/ACT IN AERS: 2.0000 | INHALATION_SPRAY | Freq: Four times a day (QID) | RESPIRATORY_TRACT | 0 refills | Status: DC | PRN

## 2019-01-16 MED ORDER — CETIRIZINE HCL 10 MG PO TABS: 10.0000 mg | ORAL_TABLET | Freq: Every day | ORAL | 11 refills | Status: DC

## 2019-01-16 MED ORDER — FLUTICASONE PROPIONATE 50 MCG/ACT NA SUSP: 2.0000 | Freq: Every day | NASAL | 6 refills | Status: DC

## 2019-01-16 NOTE — Assessment & Plan Note (Signed)
Chronic Well controlled Did not tolerate amlodipine Doing well on HCTZ- will continue Repeat labs, with screening FLP

## 2019-01-16 NOTE — Assessment & Plan Note (Signed)
Uncontrolled Mild and intermittent Allergy control as below Albuterol prn Discussed return precautions

## 2019-01-16 NOTE — Patient Instructions (Signed)
Asthma, Adult    Asthma is a long-term (chronic) condition that causes recurrent episodes in which the airways become tight and narrow. The airways are the passages that lead from the nose and mouth down into the lungs. Asthma episodes, also called asthma attacks, can cause coughing, wheezing, shortness of breath, and chest pain. The airways can also fill with mucus. During an attack, it can be difficult to breathe. Asthma attacks can range from minor to life threatening.  Asthma cannot be cured, but medicines and lifestyle changes can help control it and treat acute attacks.  What are the causes?  This condition is believed to be caused by inherited (genetic) and environmental factors, but its exact cause is not known.  There are many things that can bring on an asthma attack or make asthma symptoms worse (triggers). Asthma triggers are different for each person. Common triggers include:   Mold.   Dust.   Cigarette smoke.   Cockroaches.   Things that can cause allergy symptoms (allergens), such as animal dander or pollen from trees or grass.   Air pollutants such as household cleaners, wood smoke, smog, or chemical odors.   Cold air, weather changes, and winds (which increase molds and pollen in the air).   Strong emotional expressions such as crying or laughing hard.   Stress.   Certain medicines (such as aspirin) or types of medicines (such as beta-blockers).   Sulfites in foods and drinks. Foods and drinks that may contain sulfites include dried fruit, potato chips, and sparkling grape juice.   Infections or inflammatory conditions such as the flu, a cold, or inflammation of the nasal membranes (rhinitis).   Gastroesophageal reflux disease (GERD).   Exercise or strenuous activity.  What are the signs or symptoms?  Symptoms of this condition may occur right after asthma is triggered or many hours later. Symptoms include:   Wheezing. This can sound like whistling when you breathe.   Excessive  nighttime or early morning coughing.   Frequent or severe coughing with a common cold.   Chest tightness.   Shortness of breath.   Tiredness (fatigue) with minimal activity.  How is this diagnosed?  This condition is diagnosed based on:   Your medical history.   A physical exam.   Tests, which may include:  ? Lung function studies and pulmonary studies (spirometry). These tests can evaluate the flow of air in your lungs.  ? Allergy tests.  ? Imaging tests, such as X-rays.  How is this treated?  There is no cure for this condition, but treatment can help control your symptoms. Treatment for asthma usually involves:   Identifying and avoiding your asthma triggers.   Using medicines to control your symptoms. Generally, two types of medicines are used to treat asthma:  ? Controller medicines. These help prevent asthma symptoms from occurring. They are usually taken every day.  ? Fast-acting reliever or rescue medicines. These quickly relieve asthma symptoms by widening the narrow and tight airways. They are used as needed and provide short-term relief.   Using supplemental oxygen. This may be needed during a severe episode.   Using other medicines, such as:  ? Allergy medicines, such as antihistamines, if your asthma attacks are triggered by allergens.  ? Immune medicines (immunomodulators). These are medicines that help control the immune system.   Creating an asthma action plan. An asthma action plan is a written plan for managing and treating your asthma attacks. This plan includes:  ? A list   of your asthma triggers and how to avoid them.  ? Information about when medicines should be taken and when their dosage should be changed.  ? Instructions about using a device called a peak flow meter. A peak flow meter measures how well the lungs are working and the severity of your asthma. It helps you monitor your condition.  Follow these instructions at home:  Controlling your home environment  Control your home  environment in the following ways to help avoid triggers and prevent asthma attacks:   Change your heating and air conditioning filter regularly.   Limit your use of fireplaces and wood stoves.   Get rid of pests (such as roaches and mice) and their droppings.   Throw away plants if you see mold on them.   Clean floors and dust surfaces regularly. Use unscented cleaning products.   Try to have someone else vacuum for you regularly. Stay out of rooms while they are being vacuumed and for a short while afterward. If you vacuum, use a dust mask from a hardware store, a double-layered or microfilter vacuum cleaner bag, or a vacuum cleaner with a HEPA filter.   Replace carpet with wood, tile, or vinyl flooring. Carpet can trap dander and dust.   Use allergy-proof pillows, mattress covers, and box spring covers.   Keep your bedroom a trigger-free room.   Avoid pets and keep windows closed when allergens are in the air.   Wash beddings every week in hot water and dry them in a dryer.   Use blankets that are made of polyester or cotton.   Clean bathrooms and kitchens with bleach. If possible, have someone repaint the walls in these rooms with mold-resistant paint. Stay out of the rooms that are being cleaned and painted.   Wash your hands often with soap and water. If soap and water are not available, use hand sanitizer.   Do not allow anyone to smoke in your home.  General instructions   Take over-the-counter and prescription medicines only as told by your health care provider.  ? Speak with your health care provider if you have questions about how or when to take the medicines.  ? Make note if you are requiring more frequent dosages.   Do not use any products that contain nicotine or tobacco, such as cigarettes and e-cigarettes. If you need help quitting, ask your health care provider. Also, avoid being exposed to secondhand smoke.   Use a peak flow meter as told by your health care provider. Record and  keep track of the readings.   Understand and use the asthma action plan to help minimize, or stop an asthma attack, without needing to seek medical care.   Make sure you stay up to date on your yearly vaccinations as told by your health care provider. This may include vaccines for the flu and pneumonia.   Avoid outdoor activities when allergen counts are high and when air quality is low.   Wear a ski mask that covers your nose and mouth during outdoor winter activities. Exercise indoors on cold days if you can.   Warm up before exercising, and take time for a cool-down period after exercise.   Keep all follow-up visits as told by your health care provider. This is important.  Where to find more information   For information about asthma, turn to the Centers for Disease Control and Prevention at www.cdc.gov/asthma/faqs.htm   For air quality information, turn to AirNow at https://airnow.gov/  Contact   a health care provider if:   You have wheezing, shortness of breath, or a cough even while you are taking medicine to prevent attacks.   The mucus you cough up (sputum) is thicker than usual.   Your sputum changes from clear or white to yellow, green, gray, or bloody.   Your medicines are causing side effects, such as a rash, itching, swelling, or trouble breathing.   You need to use a reliever medicine more than 2-3 times a week.   Your peak flow reading is still at 50-79% of your personal best after following your action plan for 1 hour.   You have a fever.  Get help right away if:   You are getting worse and do not respond to treatment during an asthma attack.   You are short of breath when at rest or when doing very little physical activity.   You have difficulty eating, drinking, or talking.   You have chest pain or tightness.   You develop a fast heartbeat or palpitations.   You have a bluish color to your lips or fingernails.   You are light-headed or dizzy, or you faint.   Your peak flow  reading is less than 50% of your personal best.   You feel too tired to breathe normally.  Summary   Asthma is a long-term (chronic) condition that causes recurrent episodes in which the airways become tight and narrow. These episodes can cause coughing, wheezing, shortness of breath, and chest pain.   Asthma cannot be cured, but medicines and lifestyle changes can help control it and treat acute attacks.   Make sure you understand how to avoid triggers and how and when to use your medicines.   Asthma attacks can range from minor to life threatening. Get help right away if you have an asthma attack and do not respond to treatment with your usual rescue medicines.  This information is not intended to replace advice given to you by your health care provider. Make sure you discuss any questions you have with your health care provider.  Document Released: 09/03/2005 Document Revised: 10/08/2016 Document Reviewed: 10/08/2016  Elsevier Interactive Patient Education  2019 [employer] Inc[employer]

## 2019-01-16 NOTE — Assessment & Plan Note (Signed)
Uncontrolled Start Zyrtec and flonase Avoidance of triggers, lifestyle changes

## 2019-01-16 NOTE — Progress Notes (Signed)
Patient: Cynthia JarvisChasity Hargrave Crawford Female    DOB: 1990/12/08   28 y.o.   MRN: [id number] Visit Date: 01/16/2019  Today's Provider: Shirlee LatchAngela Bacigalupo, MD   Chief Complaint  Patient presents with  . Hypertension   Subjective:    Virtual Visit via Video Note  I connected with Cynthia Crawford on 01/16/19 at 10:20 AM EDT by a video enabled telemedicine application and verified that I am speaking with the correct person using two identifiers.   Patient location: home Provider location: home office  Persons involved in the visit: patient, provider   I discussed the limitations of evaluation and management by telemedicine and the availability of in person appointments. The patient expressed understanding and agreed to proceed.  HPI  Hypertension, follow-up:  BP Readings from Last 3 Encounters:  01/16/19 128/86  10/02/18 133/82  08/05/18 (!) 149/89    She was last seen for hypertension 6 months ago.  BP at that visit was 133/82. Management changes since that visit include added HCTZ. She reports good compliance with treatment. She is not having side effects.  She is exercising. She is adherent to low salt diet.   Outside blood pressures are being checked at home. She is experiencing none.  Patient denies chest pain, chest pressure/discomfort, claudication, dyspnea, exertional chest pressure/discomfort, fatigue, irregular heart beat, lower extremity edema, near-syncope, orthopnea, palpitations, paroxysmal nocturnal dyspnea, syncope and tachypnea.   Cardiovascular risk factors include hypertension.  Use of agents associated with hypertension: none.     Weight trend: stable Wt Readings from Last 3 Encounters:  10/02/18 143 lb (64.9 kg)  08/05/18 145 lb 6.4 oz (66 kg)  07/09/18 149 lb (67.6 kg)    ------------------------------------------------------------------------ Has h/o asthma. Having more wheezing related to allergies recently. Not currently using any  antihistamine or nasal spray Not needing albuterol frequently at all  Recent kidney stone that seems to have passed Not drinking sweet tea or sodas Has had a few that spontaneously passed   Allergies  Allergen Reactions  . Penicillins Hives    Has patient had a PCN reaction causing immediate rash, facial/tongue/throat swelling, SOB or lightheadedness with hypotension: Yes Has patient had a PCN reaction causing severe rash involving mucus membranes or skin necrosis: No Has patient had a PCN reaction that required hospitalization No Has patient had a PCN reaction occurring within the last 10 years: No If all of the above answers are "NO", then may proceed with Cephalosporin use.  . Sulfa Antibiotics Hives     Current Outpatient Medications:  .  aspirin 81 MG tablet, Take 81 mg by mouth daily., Disp: , Rfl:  .  hydrochlorothiazide (HYDRODIURIL) 12.5 MG tablet, TAKE 1 TABLET BY MOUTH EVERY DAY, Disp: 90 tablet, Rfl: 1 .  Prenatal Vit-Fe Fumarate-FA (PRENATAL MULTIVITAMIN) TABS tablet, Take 1 tablet by mouth at bedtime., Disp: , Rfl:  .  triamcinolone ointment (KENALOG) 0.5 %, Apply 1 application topically 2 (two) times daily., Disp: 30 g, Rfl: 2 .  albuterol (VENTOLIN HFA) 108 (90 Base) MCG/ACT inhaler, Inhale 2 puffs into the lungs every 6 (six) hours as needed for wheezing or shortness of breath., Disp: 1 Inhaler, Rfl: 0 .  cetirizine (ZYRTEC) 10 MG tablet, Take 1 tablet (10 mg total) by mouth daily., Disp: 30 tablet, Rfl: 11 .  flunisolide (NASALIDE) 25 MCG/ACT (0.025%) SOLN, Please specify directions, refills and quantity, Disp: 25 mL, Rfl: 6  Review of Systems  Constitutional: Negative.   Respiratory: Negative.  Cardiovascular: Negative.   Musculoskeletal: Negative.     Social History   Tobacco Use  . Smoking status: Never Smoker  . Smokeless tobacco: Never Used  Substance Use Topics  . Alcohol use: No      Objective:   BP 128/86  Vitals:   01/16/19 1018  BP:  128/86     Physical Exam Constitutional:      Appearance: Normal appearance.  Pulmonary:     Effort: Pulmonary effort is normal. No respiratory distress.  Neurological:     Mental Status: She is alert and oriented to person, place, and time. Mental status is at baseline.  Psychiatric:        Mood and Affect: Mood normal.        Behavior: Behavior normal.         Assessment & Plan    I discussed the assessment and treatment plan with the patient. The patient was provided an opportunity to ask questions and all were answered. The patient agreed with the plan and demonstrated an understanding of the instructions.   The patient was advised to call back or seek an in-person evaluation if the symptoms worsen or if the condition fails to improve as anticipated.  Problem List Items Addressed This Visit      Cardiovascular and Mediastinum   Essential hypertension - Primary    Chronic Well controlled Did not tolerate amlodipine Doing well on HCTZ- will continue Repeat labs, with screening FLP      Relevant Orders   Comprehensive metabolic panel   Lipid panel     Respiratory   Allergic asthma    Uncontrolled Mild and intermittent Allergy control as below Albuterol prn Discussed return precautions      Relevant Medications   albuterol (VENTOLIN HFA) 108 (90 Base) MCG/ACT inhaler   Seasonal allergic rhinitis due to pollen    Uncontrolled Start Zyrtec and flonase Avoidance of triggers, lifestyle changes        Other   History of kidney stones    Advised on lifestyle changes and staying hydrated          Return in about 6 months (around 07/19/2019) for CPE.   The entirety of the information documented in the History of Present Illness, Review of Systems and Physical Exam were personally obtained by me. Portions of this information were initially documented by [name], CMA and reviewed by me for thoroughness and accuracy.    Erasmo Downer, MD, MPH  [facility] 01/16/2019 3:53 PM

## 2019-01-16 NOTE — Assessment & Plan Note (Signed)
Advised on lifestyle changes and staying hydrated

## 2019-01-23 DIAGNOSIS — I10 Essential (primary) hypertension: Secondary | ICD-10-CM | POA: Diagnosis not present

## 2019-01-24 LAB — COMPREHENSIVE METABOLIC PANEL
ALT: 12 IU/L (ref 0–32)
AST: 14 IU/L (ref 0–40)
Albumin/Globulin Ratio: 1.6 (ref 1.2–2.2)
Albumin: 4.5 g/dL (ref 3.9–5.0)
Alkaline Phosphatase: 54 IU/L (ref 39–117)
BUN/Creatinine Ratio: 16 (ref 9–23)
BUN: 10 mg/dL (ref 6–20)
Bilirubin Total: 0.3 mg/dL (ref 0.0–1.2)
CO2: 23 mmol/L (ref 20–29)
Calcium: 9.1 mg/dL (ref 8.7–10.2)
Chloride: 105 mmol/L (ref 96–106)
Creatinine, Ser: 0.61 mg/dL (ref 0.57–1.00)
GFR calc Af Amer: 144 mL/min/{1.73_m2} (ref >59)
GFR calc non Af Amer: 125 mL/min/{1.73_m2} (ref >59)
Globulin, Total: 2.8 g/dL (ref 1.5–4.5)
Glucose: 84 mg/dL (ref 65–99)
Potassium: 4.1 mmol/L (ref 3.5–5.2)
Sodium: 142 mmol/L (ref 134–144)
Total Protein: 7.3 g/dL (ref 6.0–8.5)

## 2019-01-24 LAB — LIPID PANEL
Chol/HDL Ratio: 1.9 ratio (ref 0.0–4.4)
Cholesterol, Total: 158 mg/dL (ref 100–199)
HDL: 82 mg/dL (ref >39)
LDL Calculated: 68 mg/dL (ref 0–99)
Triglycerides: 39 mg/dL (ref 0–149)
VLDL Cholesterol Cal: 8 mg/dL (ref 5–40)

## 2019-01-26 ENCOUNTER — Telehealth: Payer: Self-pay

## 2019-01-26 NOTE — Telephone Encounter (Signed)
-----   Message from Erasmo Downer, MD sent at 01/26/2019  9:13 AM EDT ----- Normal labs

## 2019-01-26 NOTE — Telephone Encounter (Signed)
Patient was advised.  

## 2019-03-04 ENCOUNTER — Inpatient Hospital Stay (HOSPITAL_COMMUNITY): Payer: 59

## 2019-03-04 ENCOUNTER — Other Ambulatory Visit: Payer: Self-pay

## 2019-03-04 ENCOUNTER — Encounter (HOSPITAL_COMMUNITY): Payer: Self-pay

## 2019-03-04 ENCOUNTER — Inpatient Hospital Stay (HOSPITAL_COMMUNITY)
Admission: AD | Admit: 2019-03-04 | Discharge: 2019-03-04 | Disposition: A | Discharge status: Home / Self Care | Payer: 59 | Attending: Obstetrics and Gynecology | Admitting: Obstetrics and Gynecology

## 2019-03-04 DIAGNOSIS — Z825 Family history of asthma and other chronic lower respiratory diseases: Secondary | ICD-10-CM | POA: Diagnosis not present

## 2019-03-04 DIAGNOSIS — Z3A01 Less than 8 weeks gestation of pregnancy: Secondary | ICD-10-CM | POA: Diagnosis not present

## 2019-03-04 DIAGNOSIS — Z7982 Long term (current) use of aspirin: Secondary | ICD-10-CM | POA: Diagnosis not present

## 2019-03-04 DIAGNOSIS — Z833 Family history of diabetes mellitus: Secondary | ICD-10-CM | POA: Diagnosis not present

## 2019-03-04 DIAGNOSIS — Z3490 Encounter for supervision of normal pregnancy, unspecified, unspecified trimester: Secondary | ICD-10-CM

## 2019-03-04 DIAGNOSIS — Z87442 Personal history of urinary calculi: Secondary | ICD-10-CM | POA: Insufficient documentation

## 2019-03-04 DIAGNOSIS — Z880 Allergy status to penicillin: Secondary | ICD-10-CM | POA: Diagnosis not present

## 2019-03-04 DIAGNOSIS — Z79899 Other long term (current) drug therapy: Secondary | ICD-10-CM | POA: Insufficient documentation

## 2019-03-04 DIAGNOSIS — R102 Pelvic and perineal pain: Secondary | ICD-10-CM

## 2019-03-04 DIAGNOSIS — Z882 Allergy status to sulfonamides status: Secondary | ICD-10-CM | POA: Insufficient documentation

## 2019-03-04 DIAGNOSIS — J45909 Unspecified asthma, uncomplicated: Secondary | ICD-10-CM | POA: Diagnosis not present

## 2019-03-04 DIAGNOSIS — O26891 Other specified pregnancy related conditions, first trimester: Secondary | ICD-10-CM | POA: Insufficient documentation

## 2019-03-04 DIAGNOSIS — O99511 Diseases of the respiratory system complicating pregnancy, first trimester: Secondary | ICD-10-CM | POA: Diagnosis not present

## 2019-03-04 LAB — URINALYSIS, ROUTINE W REFLEX MICROSCOPIC
Bilirubin Urine: NEGATIVE
Glucose, UA: NEGATIVE mg/dL
Hgb urine dipstick: NEGATIVE
Ketones, ur: NEGATIVE mg/dL
Nitrite: NEGATIVE
Protein, ur: NEGATIVE mg/dL
Specific Gravity, Urine: 1.026 (ref 1.005–1.030)
pH: 6 (ref 5.0–8.0)

## 2019-03-04 LAB — CBC WITH DIFFERENTIAL/PLATELET
Abs Immature Granulocytes: 0.01 10*3/uL (ref 0.00–0.07)
Basophils Absolute: 0 10*3/uL (ref 0.0–0.1)
Basophils Relative: 0 %
Eosinophils Absolute: 0.1 10*3/uL (ref 0.0–0.5)
Eosinophils Relative: 2 %
HCT: 34.3 % — ABNORMAL LOW (ref 36.0–46.0)
Hemoglobin: 11.6 g/dL — ABNORMAL LOW (ref 12.0–15.0)
Immature Granulocytes: 0 %
Lymphocytes Relative: 30 %
Lymphs Abs: 2 10*3/uL (ref 0.7–4.0)
MCH: 30.2 pg (ref 26.0–34.0)
MCHC: 33.8 g/dL (ref 30.0–36.0)
MCV: 89.3 fL (ref 80.0–100.0)
Monocytes Absolute: 0.5 10*3/uL (ref 0.1–1.0)
Monocytes Relative: 7 %
Neutro Abs: 4.1 10*3/uL (ref 1.7–7.7)
Neutrophils Relative %: 61 %
Platelets: 184 10*3/uL (ref 150–400)
RBC: 3.84 MIL/uL — ABNORMAL LOW (ref 3.87–5.11)
RDW: 12.2 % (ref 11.5–15.5)
WBC: 6.8 10*3/uL (ref 4.0–10.5)
nRBC: 0 % (ref 0.0–0.2)

## 2019-03-04 LAB — HCG, QUANTITATIVE, PREGNANCY: hCG, Beta Chain, Quant, S: 6282 m[IU]/mL — ABNORMAL HIGH (ref <5)

## 2019-03-04 LAB — WET PREP, GENITAL
Sperm: NONE SEEN
Trich, Wet Prep: NONE SEEN
Yeast Wet Prep HPF POC: NONE SEEN

## 2019-03-04 LAB — POCT PREGNANCY, URINE: Preg Test, Ur: POSITIVE — AB

## 2019-03-04 NOTE — MAU Note (Signed)
Cynthia Crawford is a 28 y.o. at [redacted]w[redacted]d here in MAU reporting: been having a dull pain on the right side of her abdomen, is intermittent. No bleeding or abnormal discharge  LMP: 01/24/19 approximately  Onset of complaint: a few days  Pain score: 4/10  Vitals:   03/04/19 1732  BP: (!) 146/87  Pulse: 85  Resp: 16  Temp: 98.7 F (37.1 C)  SpO2: 100%      Lab orders placed from triage: UA, UPT

## 2019-03-04 NOTE — MAU Provider Note (Signed)
History     CSN: [id number]  Arrival date and time: 03/04/19 1712   First Provider Initiated Contact with Patient 03/04/19 1827      Chief Complaint  Patient presents with  . Abdominal Pain   Pelvic Pain The patient's primary symptoms include pelvic pain. The patient's pertinent negatives include no vaginal discharge. This is a new problem. The current episode started yesterday. The problem occurs intermittently. The problem has been unchanged. The problem affects the right side. She is pregnant. Pertinent negatives include no chills, fever, nausea or vomiting. The vaginal discharge was normal. There has been no bleeding. Nothing aggravates the symptoms. She has tried nothing for the symptoms. Menstrual history: LMP 01/24/2019.    OB History    Gravida  3   Para  2   Term  2   Preterm  0   AB  0   Living  2     SAB  0   TAB  0   Ectopic  0   Multiple  0   Live Births  2           Past Medical History:  Diagnosis Date  . Asthma   . Chronic hypertension during pregnancy, antepartum 08/29/2017   [x]  Aspirin 81 mg daily after 12 weeks; discontinue after 36 weeks Current antihypertensives:  None  Was on Enalapril prior to pregnancy  Baseline and surveillance labs (pulled in from [facility], refresh links as needed)  Lab Results Component Value Date  PLT 213 10/16/2017  CREATININE 0.61 10/16/2017  AST 17 10/16/2017  ALT 13 10/16/2017  PROTCRRATIO 0.21 (H) 01/17/2016  PROTEIN24HR 242 (H) 01/16/2016  . Contact dermatitis due to soap 11/20/2017  . History of gestational hypertension   . Kidney stones     Past Surgical History:  Procedure Laterality Date  . TONSILLECTOMY      Family History  Problem Relation Age of Onset  . Diabetes Father   . Asthma Mother     Social History   Tobacco Use  . Smoking status: Never Smoker  . Smokeless tobacco: Never Used  Substance Use Topics  . Alcohol use: No  . Drug use: No    Allergies:  Allergies  Allergen Reactions  .  Penicillins Hives    Has patient had a PCN reaction causing immediate rash, facial/tongue/throat swelling, SOB or lightheadedness with hypotension: Yes Has patient had a PCN reaction causing severe rash involving mucus membranes or skin necrosis: No Has patient had a PCN reaction that required hospitalization No Has patient had a PCN reaction occurring within the last 10 years: No If all of the above answers are "NO", then may proceed with Cephalosporin use.  . Sulfa Antibiotics Hives    Medications Prior to Admission  Medication Sig Dispense Refill Last Dose  . cetirizine (ZYRTEC) 10 MG tablet Take 1 tablet (10 mg total) by mouth daily. 30 tablet 11 03/03/2019 at Unknown time  . hydrochlorothiazide (HYDRODIURIL) 12.5 MG tablet TAKE 1 TABLET BY MOUTH EVERY DAY 90 tablet 1 Past Week at Unknown time  . Prenatal Vit-Fe Fumarate-FA (PRENATAL MULTIVITAMIN) TABS tablet Take 1 tablet by mouth at bedtime.   03/03/2019 at Unknown time  . albuterol (VENTOLIN HFA) 108 (90 Base) MCG/ACT inhaler Inhale 2 puffs into the lungs every 6 (six) hours as needed for wheezing or shortness of breath. 1 Inhaler 0   . aspirin 81 MG tablet Take 81 mg by mouth daily.     . flunisolide (NASALIDE) 25 MCG/ACT (0.025%)  SOLN Please specify directions, refills and quantity 25 mL 6   . triamcinolone ointment (KENALOG) 0.5 % Apply 1 application topically 2 (two) times daily. 30 g 2 More than a month at Unknown time    Review of Systems  Constitutional: Negative for chills and fever.  Gastrointestinal: Negative for nausea and vomiting.  Genitourinary: Positive for pelvic pain. Negative for vaginal bleeding and vaginal discharge.   Physical Exam   Blood pressure (!) 146/87, pulse 85, temperature 98.7 F (37.1 C), temperature source Oral, resp. rate 16, height 5\' 4"  (1.626 m), weight 64.8 kg, last menstrual period 01/24/2019, SpO2 100 %, currently breastfeeding.  Physical Exam  Nursing note and vitals  reviewed. Constitutional: She is oriented to person, place, and time. She appears well-developed and well-nourished. No distress.  HENT:  Head: Normocephalic.  Cardiovascular: Normal rate.  Respiratory: Effort normal.  GI: Soft. There is no abdominal tenderness. There is no rebound.  Neurological: She is alert and oriented to person, place, and time.  Skin: Skin is warm and dry.  Psychiatric: She has a normal mood and affect.   Results for orders placed or performed during the hospital encounter of 03/04/19 (from the past 24 hour(s))  Pregnancy, urine POC     Status: Abnormal   Collection Time: 03/04/19  5:27 PM  Result Value Ref Range   Preg Test, Ur POSITIVE (A) NEGATIVE  Urinalysis, Routine w reflex microscopic     Status: Abnormal   Collection Time: 03/04/19  5:35 PM  Result Value Ref Range   Color, Urine YELLOW YELLOW   APPearance CLEAR CLEAR   Specific Gravity, Urine 1.026 1.005 - 1.030   pH 6.0 5.0 - 8.0   Glucose, UA NEGATIVE NEGATIVE mg/dL   Hgb urine dipstick NEGATIVE NEGATIVE   Bilirubin Urine NEGATIVE NEGATIVE   Ketones, ur NEGATIVE NEGATIVE mg/dL   Protein, ur NEGATIVE NEGATIVE mg/dL   Nitrite NEGATIVE NEGATIVE   Leukocytes,Ua TRACE (A) NEGATIVE   RBC / HPF 0-5 0 - 5 RBC/hpf   WBC, UA 0-5 0 - 5 WBC/hpf   Bacteria, UA FEW (A) NONE SEEN   Squamous Epithelial / LPF 0-5 0 - 5   Mucus PRESENT   Wet prep, genital     Status: Abnormal   Collection Time: 03/04/19  6:15 PM   Specimen: Vaginal  Result Value Ref Range   Yeast Wet Prep HPF POC NONE SEEN NONE SEEN   Trich, Wet Prep NONE SEEN NONE SEEN   Clue Cells Wet Prep HPF POC PRESENT (A) NONE SEEN   WBC, Wet Prep HPF POC MODERATE (A) NONE SEEN   Sperm NONE SEEN   CBC with Differential/Platelet     Status: Abnormal   Collection Time: 03/04/19  6:17 PM  Result Value Ref Range   WBC 6.8 4.0 - 10.5 K/uL   RBC 3.84 (L) 3.87 - 5.11 MIL/uL   Hemoglobin 11.6 (L) 12.0 - 15.0 g/dL   HCT 13.034.3 (L) 86.536.0 - 78.446.0 %   MCV 89.3  80.0 - 100.0 fL   MCH 30.2 26.0 - 34.0 pg   MCHC 33.8 30.0 - 36.0 g/dL   RDW 69.612.2 29.511.5 - 28.415.5 %   Platelets 184 150 - 400 K/uL   nRBC 0.0 0.0 - 0.2 %   Neutrophils Relative % 61 %   Neutro Abs 4.1 1.7 - 7.7 K/uL   Lymphocytes Relative 30 %   Lymphs Abs 2.0 0.7 - 4.0 K/uL   Monocytes Relative 7 %   Monocytes  Absolute 0.5 0.1 - 1.0 K/uL   Eosinophils Relative 2 %   Eosinophils Absolute 0.1 0.0 - 0.5 K/uL   Basophils Relative 0 %   Basophils Absolute 0.0 0.0 - 0.1 K/uL   Immature Granulocytes 0 %   Abs Immature Granulocytes 0.01 0.00 - 0.07 K/uL   [place] Ob Less Than 14 Weeks With Ob Transvaginal  Result Date: 03/04/2019 CLINICAL DATA:  Pain EXAM: OBSTETRIC <14 WK US AND TRANSVAGINAL OB US TECHNIQUE: Both transabdominal and transvaginal ultrasound examinations were performed for complete evaluation of the gestation as well as the maternal uterus, adnexal regions, and pelvic cul-de-sac. Transvaginal technique was performed to assess early pregnancy. COMPARISON:  None. FINDINGS: Intrauterine gestational sac: Single Yolk sac:  Visualized. Embryo:  Not Visualized. Cardiac Activity: Not Visualized. Heart Rate: Not visualized MSD: 7.6 mm   5 w   3 d Subchorionic hemorrhage: There is a moderate amount of subchorionic hemorrhage. Maternal uterus/adnexae: The right ovary measures 4.3 x 2.6 x 2.2 cm. A corpus luteal cyst is noted. The left ovary measures 2.5 x 1.6 x 1.7 cm. There is a trace amount of free fluid in the pelvis. IMPRESSION: 1. Probable early intrauterine gestational sac as detailed above at 5 weeks and 3 days, but no fetal pole or cardiac activity yet visualized. Recommend follow-up quantitative B-HCG levels and follow-up US in 14 days to assess viability. This recommendation follows SRU consensus guidelines: Diagnostic Criteria for Nonviable Pregnancy Early in the First Trimester. Malva Limes Engl J Med 2013; 161:0960-45; 369:1443-51. 2. Moderate subchorionic hemorrhage is noted. Electronically Signed   By: Katherine Mantlehristopher   Green M.D.   On: 03/04/2019 19:13    MAU Course  Procedures  MDM   Assessment and Plan   1. Intrauterine pregnancy   2. Pelvic pain in pregnancy, antepartum, first trimester   3. [redacted] weeks gestation of pregnancy    DC home Comfort measures reviewed  1st Trimester precautions  Bleeding precautions RX: none  Return to MAU as needed FU with OB as planned  Follow-up Information    [facility].   Specialty: Radiology Contact information: [address], [address] [id number] [place] [phone]         Thressa ShellerHeather Athan Casalino DNP, CNM  03/04/19  7:45 PM

## 2019-03-04 NOTE — Discharge Instructions (Signed)
First Trimester of Pregnancy  The first trimester of pregnancy is from week 1 until the end of week 13 (months 1 through 3). A week after a sperm fertilizes an egg, the egg will implant on the wall of the uterus. This embryo will begin to develop into a baby. Genes from you and your partner will form the baby. The female genes will determine whether the baby will be a boy or a girl. At 6-8 weeks, the eyes and face will be formed, and the heartbeat can be seen on ultrasound. At the end of 12 weeks, all the baby's organs will be formed.  Now that you are pregnant, you will want to do everything you can to have a healthy baby. Two of the most important things are to get good prenatal care and to follow your health care provider's instructions. Prenatal care is all the medical care you receive before the baby's birth. This care will help prevent, find, and treat any problems during the pregnancy and childbirth.  Body changes during your first trimester  Your body goes through many changes during pregnancy. The changes vary from woman to woman.   You may gain or lose a couple of pounds at first.   You may feel sick to your stomach (nauseous) and you may throw up (vomit). If the vomiting is uncontrollable, call your health care provider.   You may tire easily.   You may develop headaches that can be relieved by medicines. All medicines should be approved by your health care provider.   You may urinate more often. Painful urination may mean you have a bladder infection.   You may develop heartburn as a result of your pregnancy.   You may develop constipation because certain hormones are causing the muscles that push stool through your intestines to slow down.   You may develop hemorrhoids or swollen veins (varicose veins).   Your breasts may begin to grow larger and become tender. Your nipples may stick out more, and the tissue that surrounds them (areola) may become darker.   Your gums may bleed and may be  sensitive to brushing and flossing.   Dark spots or blotches (chloasma, mask of pregnancy) may develop on your face. This will likely fade after the baby is born.   Your menstrual periods will stop.   You may have a loss of appetite.   You may develop cravings for certain kinds of food.   You may have changes in your emotions from day to day, such as being excited to be pregnant or being concerned that something may go wrong with the pregnancy and baby.   You may have more vivid and strange dreams.   You may have changes in your hair. These can include thickening of your hair, rapid growth, and changes in texture. Some women also have hair loss during or after pregnancy, or hair that feels dry or thin. Your hair will most likely return to normal after your baby is born.  What to expect at prenatal visits  During a routine prenatal visit:   You will be weighed to make sure you and the baby are growing normally.   Your blood pressure will be taken.   Your abdomen will be measured to track your baby's growth.   The fetal heartbeat will be listened to between weeks 10 and 14 of your pregnancy.   Test results from any previous visits will be discussed.  Your health care provider may ask you:     How you are feeling.   If you are feeling the baby move.   If you have had any abnormal symptoms, such as leaking fluid, bleeding, severe headaches, or abdominal cramping.   If you are using any tobacco products, including cigarettes, chewing tobacco, and electronic cigarettes.   If you have any questions.  Other tests that may be performed during your first trimester include:   Blood tests to find your blood type and to check for the presence of any previous infections. The tests will also be used to check for low iron levels (anemia) and protein on red blood cells (Rh antibodies). Depending on your risk factors, or if you previously had diabetes during pregnancy, you may have tests to check for high blood sugar  that affects pregnant women (gestational diabetes).   Urine tests to check for infections, diabetes, or protein in the urine.   An ultrasound to confirm the proper growth and development of the baby.   Fetal screens for spinal cord problems (spina bifida) and Down syndrome.   HIV (human immunodeficiency virus) testing. Routine prenatal testing includes screening for HIV, unless you choose not to have this test.   You may need other tests to make sure you and the baby are doing well.  Follow these instructions at home:  Medicines   Follow your health care provider's instructions regarding medicine use. Specific medicines may be either safe or unsafe to take during pregnancy.   Take a prenatal vitamin that contains at least 600 micrograms (mcg) of folic acid.   If you develop constipation, try taking a stool softener if your health care provider approves.  Eating and drinking     Eat a balanced diet that includes fresh fruits and vegetables, whole grains, good sources of protein such as meat, eggs, or tofu, and low-fat dairy. Your health care provider will help you determine the amount of weight gain that is right for you.   Avoid raw meat and uncooked cheese. These carry germs that can cause birth defects in the baby.   Eating four or five small meals rather than three large meals a day may help relieve nausea and vomiting. If you start to feel nauseous, eating a few soda crackers can be helpful. Drinking liquids between meals, instead of during meals, also seems to help ease nausea and vomiting.   Limit foods that are high in fat and processed sugars, such as fried and sweet foods.   To prevent constipation:  ? Eat foods that are high in fiber, such as fresh fruits and vegetables, whole grains, and beans.  ? Drink enough fluid to keep your urine clear or pale yellow.  Activity   Exercise only as directed by your health care provider. Most women can continue their usual exercise routine during  pregnancy. Try to exercise for 30 minutes at least 5 days a week. Exercising will help you:  ? Control your weight.  ? Stay in shape.  ? Be prepared for labor and delivery.   Experiencing pain or cramping in the lower abdomen or lower back is a good sign that you should stop exercising. Check with your health care provider before continuing with normal exercises.   Try to avoid standing for long periods of time. Move your legs often if you must stand in one place for a long time.   Avoid heavy lifting.   Wear low-heeled shoes and practice good posture.   You may continue to have sex unless your health care   provider tells you not to.  Relieving pain and discomfort   Wear a good support bra to relieve breast tenderness.   Take warm sitz baths to soothe any pain or discomfort caused by hemorrhoids. Use hemorrhoid cream if your health care provider approves.   Rest with your legs elevated if you have leg cramps or low back pain.   If you develop varicose veins in your legs, wear support hose. Elevate your feet for 15 minutes, 3-4 times a day. Limit salt in your diet.  Prenatal care   Schedule your prenatal visits by the twelfth week of pregnancy. They are usually scheduled monthly at first, then more often in the last 2 months before delivery.   Write down your questions. Take them to your prenatal visits.   Keep all your prenatal visits as told by your health care provider. This is important.  Safety   Wear your seat belt at all times when driving.   Make a list of emergency phone numbers, including numbers for family, friends, the hospital, and police and fire departments.  General instructions   Ask your health care provider for a referral to a local prenatal education class. Begin classes no later than the beginning of month 6 of your pregnancy.   Ask for help if you have counseling or nutritional needs during pregnancy. Your health care provider can offer advice or refer you to specialists for help  with various needs.   Do not use hot tubs, steam rooms, or saunas.   Do not douche or use tampons or scented sanitary pads.   Do not cross your legs for long periods of time.   Avoid cat litter boxes and soil used by cats. These carry germs that can cause birth defects in the baby and possibly loss of the fetus by miscarriage or stillbirth.   Avoid all smoking, herbs, alcohol, and medicines not prescribed by your health care provider. Chemicals in these products affect the formation and growth of the baby.   Do not use any products that contain nicotine or tobacco, such as cigarettes and e-cigarettes. If you need help quitting, ask your health care provider. You may receive counseling support and other resources to help you quit.   Schedule a dentist appointment. At home, brush your teeth with a soft toothbrush and be gentle when you floss.  Contact a health care provider if:   You have dizziness.   You have mild pelvic cramps, pelvic pressure, or nagging pain in the abdominal area.   You have persistent nausea, vomiting, or diarrhea.   You have a bad smelling vaginal discharge.   You have pain when you urinate.   You notice increased swelling in your face, hands, legs, or ankles.   You are exposed to fifth disease or chickenpox.   You are exposed to German measles (rubella) and have never had it.  Get help right away if:   You have a fever.   You are leaking fluid from your vagina.   You have spotting or bleeding from your vagina.   You have severe abdominal cramping or pain.   You have rapid weight gain or loss.   You vomit blood or material that looks like coffee grounds.   You develop a severe headache.   You have shortness of breath.   You have any kind of trauma, such as from a fall or a car accident.  Summary   The first trimester of pregnancy is from week 1 until   the end of week 13 (months 1 through 3).   Your body goes through many changes during pregnancy. The changes vary from  woman to woman.   You will have routine prenatal visits. During those visits, your health care provider will examine you, discuss any test results you may have, and talk with you about how you are feeling.  This information is not intended to replace advice given to you by your health care provider. Make sure you discuss any questions you have with your health care provider.  Document Released: 08/28/2001 Document Revised: 08/15/2016 Document Reviewed: 08/15/2016  Elsevier Interactive Patient Education  2019 [employer]

## 2019-03-07 ENCOUNTER — Telehealth: Payer: Self-pay | Admitting: Obstetrics and Gynecology

## 2019-03-07 NOTE — Telephone Encounter (Signed)
OB Telephone Note  Patient called [phone] after receiving inbasket message from [facility] re: questions patient had re: MAU visit. I explained to her re: good signs of a YS excluding ectopic pregnancy and her LMP being approximately 5-6wks ago which lines up with her u/s. I also d/w her normal finding of the CL cyst and the subchorionic hemorrhage. Pt already has 7/2 f/u u/s and I told her I suspect they'll be a viable pregnancy at that time. Pt on hctz and cetirizine and I told her that is fine and she also confirms on a PN vitamin. All questions asked and answered  Durene Romans MD Attending Center for [employer] (Faculty Practice) 03/07/2019 Time: [phone]

## 2019-03-15 ENCOUNTER — Other Ambulatory Visit: Payer: Self-pay | Admitting: Family Medicine

## 2019-03-19 ENCOUNTER — Other Ambulatory Visit: Payer: Self-pay

## 2019-03-19 ENCOUNTER — Ambulatory Visit (INDEPENDENT_AMBULATORY_CARE_PROVIDER_SITE_OTHER): Payer: 59 | Admitting: General Practice

## 2019-03-19 ENCOUNTER — Ambulatory Visit (HOSPITAL_COMMUNITY)
Admission: RE | Admit: 2019-03-19 | Discharge: 2019-03-19 | Disposition: A | Discharge status: Home / Self Care | Payer: 59 | Source: Ambulatory Visit | Attending: Advanced Practice Midwife | Admitting: Advanced Practice Midwife

## 2019-03-19 DIAGNOSIS — R102 Pelvic and perineal pain: Secondary | ICD-10-CM | POA: Insufficient documentation

## 2019-03-19 DIAGNOSIS — Z3A01 Less than 8 weeks gestation of pregnancy: Secondary | ICD-10-CM | POA: Insufficient documentation

## 2019-03-19 DIAGNOSIS — O26891 Other specified pregnancy related conditions, first trimester: Secondary | ICD-10-CM | POA: Diagnosis present

## 2019-03-19 DIAGNOSIS — Z712 Person consulting for explanation of examination or test findings: Secondary | ICD-10-CM

## 2019-03-19 DIAGNOSIS — Z3490 Encounter for supervision of normal pregnancy, unspecified, unspecified trimester: Secondary | ICD-10-CM | POA: Diagnosis present

## 2019-03-19 NOTE — Progress Notes (Signed)
Patient presents to office today for viability ultrasound results. Reviewed results with Dr Rip Harbour who finds living IUP- patient should begin prenatal care.   Informed patient of results & reviewed dating. Patient verbalized understanding and states she has noticed some spotting off and on that goes away within a couple hours. Discussed with patient that can be common in early pregnancy especially after intercourse. Told patient it usually isn't worrisome unless it is associated with pain or becomes heavy like a period. Patient verbalized understanding & will follow up at [facility] office for prenatal care.   Koren Bound RN BSN 03/19/19

## 2019-03-19 NOTE — Progress Notes (Signed)
Agree with A & P. 

## 2019-04-05 ENCOUNTER — Other Ambulatory Visit: Payer: Self-pay | Admitting: Family Medicine

## 2019-04-05 DIAGNOSIS — I10 Essential (primary) hypertension: Secondary | ICD-10-CM

## 2019-04-06 NOTE — Telephone Encounter (Signed)
L.O.V. was 01/16/2019.

## 2019-04-15 ENCOUNTER — Other Ambulatory Visit: Payer: Self-pay

## 2019-04-15 ENCOUNTER — Encounter: Payer: Self-pay | Admitting: Advanced Practice Midwife

## 2019-04-15 ENCOUNTER — Ambulatory Visit (INDEPENDENT_AMBULATORY_CARE_PROVIDER_SITE_OTHER): Payer: 59 | Admitting: Advanced Practice Midwife

## 2019-04-15 ENCOUNTER — Other Ambulatory Visit: Payer: Self-pay | Admitting: Advanced Practice Midwife

## 2019-04-15 VITALS — BP 143/92 | HR 75 | Wt 144.0 lb

## 2019-04-15 DIAGNOSIS — Z3A11 11 weeks gestation of pregnancy: Secondary | ICD-10-CM

## 2019-04-15 DIAGNOSIS — O09891 Supervision of other high risk pregnancies, first trimester: Secondary | ICD-10-CM

## 2019-04-15 DIAGNOSIS — Z113 Encounter for screening for infections with a predominantly sexual mode of transmission: Secondary | ICD-10-CM

## 2019-04-15 DIAGNOSIS — O0990 Supervision of high risk pregnancy, unspecified, unspecified trimester: Secondary | ICD-10-CM | POA: Insufficient documentation

## 2019-04-15 DIAGNOSIS — O09899 Supervision of other high risk pregnancies, unspecified trimester: Secondary | ICD-10-CM | POA: Insufficient documentation

## 2019-04-15 DIAGNOSIS — I10 Essential (primary) hypertension: Secondary | ICD-10-CM

## 2019-04-15 DIAGNOSIS — O0991 Supervision of high risk pregnancy, unspecified, first trimester: Secondary | ICD-10-CM

## 2019-04-15 DIAGNOSIS — O10919 Unspecified pre-existing hypertension complicating pregnancy, unspecified trimester: Secondary | ICD-10-CM

## 2019-04-15 DIAGNOSIS — O10911 Unspecified pre-existing hypertension complicating pregnancy, first trimester: Secondary | ICD-10-CM

## 2019-04-15 MED ORDER — ASPIRIN EC 81 MG PO TBEC: 81.0000 mg | DELAYED_RELEASE_TABLET | Freq: Every day | ORAL | 2 refills | Status: DC

## 2019-04-15 NOTE — Progress Notes (Signed)
History:   Cynthia Crawford is a 28 y.o. G3P2002 at 46w4dby early ultrasound being seen today for her first obstetrical visit.  Her obstetrical history is significant for history of Gestational Hypertension and subsequent diagnosis of Chronic Hypertension. Her previous pregnancy included scheduled IOL for cHTN. Patient does intend to breast feed. Pregnancy history fully reviewed.  Patient reports no complaints. She reports that her PCP discontinued her HCTZ and initiated Labetalol 100 mg BID "a few months ago".    HISTORY: OB History  Gravida Para Term Preterm AB Living  _0 0 0 2  SAB TAB Ectopic Multiple Live Births  0 0 0 0 2    # Outcome Date GA Lbr Len/2nd Weight Sex Delivery Anes PTL Lv  3 Current           2 Term 05/17/18 352w4d2:33 / 00:51 7 lb 0.5 oz (3.19 kg) M Vag-Spont EPI  LIV     Name: Oshana,BOY Jaleesa     Apgar1: 8  Apgar5: 9  1 Term 01/18/16 3728w2d03:20 5 lb 13.5 oz (2.651 kg) F Vag-Spont EPI, Local  LIV     Name: Maule,GIRL Rishita     Apgar1: 8  Apgar5: 9    Last pap smear was done 10/23/2017 and was normal  Past Medical History:  Diagnosis Date  . Asthma   . Chronic hypertension during pregnancy, antepartum 08/29/2017   _1  Aspirin 81 mg daily after 12 weeks; discontinue after 36 weeks Current antihypertensives:  None  Was on Enalapril prior to pregnancy  Baseline and surveillance labs ([facility] needed)  Lab Results Component Value Date  PLT 213 10/16/2017  CREATININE 0.61 10/16/2017  AST 17 10/16/2017  ALT 13 10/16/2017  PROTCRRATIO 0.21 (H) 01/17/2016  PROTEIN24HR 242 (H) 01/16/2016  . Contact dermatitis due to soap 11/20/2017  . History of gestational hypertension   . Kidney stones    Past Surgical History:  Procedure Laterality Date  . TONSILLECTOMY     Family History  Problem Relation Age of Onset  . Diabetes Father   . Asthma Mother    Social History   Tobacco Use  . Smoking status: Never Smoker  .  Smokeless tobacco: Never Used  Substance Use Topics  . Alcohol use: No  . Drug use: No   Allergies  Allergen Reactions  . Penicillins Hives    Has patient had a PCN reaction causing immediate rash, facial/tongue/throat swelling, SOB or lightheadedness with hypotension: Yes Has patient had a PCN reaction causing severe rash involving mucus membranes or skin necrosis: No Has patient had a PCN reaction that required hospitalization No Has patient had a PCN reaction occurring within the last 10 years: No If all of the above answers are "NO", then may proceed with Cephalosporin use.  . Sulfa Antibiotics Hives   Current Outpatient Medications on File Prior to Visit  Medication Sig Dispense Refill  . cetirizine (ZYRTEC) 10 MG tablet Take 1 tablet (10 mg total) by mouth daily. 30 tablet 11  . labetalol (NORMODYNE) 100 MG tablet Take 100 mg by mouth 2 (two) times daily.    . Prenatal Vit-Fe Fumarate-FA (PRENATAL MULTIVITAMIN) TABS tablet Take 1 tablet by mouth at bedtime.    . aMarland Kitchenbuterol (VENTOLIN HFA) 108 (90 Base) MCG/ACT inhaler TAKE 2 PUFFS BY MOUTH EVERY 6 HOURS AS NEEDED FOR WHEEZE OR SHORTNESS OF BREATH (Patient not taking: Reported on 04/15/2019) 18 g 1  . hydrochlorothiazide (HYDRODIURIL)  12.5 MG tablet TAKE 1 TABLET BY MOUTH EVERY DAY (Patient not taking: Reported on 04/15/2019) 90 tablet 1  . triamcinolone ointment (KENALOG) 0.5 % Apply 1 application topically 2 (two) times daily. (Patient not taking: Reported on 04/15/2019) 30 g 2   No current facility-administered medications on file prior to visit.     Review of Systems Pertinent items noted in HPI and remainder of comprehensive ROS otherwise negative. Physical Exam:   Vitals:   04/15/19 0931  BP: (!) 143/92  Pulse: 75  Weight: 144 lb (65.3 kg)   Fetal Heart Rate (bpm): 174 Uterus:     Pelvic Exam: Perineum: no hemorrhoids, normal perineum   Vulva: normal external genitalia, no lesions   Vagina:  normal mucosa, normal  discharge   Cervix: no lesions and normal, pap smear done.    Adnexa: normal adnexa and no mass, fullness, tenderness   Bony Pelvis: average  System: General: well-developed, well-nourished female in no acute distress   Breasts:  normal appearance, no masses or tenderness bilaterally   Skin: normal coloration and turgor, no rashes   Neurologic: oriented, normal, negative, normal mood   Extremities: normal strength, tone, and muscle mass, ROM of all joints is normal   HEENT PERRLA, extraocular movement intact and sclera clear, anicteric   Mouth/Teeth mucous membranes moist, pharynx normal without lesions and dental hygiene good   Neck supple and no masses   Cardiovascular: regular rate and rhythm   Respiratory:  no respiratory distress, normal breath sounds   Abdomen: soft, non-tender; bowel sounds normal; no masses,  no organomegaly    Assessment:    Pregnancy: [id number] Patient Active Problem List   Diagnosis Date Noted  . Supervision of high risk pregnancy, antepartum 04/15/2019  . Short interval between pregnancies affecting pregnancy in first trimester, antepartum 04/15/2019  . Allergic asthma 01/16/2019  . Seasonal allergic rhinitis due to pollen 01/16/2019  . History of kidney stones 01/16/2019  . Intrinsic eczema 08/05/2018  . Stress reaction 08/05/2018  . Essential hypertension 08/29/2017  . Migraine 09/25/2011     Plan:    1. Supervision of high risk pregnancy in first trimester - Continue routine care - Has home cuff. BabyScripts messaged to reactivate account - Obstetric Panel, Including HIV - TSH - Urine Culture - GC/Chlamydia probe amp ([place])not at [facility] - Protein / creatinine ratio, urine - Comp Met (CMET) - [place] MFM OB COMP + 14 WK; Future - HgB A1c  2. Chronic hypertension during pregnancy, antepartum - Previously on Amlodipine, then HCTZ, now Labetalol 100 mg BID, managed by PCP - Reviewed updated recommendations for surveillance and delivery timing  per MFM.  - Problem List - Baseline labs ordered - Rx ASA daily 81 mg beginning at 12 weeks  3. Short Interval Pregnancy --S/p SVD 05/17/2018 --Still nursing 28 year old, predominantly at bedtime/comfort nursing  Initial labs drawn. Continue prenatal vitamins. Genetic Screening discussed, First trimester screen, Quad screen and NIPS: undecided. Patient was charged large out of pocket expense with previous pregnancy and would like to check with her insurance first Ultrasound discussed; fetal anatomic survey: ordered. Problem list reviewed and updated. The nature of [place] with multiple MDs and other Advanced Practice Providers was explained to patient; also emphasized that residents, students are part of our team. Routine obstetric precautions reviewed.  Pt may call to make a lab only appointment for genetic screening,  Return in about 8 weeks (around 06/10/2019), for virtual visit  Mallie Snooks, MSN, CNM Certified Nurse Midwife, [facility] for [employer], [facility] 04/15/19 11:54 AM

## 2019-04-15 NOTE — Patient Instructions (Signed)
First Trimester of Pregnancy  The first trimester of pregnancy is from week 1 until the end of week 13 (months 1 through 3). During this time, your baby will begin to develop inside you. At 6-8 weeks, the eyes and face are formed, and the heartbeat can be seen on ultrasound. At the end of 12 weeks, all the baby's organs are formed. Prenatal care is all the medical care you receive before the birth of your baby. Make sure you get good prenatal care and follow all of your doctor's instructions. Follow these instructions at home: Medicines  Take over-the-counter and prescription medicines only as told by your doctor. Some medicines are safe and some medicines are not safe during pregnancy.  Take a prenatal vitamin that contains at least 600 micrograms (mcg) of folic acid.  If you have trouble pooping (constipation), take medicine that will make your stool soft (stool softener) if your doctor approves. Eating and drinking   Eat regular, healthy meals.  Your doctor will tell you the amount of weight gain that is right for you.  Avoid raw meat and uncooked cheese.  If you feel sick to your stomach (nauseous) or throw up (vomit): ? Eat 4 or 5 small meals a day instead of 3 large meals. ? Try eating a few soda crackers. ? Drink liquids between meals instead of during meals.  To prevent constipation: ? Eat foods that are high in fiber, like fresh fruits and vegetables, whole grains, and beans. ? Drink enough fluids to keep your pee (urine) clear or pale yellow. Activity  Exercise only as told by your doctor. Stop exercising if you have cramps or pain in your lower belly (abdomen) or low back.  Do not exercise if it is too hot, too humid, or if you are in a place of great height (high altitude).  Try to avoid standing for long periods of time. Move your legs often if you must stand in one place for a long time.  Avoid heavy lifting.  Wear low-heeled shoes. Sit and stand up straight.   You can have sex unless your doctor tells you not to. Relieving pain and discomfort  Wear a good support bra if your breasts are sore.  Take warm water baths (sitz baths) to soothe pain or discomfort caused by hemorrhoids. Use hemorrhoid cream if your doctor says it is okay.  Rest with your legs raised if you have leg cramps or low back pain.  If you have puffy, bulging veins (varicose veins) in your legs: ? Wear support hose or compression stockings as told by your doctor. ? Raise (elevate) your feet for 15 minutes, 3-4 times a day. ? Limit salt in your food. Prenatal care  Schedule your prenatal visits by the twelfth week of pregnancy.  Write down your questions. Take them to your prenatal visits.  Keep all your prenatal visits as told by your doctor. This is important. Safety  Wear your seat belt at all times when driving.  Make a list of emergency phone numbers. The list should include numbers for family, friends, the hospital, and police and fire departments. General instructions  Ask your doctor for a referral to a local prenatal class. Begin classes no later than at the start of month 6 of your pregnancy.  Ask for help if you need counseling or if you need help with nutrition. Your doctor can give you advice or tell you where to go for help.  Do not use hot tubs, steam   rooms, or saunas.  Do not douche or use tampons or scented sanitary pads.  Do not cross your legs for long periods of time.  Avoid all herbs and alcohol. Avoid drugs that are not approved by your doctor.  Do not use any tobacco products, including cigarettes, chewing tobacco, and electronic cigarettes. If you need help quitting, ask your doctor. You may get counseling or other support to help you quit.  Avoid cat litter boxes and soil used by cats. These carry germs that can cause birth defects in the baby and can cause a loss of your baby (miscarriage) or stillbirth.  Visit your dentist. At home,  brush your teeth with a soft toothbrush. Be gentle when you floss. Contact a doctor if:  You are dizzy.  You have mild cramps or pressure in your lower belly.  You have a nagging pain in your belly area.  You continue to feel sick to your stomach, you throw up, or you have watery poop (diarrhea).  You have a bad smelling fluid coming from your vagina.  You have pain when you pee (urinate).  You have increased puffiness (swelling) in your face, hands, legs, or ankles. Get help right away if:  You have a fever.  You are leaking fluid from your vagina.  You have spotting or bleeding from your vagina.  You have very bad belly cramping or pain.  You gain or lose weight rapidly.  You throw up blood. It may look like coffee grounds.  You are around people who have German measles, fifth disease, or chickenpox.  You have a very bad headache.  You have shortness of breath.  You have any kind of trauma, such as from a fall or a car accident. Summary  The first trimester of pregnancy is from week 1 until the end of week 13 (months 1 through 3).  To take care of yourself and your unborn baby, you will need to eat healthy meals, take medicines only if your doctor tells you to do so, and do activities that are safe for you and your baby.  Keep all follow-up visits as told by your doctor. This is important as your doctor will have to ensure that your baby is healthy and growing well. This information is not intended to replace advice given to you by your health care provider. Make sure you discuss any questions you have with your health care provider. Document Released: 02/20/2008 Document Revised: 12/25/2018 Document Reviewed: 09/11/2016 Elsevier Patient Education  2020 [employer]  Safe Medications in Pregnancy   Acne: Benzoyl Peroxide Salicylic Acid  Backache/Headache: Tylenol: 2 regular strength every 4 hours OR              2 Extra strength every 6 hours   Colds/Coughs/Allergies: Benadryl (alcohol free) 25 mg every 6 hours as needed Breath right strips Claritin Cepacol throat lozenges Chloraseptic throat spray Cold-Eeze- up to three times per day Cough drops, alcohol free Flonase (by prescription only) Guaifenesin Mucinex Robitussin DM (plain only, alcohol free) Saline nasal spray/drops Sudafed (pseudoephedrine) & Actifed ** use only after [redacted] weeks gestation and if you do not have high blood pressure Tylenol Vicks Vaporub Zinc lozenges Zyrtec   Constipation: Colace Ducolax suppositories Fleet enema Glycerin suppositories Metamucil Milk of magnesia Miralax Senokot Smooth move tea  Diarrhea: Kaopectate Imodium A-D  *NO pepto Bismol  Hemorrhoids: Anusol Anusol HC Preparation H Tucks  Indigestion: Tums Maalox Mylanta Zantac  Pepcid  Insomnia: Benadryl (alcohol free) 25mg every 6   hours as needed Tylenol PM Unisom, no Gelcaps  Leg Cramps: Tums MagGel  Nausea/Vomiting:  Bonine Dramamine Emetrol Ginger extract Sea bands Meclizine  Nausea medication to take during pregnancy:  Unisom (doxylamine succinate 25 mg tablets) Take one tablet daily at bedtime. If symptoms are not adequately controlled, the dose can be increased to a maximum recommended dose of two tablets daily (1/2 tablet in the morning, 1/2 tablet mid-afternoon and one at bedtime). Vitamin B6 100mg tablets. Take one tablet twice a day (up to 200 mg per day).  Skin Rashes: Aveeno products Benadryl cream or 25mg every 6 hours as needed Calamine Lotion 1% cortisone cream  Yeast infection: Gyne-lotrimin 7 Monistat 7   **If taking multiple medications, please check labels to avoid duplicating the same active ingredients **take medication as directed on the label ** Do not exceed 4000 mg of tylenol in 24 hours **Do not take medications that contain aspirin or ibuprofen   

## 2019-04-16 LAB — OBSTETRIC PANEL, INCLUDING HIV
Antibody Screen: NEGATIVE
Basophils Absolute: 0 10*3/uL (ref 0.0–0.2)
Basos: 0 %
EOS (ABSOLUTE): 0.1 10*3/uL (ref 0.0–0.4)
Eos: 1 %
HIV Screen 4th Generation wRfx: NONREACTIVE
Hematocrit: 39 % (ref 34.0–46.6)
Hemoglobin: 12.8 g/dL (ref 11.1–15.9)
Hepatitis B Surface Ag: NEGATIVE
Immature Grans (Abs): 0 10*3/uL (ref 0.0–0.1)
Immature Granulocytes: 0 %
Lymphocytes Absolute: 1.5 10*3/uL (ref 0.7–3.1)
Lymphs: 19 %
MCH: 30 pg (ref 26.6–33.0)
MCHC: 32.8 g/dL (ref 31.5–35.7)
MCV: 91 fL (ref 79–97)
Monocytes Absolute: 0.4 10*3/uL (ref 0.1–0.9)
Monocytes: 5 %
Neutrophils Absolute: 5.6 10*3/uL (ref 1.4–7.0)
Neutrophils: 75 %
Platelets: 175 10*3/uL (ref 150–450)
RBC: 4.27 x10E6/uL (ref 3.77–5.28)
RDW: 12.7 % (ref 11.7–15.4)
RPR Ser Ql: NONREACTIVE
Rh Factor: POSITIVE
Rubella Antibodies, IGG: 3.91 index (ref >0.99)
WBC: 7.6 10*3/uL (ref 3.4–10.8)

## 2019-04-16 LAB — PROTEIN / CREATININE RATIO, URINE
Creatinine, Urine: 192.9 mg/dL
Protein, Ur: 13.8 mg/dL
Protein/Creat Ratio: 72 mg/g creat (ref 0–200)

## 2019-04-16 LAB — COMPREHENSIVE METABOLIC PANEL
ALT: 10 IU/L (ref 0–32)
AST: 15 IU/L (ref 0–40)
Albumin/Globulin Ratio: 1.7 (ref 1.2–2.2)
Albumin: 4.5 g/dL (ref 3.9–5.0)
Alkaline Phosphatase: 44 IU/L (ref 39–117)
BUN/Creatinine Ratio: 15 (ref 9–23)
BUN: 10 mg/dL (ref 6–20)
Bilirubin Total: <0.2 mg/dL (ref 0.0–1.2)
CO2: 20 mmol/L (ref 20–29)
Calcium: 9.4 mg/dL (ref 8.7–10.2)
Chloride: 98 mmol/L (ref 96–106)
Creatinine, Ser: 0.66 mg/dL (ref 0.57–1.00)
GFR calc Af Amer: 139 mL/min/{1.73_m2} (ref >59)
GFR calc non Af Amer: 121 mL/min/{1.73_m2} (ref >59)
Globulin, Total: 2.6 g/dL (ref 1.5–4.5)
Glucose: 67 mg/dL (ref 65–99)
Potassium: 4.1 mmol/L (ref 3.5–5.2)
Sodium: 135 mmol/L (ref 134–144)
Total Protein: 7.1 g/dL (ref 6.0–8.5)

## 2019-04-16 LAB — URINE CULTURE: Organism ID, Bacteria: NO GROWTH

## 2019-04-16 LAB — GC/CHLAMYDIA PROBE AMP (~~LOC~~) NOT AT ARMC
Chlamydia: NEGATIVE
Neisseria Gonorrhea: NEGATIVE

## 2019-04-16 LAB — TSH: TSH: 1.4 u[IU]/mL (ref 0.450–4.500)

## 2019-04-16 LAB — HEMOGLOBIN A1C
Est. average glucose Bld gHb Est-mCnc: 108 mg/dL
Hgb A1c MFr Bld: 5.4 % (ref 4.8–5.6)

## 2019-04-27 ENCOUNTER — Encounter: Payer: Self-pay | Admitting: *Deleted

## 2019-04-27 ENCOUNTER — Other Ambulatory Visit: Payer: Self-pay | Admitting: *Deleted

## 2019-04-27 DIAGNOSIS — O0991 Supervision of high risk pregnancy, unspecified, first trimester: Secondary | ICD-10-CM

## 2019-04-30 ENCOUNTER — Other Ambulatory Visit: Payer: Self-pay | Admitting: Family Medicine

## 2019-04-30 DIAGNOSIS — I10 Essential (primary) hypertension: Secondary | ICD-10-CM

## 2019-05-06 ENCOUNTER — Telehealth (INDEPENDENT_AMBULATORY_CARE_PROVIDER_SITE_OTHER): Payer: 59 | Admitting: Family Medicine

## 2019-05-06 ENCOUNTER — Other Ambulatory Visit: Payer: Self-pay

## 2019-05-06 DIAGNOSIS — O0992 Supervision of high risk pregnancy, unspecified, second trimester: Secondary | ICD-10-CM

## 2019-05-06 DIAGNOSIS — O4692 Antepartum hemorrhage, unspecified, second trimester: Secondary | ICD-10-CM

## 2019-05-06 DIAGNOSIS — O0990 Supervision of high risk pregnancy, unspecified, unspecified trimester: Secondary | ICD-10-CM

## 2019-05-06 DIAGNOSIS — O4690 Antepartum hemorrhage, unspecified, unspecified trimester: Secondary | ICD-10-CM

## 2019-05-06 NOTE — Progress Notes (Signed)
I connected with  Marinna Zykeriah Mathia on 05/06/19 at  3:45 PM EDT by telephone and verified that I am speaking with the correct person using two identifiers.   I discussed the limitations, risks, security and privacy concerns of performing an evaluation and management service by telephone and the availability of in person appointments. I also discussed with the patient that there may be a patient responsible charge related to this service. The patient expressed understanding and agreed to proceed.  Crosby Oyster, RN 05/06/2019  4:36 PM    Had some bleeding yesterday, acted like a start of period. Today has been better, it has been more spotting

## 2019-05-06 NOTE — Progress Notes (Signed)
   TELEHEALTH VIRTUAL OBSTETRICS VISIT ENCOUNTER NOTE  I connected with Cynthia Crawford on 05/06/19 at  3:45 PM EDT by telephone at home and verified that I am speaking with the correct person using two identifiers.   I discussed the limitations, risks, security and privacy concerns of performing an evaluation and management service by telephone and the availability of in person appointments. I also discussed with the patient that there may be a patient responsible charge related to this service. The patient expressed understanding and agreed to proceed.  Subjective:  Cynthia Crawford is a 28 y.o. G3P2002 at [redacted]w[redacted]d being followed for ongoing prenatal care.  She is currently monitored for the following issues for this low-risk pregnancy and has Migraine; Essential hypertension; Intrinsic eczema; Stress reaction; Allergic asthma; Seasonal allergic rhinitis due to pollen; History of kidney stones; Supervision of high risk pregnancy, antepartum; and Short interval between pregnancies affecting pregnancy in first trimester, antepartum on their problem list.  Patient reports bleeding. Reports fetal movement. Denies any contractions, bleeding or leaking of fluid.   Has had brown spotting throughout   Had bright red blood 8/18--not to filling a pad, had only with wiping and when urinating. Denies clots. Denies cramping but reports dull pain in right ovary.   The following portions of the patient's history were reviewed and updated as appropriate: allergies, current medications, past family history, past medical history, past social history, past surgical history and problem list.   Objective:   General:  Alert, oriented and cooperative.   Mental Status: Normal mood and affect perceived. Normal judgment and thought content.  Rest of physical exam deferred due to type of encounter  Assessment and Plan:  Pregnancy: G3P2002 at [redacted]w[redacted]d  1. Supervision of high risk pregnancy, antepartum  Up to date Had IP at 11 wks  2. Vaginal bleeding during pregnancy Minimal bleeding, will have patient come to office tomorrow for FHR   Preterm labor symptoms and general obstetric precautions including but not limited to vaginal bleeding, contractions, leaking of fluid and fetal movement were reviewed in detail with the patient.  I discussed the assessment and treatment plan with the patient. The patient was provided an opportunity to ask questions and all were answered. The patient agreed with the plan and demonstrated an understanding of the instructions. The patient was advised to call back or seek an in-person office evaluation/go to MAU at [facility] for any urgent or concerning symptoms.  Please refer to After Visit Summary for other counseling recommendations.   I provided 15 minutes of non-face-to-face time during this encounter.  Return in about 1 day (around 05/07/2019) for Fetal heart check.  Future Appointments  Date Time Provider [place]  05/07/2019  9:15 AM [facility] NURSE [facility]  06/09/2019  9:30 AM Anyanwu, Sallyanne Havers, MD [facility]  06/11/2019  9:30 AM [facility] [place] 1 [facility]  07/24/2019  9:00 AM Bacigalupo, Dionne Bucy, MD [facility] None    Caren Macadam, [place] for [facility] [place]

## 2019-05-07 ENCOUNTER — Encounter: Payer: Self-pay | Admitting: Family Medicine

## 2019-05-07 ENCOUNTER — Ambulatory Visit (INDEPENDENT_AMBULATORY_CARE_PROVIDER_SITE_OTHER): Payer: 59 | Admitting: *Deleted

## 2019-05-07 VITALS — BP 138/85 | HR 81

## 2019-05-07 DIAGNOSIS — O0990 Supervision of high risk pregnancy, unspecified, unspecified trimester: Secondary | ICD-10-CM

## 2019-05-07 NOTE — Addendum Note (Signed)
Addended by: Caryl Bis on: 05/07/2019 02:58 PM   Modules accepted: Level of Service

## 2019-05-07 NOTE — Progress Notes (Signed)
Attestation of Attending Supervision of clinical support staff: I agree with the care provided to this patient and was available for any consultation.  I have reviewed the RN's note and chart. I was available for consult and to see the patient if needed.   Zacary Bauer Niles Tyrann Donaho, MD, MPH, ABFM Attending Physician Faculty Practice- [facility]  

## 2019-05-07 NOTE — Progress Notes (Signed)
Pt here today to get a FHR due to having some vaginal bleeding earlier this week, Pt states bleeding is better.   FHR 158 via doppler  Discussed with pt is bleeding becomes worse to call office. Pt verbalizes and understands.    Crosby Oyster, RN

## 2019-05-25 ENCOUNTER — Other Ambulatory Visit: Payer: Self-pay | Admitting: Family Medicine

## 2019-05-25 DIAGNOSIS — I10 Essential (primary) hypertension: Secondary | ICD-10-CM

## 2019-05-26 NOTE — Telephone Encounter (Signed)
L.O.V. was 01/16/2019.

## 2019-06-09 ENCOUNTER — Other Ambulatory Visit: Payer: Self-pay

## 2019-06-09 ENCOUNTER — Ambulatory Visit (HOSPITAL_COMMUNITY)
Admission: RE | Admit: 2019-06-09 | Discharge: 2019-06-09 | Disposition: A | Discharge status: Home / Self Care | Payer: 59 | Source: Ambulatory Visit | Attending: Obstetrics and Gynecology | Admitting: Obstetrics and Gynecology

## 2019-06-09 ENCOUNTER — Other Ambulatory Visit (HOSPITAL_COMMUNITY): Payer: Self-pay | Admitting: *Deleted

## 2019-06-09 ENCOUNTER — Encounter: Payer: Self-pay | Admitting: Obstetrics & Gynecology

## 2019-06-09 ENCOUNTER — Telehealth (INDEPENDENT_AMBULATORY_CARE_PROVIDER_SITE_OTHER): Payer: 59 | Admitting: Obstetrics & Gynecology

## 2019-06-09 VITALS — BP 123/78 | Wt 150.0 lb

## 2019-06-09 DIAGNOSIS — Z363 Encounter for antenatal screening for malformations: Secondary | ICD-10-CM

## 2019-06-09 DIAGNOSIS — O0991 Supervision of high risk pregnancy, unspecified, first trimester: Secondary | ICD-10-CM | POA: Diagnosis present

## 2019-06-09 DIAGNOSIS — O10919 Unspecified pre-existing hypertension complicating pregnancy, unspecified trimester: Secondary | ICD-10-CM

## 2019-06-09 DIAGNOSIS — Z3A19 19 weeks gestation of pregnancy: Secondary | ICD-10-CM

## 2019-06-09 DIAGNOSIS — O0992 Supervision of high risk pregnancy, unspecified, second trimester: Secondary | ICD-10-CM

## 2019-06-09 DIAGNOSIS — O10012 Pre-existing essential hypertension complicating pregnancy, second trimester: Secondary | ICD-10-CM

## 2019-06-09 DIAGNOSIS — O0990 Supervision of high risk pregnancy, unspecified, unspecified trimester: Secondary | ICD-10-CM

## 2019-06-09 DIAGNOSIS — O10912 Unspecified pre-existing hypertension complicating pregnancy, second trimester: Secondary | ICD-10-CM

## 2019-06-09 NOTE — Patient Instructions (Signed)
Return to office for any scheduled appointments. Call the office or go to the MAU at [facility] if:  You begin to have strong, frequent contractions  Your water breaks.  Sometimes it is a big gush of fluid, sometimes it is just a trickle that keeps getting your panties wet or running down your legs  You have vaginal bleeding.  It is normal to have a small amount of spotting if your cervix was checked.   You do not feel your baby moving like normal.  If you do not, get something to eat and drink and lay down and focus on feeling your baby move.   If your baby is still not moving like normal, you should call the office or go to MAU.  Any other obstetric concerns.    Second Trimester of Pregnancy The second trimester is from week 14 through week 27 (months 4 through 6). The second trimester is often a time when you feel your best. Your body has adjusted to being pregnant, and you begin to feel better physically. Usually, morning sickness has lessened or quit completely, you may have more energy, and you may have an increase in appetite. The second trimester is also a time when the fetus is growing rapidly. At the end of the sixth month, the fetus is about 9 inches long and weighs about 1 pounds. You will likely begin to feel the baby move (quickening) between 16 and 20 weeks of pregnancy. Body changes during your second trimester Your body continues to go through many changes during your second trimester. The changes vary from woman to woman.  Your weight will continue to increase. You will notice your lower abdomen bulging out.  You may begin to get stretch marks on your hips, abdomen, and breasts.  You may develop headaches that can be relieved by medicines. The medicines should be approved by your health care provider.  You may urinate more often because the fetus is pressing on your bladder.  You may develop or continue to have heartburn as a result of your  pregnancy.  You may develop constipation because certain hormones are causing the muscles that push waste through your intestines to slow down.  You may develop hemorrhoids or swollen, bulging veins (varicose veins).  You may have back pain. This is caused by: ? Weight gain. ? Pregnancy hormones that are relaxing the joints in your pelvis. ? A shift in weight and the muscles that support your balance.  Your breasts will continue to grow and they will continue to become tender.  Your gums may bleed and may be sensitive to brushing and flossing.  Dark spots or blotches (chloasma, mask of pregnancy) may develop on your face. This will likely fade after the baby is born.  A dark line from your belly button to the pubic area (linea nigra) may appear. This will likely fade after the baby is born.  You may have changes in your hair. These can include thickening of your hair, rapid growth, and changes in texture. Some women also have hair loss during or after pregnancy, or hair that feels dry or thin. Your hair will most likely return to normal after your baby is born. What to expect at prenatal visits During a routine prenatal visit:  You will be weighed to make sure you and the fetus are growing normally.  Your blood pressure will be taken.  Your abdomen will be measured to track your baby's growth.    The fetal heartbeat will be listened to.  Any test results from the previous visit will be discussed. Your health care provider may ask you:  How you are feeling.  If you are feeling the baby move.  If you have had any abnormal symptoms, such as leaking fluid, bleeding, severe headaches, or abdominal cramping.  If you are using any tobacco products, including cigarettes, chewing tobacco, and electronic cigarettes.  If you have any questions. Other tests that may be performed during your second trimester include:  Blood tests that check for: ? Low iron levels (anemia). ? High  blood sugar that affects pregnant women (gestational diabetes) between 24 and 28 weeks. ? Rh antibodies. This is to check for a protein on red blood cells (Rh factor).  Urine tests to check for infections, diabetes, or protein in the urine.  An ultrasound to confirm the proper growth and development of the baby.  An amniocentesis to check for possible genetic problems.  Fetal screens for spina bifida and Down syndrome.  HIV (human immunodeficiency virus) testing. Routine prenatal testing includes screening for HIV, unless you choose not to have this test. Follow these instructions at home: Medicines  Follow your health care provider's instructions regarding medicine use. Specific medicines may be either safe or unsafe to take during pregnancy.  Take a prenatal vitamin that contains at least 600 micrograms (mcg) of folic acid.  If you develop constipation, try taking a stool softener if your health care provider approves. Eating and drinking   Eat a balanced diet that includes fresh fruits and vegetables, whole grains, good sources of protein such as meat, eggs, or tofu, and low-fat dairy. Your health care provider will help you determine the amount of weight gain that is right for you.  Avoid raw meat and uncooked cheese. These carry germs that can cause birth defects in the baby.  If you have low calcium intake from food, talk to your health care provider about whether you should take a daily calcium supplement.  Limit foods that are high in fat and processed sugars, such as fried and sweet foods.  To prevent constipation: ? Drink enough fluid to keep your urine clear or pale yellow. ? Eat foods that are high in fiber, such as fresh fruits and vegetables, whole grains, and beans. Activity  Exercise only as directed by your health care provider. Most women can continue their usual exercise routine during pregnancy. Try to exercise for 30 minutes at least 5 days a week. Stop  exercising if you experience uterine contractions.  Avoid heavy lifting, wear low heel shoes, and practice good posture.  A sexual relationship may be continued unless your health care provider directs you otherwise. Relieving pain and discomfort  Wear a good support bra to prevent discomfort from breast tenderness.  Take warm sitz baths to soothe any pain or discomfort caused by hemorrhoids. Use hemorrhoid cream if your health care provider approves.  Rest with your legs elevated if you have leg cramps or low back pain.  If you develop varicose veins, wear support hose. Elevate your feet for 15 minutes, 3-4 times a day. Limit salt in your diet. Prenatal Care  Write down your questions. Take them to your prenatal visits.  Keep all your prenatal visits as told by your health care provider. This is important. Safety  Wear your seat belt at all times when driving.  Make a list of emergency phone numbers, including numbers for family, friends, the hospital, and police and   fire departments. General instructions  Ask your health care provider for a referral to a local prenatal education class. Begin classes no later than the beginning of month 6 of your pregnancy.  Ask for help if you have counseling or nutritional needs during pregnancy. Your health care provider can offer advice or refer you to specialists for help with various needs.  Do not use hot tubs, steam rooms, or saunas.  Do not douche or use tampons or scented sanitary pads.  Do not cross your legs for long periods of time.  Avoid cat litter boxes and soil used by cats. These carry germs that can cause birth defects in the baby and possibly loss of the fetus by miscarriage or stillbirth.  Avoid all smoking, herbs, alcohol, and unprescribed drugs. Chemicals in these products can affect the formation and growth of the baby.  Do not use any products that contain nicotine or tobacco, such as cigarettes and e-cigarettes. If  you need help quitting, ask your health care provider.  Visit your dentist if you have not gone yet during your pregnancy. Use a soft toothbrush to brush your teeth and be gentle when you floss. Contact a health care provider if:  You have dizziness.  You have mild pelvic cramps, pelvic pressure, or nagging pain in the abdominal area.  You have persistent nausea, vomiting, or diarrhea.  You have a bad smelling vaginal discharge.  You have pain when you urinate. Get help right away if:  You have a fever.  You are leaking fluid from your vagina.  You have spotting or bleeding from your vagina.  You have severe abdominal cramping or pain.  You have rapid weight gain or weight loss.  You have shortness of breath with chest pain.  You notice sudden or extreme swelling of your face, hands, ankles, feet, or legs.  You have not felt your baby move in over an hour.  You have severe headaches that do not go away when you take medicine.  You have vision changes. Summary  The second trimester is from week 14 through week 27 (months 4 through 6). It is also a time when the fetus is growing rapidly.  Your body goes through many changes during pregnancy. The changes vary from woman to woman.  Avoid all smoking, herbs, alcohol, and unprescribed drugs. These chemicals affect the formation and growth your baby.  Do not use any tobacco products, such as cigarettes, chewing tobacco, and e-cigarettes. If you need help quitting, ask your health care provider.  Contact your health care provider if you have any questions. Keep all prenatal visits as told by your health care provider. This is important. This information is not intended to replace advice given to you by your health care provider. Make sure you discuss any questions you have with your health care provider. Document Released: 08/28/2001 Document Revised: 12/26/2018 Document Reviewed: 10/09/2016 Elsevier Patient Education  2020  [employer]  

## 2019-06-09 NOTE — Progress Notes (Signed)
TELEHEALTH OBSTETRICS PRENATAL VIRTUAL VIDEO VISIT ENCOUNTER NOTE  Provider location: [facility] [place] at [facility]   I connected with Cynthia Crawford on 06/09/19 at  9:30 AM EDT by MyChart Video Encounter at home and verified that I am speaking with the correct person using two identifiers.   I discussed the limitations, risks, security and privacy concerns of performing an evaluation and management service virtually and the availability of in person appointments. I also discussed with the patient that there may be a patient responsible charge related to this service. The patient expressed understanding and agreed to proceed. Subjective:  Cynthia Crawford is a 29 y.o. G3P2002 at [redacted]w[redacted]d being seen today for ongoing prenatal care.  She is currently monitored for the following issues for this high-risk pregnancy and has Migraine; Chronic hypertension affecting pregnancy; Intrinsic eczema; Allergic asthma; Seasonal allergic rhinitis due to pollen; History of kidney stones; Supervision of high risk pregnancy, antepartum; and Short interval between pregnancies affecting pregnancy, antepartum on their problem list.  Patient reports no complaints.  Contractions: Not present. Vag. Bleeding: None.  Movement: Present. Denies any leaking of fluid.   The following portions of the patient's history were reviewed and updated as appropriate: allergies, current medications, past family history, past medical history, past social history, past surgical history and problem list.   Objective:   Vitals:   06/09/19 0938  BP: 123/78  Weight: 150 lb (68 kg)    Fetal Status:     Movement: Present     General:  Alert, oriented and cooperative. Patient is in no acute distress.  Respiratory: Normal respiratory effort, no problems with respiration noted  Mental Status: Normal mood and affect. Normal behavior. Normal judgment and thought content.  Rest of physical exam deferred due  to type of encounter  Imaging: No results found.  Assessment and Plan:  Pregnancy: G3P2002 at [redacted]w[redacted]d 1. Chronic hypertension affecting pregnancy Stable BP on Labetalol. Continue ASA. Serial scans, then antenatal testing will start at 32 weeks, delivery indicated by 39 weeks.  Continue close monitoring.  2. Supervision of high risk pregnancy, antepartum Declined NIPS due to cost, offered Quad screen, she will get it this week.  Will also get Flu vaccine when she gets the Quad screen draw.  Anatomy scan, will follow up results and manage accordingly. She declined getting lab done today at [facility], wants to make sure Quad screen is covered by her insurance.  - AFP TETRA; Future Preterm labor symptoms and general obstetric precautions including but not limited to vaginal bleeding, contractions, leaking of fluid and fetal movement were reviewed in detail with the patient. I discussed the assessment and treatment plan with the patient. The patient was provided an opportunity to ask questions and all were answered. The patient agreed with the plan and demonstrated an understanding of the instructions. The patient was advised to call back or seek an in-person office evaluation/go to MAU at [facility] for any urgent or concerning symptoms. Please refer to After Visit Summary for other counseling recommendations.   I provided 15 minutes of face-to-face time during this encounter.  Return in about 3 days (around 06/12/2019) for Lab appointment (Quad screen) and RN visit for Flu Vaccine     4 weeks: Virtual OB Visit.  Future Appointments  Date Time Provider [place]  06/09/2019  1:00 PM WH-MFC [place] 3 WH-MFCUS MFC-US  07/24/2019  9:00 AM Bacigalupo, Dionne Bucy, MD BFP-BFP None    Verita Schneiders, MD  [facility] [place]

## 2019-06-11 ENCOUNTER — Ambulatory Visit (HOSPITAL_COMMUNITY): Payer: 59

## 2019-06-12 ENCOUNTER — Other Ambulatory Visit: Payer: Self-pay

## 2019-06-12 ENCOUNTER — Other Ambulatory Visit (INDEPENDENT_AMBULATORY_CARE_PROVIDER_SITE_OTHER): Payer: 59

## 2019-06-12 DIAGNOSIS — O0990 Supervision of high risk pregnancy, unspecified, unspecified trimester: Secondary | ICD-10-CM

## 2019-06-12 DIAGNOSIS — Z23 Encounter for immunization: Secondary | ICD-10-CM

## 2019-06-16 LAB — AFP TETRA
DIA Mom Value: 0.5
DIA Value (EIA): 95.19 pg/mL
DSR (By Age)    1 IN: 805
DSR (Second Trimester) 1 IN: 10000
Gestational Age: 19.9 WEEKS
MSAFP Mom: 1.48
MSAFP: 91.2 ng/mL
MSHCG Mom: 1.06
MSHCG: 25900 m[IU]/mL
Maternal Age At EDD: 28.7 yr
Osb Risk: 5772
T18 (By Age): 1:3134 {titer}
Test Results:: NEGATIVE
Weight: 150 [lb_av]
uE3 Mom: 1.24
uE3 Value: 2.78 ng/mL

## 2019-07-07 ENCOUNTER — Ambulatory Visit (HOSPITAL_COMMUNITY): Payer: 59

## 2019-07-07 ENCOUNTER — Telehealth (INDEPENDENT_AMBULATORY_CARE_PROVIDER_SITE_OTHER): Payer: 59 | Admitting: Family Medicine

## 2019-07-07 ENCOUNTER — Encounter: Payer: Self-pay | Admitting: Family Medicine

## 2019-07-07 ENCOUNTER — Other Ambulatory Visit: Payer: Self-pay

## 2019-07-07 VITALS — BP 134/81

## 2019-07-07 DIAGNOSIS — O09899 Supervision of other high risk pregnancies, unspecified trimester: Secondary | ICD-10-CM

## 2019-07-07 DIAGNOSIS — O0990 Supervision of high risk pregnancy, unspecified, unspecified trimester: Secondary | ICD-10-CM

## 2019-07-07 DIAGNOSIS — O10912 Unspecified pre-existing hypertension complicating pregnancy, second trimester: Secondary | ICD-10-CM

## 2019-07-07 DIAGNOSIS — Z3A23 23 weeks gestation of pregnancy: Secondary | ICD-10-CM

## 2019-07-07 DIAGNOSIS — O09892 Supervision of other high risk pregnancies, second trimester: Secondary | ICD-10-CM

## 2019-07-07 DIAGNOSIS — O0992 Supervision of high risk pregnancy, unspecified, second trimester: Secondary | ICD-10-CM

## 2019-07-07 DIAGNOSIS — O10919 Unspecified pre-existing hypertension complicating pregnancy, unspecified trimester: Secondary | ICD-10-CM

## 2019-07-07 NOTE — Patient Instructions (Signed)

## 2019-07-07 NOTE — Progress Notes (Signed)
TELEHEALTH OBSTETRICS PRENATAL VIRTUAL VIDEO VISIT ENCOUNTER NOTE  Provider location: [facility]   I connected with Fonnie Jarvis on 07/07/19 at 10:30 AM EDT by MyChart Video Encounter at home and verified that I am speaking with the correct person using two identifiers.   I discussed the limitations, risks, security and privacy concerns of performing an evaluation and management service virtually and the availability of in person appointments. I also discussed with the patient that there may be a patient responsible charge related to this service. The patient expressed understanding and agreed to proceed. Subjective:  Cynthia Crawford is a 28 y.o. G3P2002 at [redacted]w[redacted]d being seen today for ongoing prenatal care.  She is currently monitored for the following issues for this high-risk pregnancy and has Migraine; Chronic hypertension affecting pregnancy; Intrinsic eczema; Allergic asthma; Seasonal allergic rhinitis due to pollen; History of kidney stones; Supervision of high risk pregnancy, antepartum; and Short interval between pregnancies affecting pregnancy, antepartum on their problem list.  Patient reports no complaints.  Contractions: Not present.  .  Movement: Present. Denies any leaking of fluid.   The following portions of the patient's history were reviewed and updated as appropriate: allergies, current medications, past family history, past medical history, past social history, past surgical history and problem list.   Objective:   Vitals:   07/07/19 1045  BP: 134/81    Fetal Status:     Movement: Present     General:  Alert, oriented and cooperative. Patient is in no acute distress.  Respiratory: Normal respiratory effort, no problems with respiration noted  Mental Status: Normal mood and affect. Normal behavior. Normal judgment and thought content.  Rest of physical exam deferred due to type of encounter  Imaging: [place] Mfm  Ob Comp + 14 Wk  Result Date: 06/09/2019 ----------------------------------------------------------------------  OBSTETRICS REPORT                       (Signed Final 06/09/2019 05:19 pm) ---------------------------------------------------------------------- Patient Info  ID #:       [id number]                          D.O.B.:  01-23-1991 (28 yrs)  Name:       Cynthia Crawford                   Visit Date: 06/09/2019 01:20 pm              [place] ---------------------------------------------------------------------- Performed By  Performed By:     Sandi Mealy        Referred By:      Barbie Haggis  Attending:        Ma Rings MD         [facility] ---------------------------------------------------------------------- Orders   #  Description                          Code         Ordered By   1  [place] MFM OB COMP + 14 WK               16109.60     Clayton Bibles  ----------------------------------------------------------------------   #  Order #                    Accession #                 Episode #   1  [id number]                  [id number]                  [id number]  ---------------------------------------------------------------------- Indications   Hypertension - Chronic/Pre-existing (          O10.019   Labetalol & ASA)   Encounter for antenatal screening for          Z36.3   malformations ( Declined NIPS)   [redacted] weeks gestation of pregnancy                Z3A.19  ---------------------------------------------------------------------- Fetal Evaluation  Num Of Fetuses:         1  Fetal Heart Rate(bpm):  161  Cardiac Activity:       Observed  Presentation:           Variable  Placenta:               Posterior  P. Cord Insertion:      Visualized  Amniotic Fluid  AFI FV:      Within normal limits                               Largest Pocket(cm)                              5.71 ---------------------------------------------------------------------- Biometry  BPD:      44.8  mm     G. Age:  19w 4d         68  %    CI:        75.82   %    70 - 86                                                          FL/HC:      18.0   %    16.1 - 18.3  HC:      163.1  mm     G. Age:  19w 0d         39  %    HC/AC:  1.14        1.09 - 1.39  AC:      143.3  mm     G. Age:  19w 5d         64  %    FL/BPD:     65.6   %  FL:       29.4  mm     G. Age:  19w 0d         40  %    FL/AC:      20.5   %    20 - 24  HUM:      28.5  mm     G. Age:  19w 1d         52  %  CER:      21.1  mm     G. Age:  20w 1d         70  %  NFT:       5.3  mm  LV:        4.4  mm  CM:        3.5  mm  Est. FW:     288  gm    0 lb 10 oz      58  % ---------------------------------------------------------------------- OB History  Gravidity:    3         Term:   2  Living:       2 ---------------------------------------------------------------------- Gestational Age  LMP:           19w 3d        Date:  01/24/19                 EDD:   10/31/19  U/S Today:     19w 2d                                        EDD:   11/01/19  Best:          19w 1d     Det. By:  Previous Ultrasound      EDD:   11/02/19                                      (03/19/19) ---------------------------------------------------------------------- Anatomy  Cranium:               Appears normal         Aortic Arch:            Appears normal  Cavum:                 Appears normal         Ductal Arch:            Appears normal  Ventricles:            Appears normal         Diaphragm:              Not well visualized  Choroid Plexus:        Appears normal         Stomach:                Appears normal, left  sided  Cerebellum:            Appears normal         Abdomen:                Appears normal  Posterior Fossa:       Appears  normal         Abdominal Wall:         Appears nml (cord                                                                        insert, abd wall)  Nuchal Fold:           Appears normal         Cord Vessels:           Appears normal (3                                                                        vessel cord)  Face:                  Orbits appear          Kidneys:                Appear normal                         normal  Lips:                  Appears normal         Bladder:                Appears normal  Thoracic:              Appears normal         Spine:                  Not well visualized  Heart:                 Not well visualized    Upper Extremities:      Appears normal  RVOT:                  Appears normal         Lower Extremities:      Appears normal  LVOT:                  Not well visualized  Other:  Parents do not wish to know sex of fetus. Technically difficult due to          fetal position. ---------------------------------------------------------------------- Cervix Uterus Adnexa  Cervix  Length:           2.97  cm.  Normal appearance by transabdominal scan.  Uterus  No abnormality visualized.  Left Ovary  Within normal limits.  Right Ovary  Not visualized.  Cul De Sac  No free fluid  seen.  Adnexa  No abnormality visualized. ---------------------------------------------------------------------- Comments  This patient was seen for a detailed fetal anatomy scan due  to a history of chronic hypertension currently treated with  labetalol. She denies any other significant past medical  history and denies any problems in her current pregnancy.  Due to insurance issues, the patient has not had any  screening tests for fetal aneuploidy drawn yet in her current  pregnancy.  She will consider having a quad screen drawn  later this week.  She was informed that the fetal growth and amniotic fluid  level were appropriate for her gestational age.  There were no obvious fetal anomalies noted on  today's  ultrasound exam.  The patient was informed that anomalies may be missed due  to technical limitations. If the fetus is in a suboptimal position  or maternal habitus is increased, visualization of the fetus in  the maternal uterus may be impaired.  The increased risk of superimposed preeclampsia due to  chronic hypertension was discussed.  The increased risk of  fetal growth issues later in her pregnancy was also  discussed.  She was advised that we will continue to follow  her with serial ultrasounds to assess the fetal growth.  Fetal  testing should be started at around 32 weeks.  A follow-up exam was scheduled in 4 weeks. ----------------------------------------------------------------------                   Ma Rings, MD Electronically Signed Final Report   06/09/2019 05:19 pm ----------------------------------------------------------------------   Assessment and Plan:  Pregnancy: Z6X0960 at [redacted]w[redacted]d 1. Chronic hypertension affecting pregnancy Good BP. On labetalol and ASA Has f/u growth scan on Friday this week  2. Short interval between pregnancies affecting pregnancy, antepartum   3. Supervision of high risk pregnancy, antepartum Normal anatomy 28 wk labs next visit.   Preterm labor symptoms and general obstetric precautions including but not limited to vaginal bleeding, contractions, leaking of fluid and fetal movement were reviewed in detail with the patient. I discussed the assessment and treatment plan with the patient. The patient was provided an opportunity to ask questions and all were answered. The patient agreed with the plan and demonstrated an understanding of the instructions. The patient was advised to call back or seek an in-person office evaluation/go to MAU at [facility] for any urgent or concerning symptoms. Please refer to After Visit Summary for other counseling recommendations.   I provided 8 minutes of face-to-face time during this  encounter.  Return in 4 weeks (on 08/04/2019) for in person, 28 wk labs.  Future Appointments  Date Time Provider Department Center  07/10/2019  8:45 AM WH-MFC NURSE WH-MFC MFC-US  07/10/2019  8:45 AM WH-MFC [place] 2 WH-MFCUS MFC-US  07/24/2019  9:00 AM Bacigalupo, Marzella Schlein, MD BFP-BFP None  08/04/2019  8:45 AM Allie Bossier, MD [facility]    Reva Bores, MD [facility] for [facility]

## 2019-07-07 NOTE — Progress Notes (Signed)
I connected with  Sorah Jakaylah Schlafer on 07/07/19 at 10:30 AM EDT by telephone and verified that I am speaking with the correct person using two identifiers.   I discussed the limitations, risks, security and privacy concerns of performing an evaluation and management service by telephone and the availability of in person appointments. I also discussed with the patient that there may be a patient responsible charge related to this service. The patient expressed understanding and agreed to proceed.  Demetrice Jeanella Anton, CMA 07/07/2019  10:17 AM

## 2019-07-10 ENCOUNTER — Encounter (HOSPITAL_COMMUNITY): Payer: Self-pay

## 2019-07-10 ENCOUNTER — Ambulatory Visit (HOSPITAL_COMMUNITY): Payer: 59 | Admitting: *Deleted

## 2019-07-10 ENCOUNTER — Other Ambulatory Visit: Payer: Self-pay

## 2019-07-10 ENCOUNTER — Ambulatory Visit (HOSPITAL_COMMUNITY)
Admission: RE | Admit: 2019-07-10 | Discharge: 2019-07-10 | Disposition: A | Discharge status: Home / Self Care | Payer: 59 | Source: Ambulatory Visit | Attending: Obstetrics | Admitting: Obstetrics

## 2019-07-10 ENCOUNTER — Other Ambulatory Visit (HOSPITAL_COMMUNITY): Payer: Self-pay | Admitting: *Deleted

## 2019-07-10 DIAGNOSIS — O10919 Unspecified pre-existing hypertension complicating pregnancy, unspecified trimester: Secondary | ICD-10-CM

## 2019-07-10 DIAGNOSIS — Z3A23 23 weeks gestation of pregnancy: Secondary | ICD-10-CM | POA: Diagnosis not present

## 2019-07-10 DIAGNOSIS — Z362 Encounter for other antenatal screening follow-up: Secondary | ICD-10-CM | POA: Diagnosis not present

## 2019-07-10 DIAGNOSIS — O10012 Pre-existing essential hypertension complicating pregnancy, second trimester: Secondary | ICD-10-CM

## 2019-07-10 DIAGNOSIS — O10912 Unspecified pre-existing hypertension complicating pregnancy, second trimester: Secondary | ICD-10-CM

## 2019-07-24 ENCOUNTER — Encounter: Payer: 59 | Admitting: Family Medicine

## 2019-08-04 ENCOUNTER — Ambulatory Visit (INDEPENDENT_AMBULATORY_CARE_PROVIDER_SITE_OTHER): Payer: 59 | Admitting: Obstetrics & Gynecology

## 2019-08-04 ENCOUNTER — Other Ambulatory Visit: Payer: Self-pay

## 2019-08-04 VITALS — BP 135/83 | HR 91 | Wt 170.0 lb

## 2019-08-04 DIAGNOSIS — O10912 Unspecified pre-existing hypertension complicating pregnancy, second trimester: Secondary | ICD-10-CM

## 2019-08-04 DIAGNOSIS — Z23 Encounter for immunization: Secondary | ICD-10-CM

## 2019-08-04 DIAGNOSIS — O10919 Unspecified pre-existing hypertension complicating pregnancy, unspecified trimester: Secondary | ICD-10-CM

## 2019-08-04 DIAGNOSIS — O0990 Supervision of high risk pregnancy, unspecified, unspecified trimester: Secondary | ICD-10-CM

## 2019-08-04 DIAGNOSIS — Z3A27 27 weeks gestation of pregnancy: Secondary | ICD-10-CM

## 2019-08-04 DIAGNOSIS — O0992 Supervision of high risk pregnancy, unspecified, second trimester: Secondary | ICD-10-CM

## 2019-08-04 NOTE — Progress Notes (Signed)
   PRENATAL VISIT NOTE  Subjective:  Cynthia Crawford is a 27 y.o. G3P2002 at [redacted]w[redacted]d being seen today for ongoing prenatal care.  She is currently monitored for the following issues for this high-risk pregnancy and has Migraine; Chronic hypertension affecting pregnancy; Intrinsic eczema; Allergic asthma; Seasonal allergic rhinitis due to pollen; History of kidney stones; Supervision of high risk pregnancy, antepartum; and Short interval between pregnancies affecting pregnancy, antepartum on their problem list.  Patient reports no complaints.  Contractions: Irritability. Vag. Bleeding: None.  Movement: Present. Denies leaking of fluid.   The following portions of the patient's history were reviewed and updated as appropriate: allergies, current medications, past family history, past medical history, past social history, past surgical history and problem list.   Objective:   Vitals:   08/04/19 0855  BP: 135/83  Pulse: 91  Weight: 170 lb (77.1 kg)    Fetal Status: Fetal Heart Rate (bpm): 156   Movement: Present     General:  Alert, oriented and cooperative. Patient is in no acute distress.  Skin: Skin is warm and dry. No rash noted.   Cardiovascular: Normal heart rate noted  Respiratory: Normal respiratory effort, no problems with respiration noted  Abdomen: Soft, gravid, appropriate for gestational age.  Pain/Pressure: Absent     Pelvic: Cervical exam deferred        Extremities: Normal range of motion.  Edema: Trace  Mental Status: Normal mood and affect. Normal behavior. Normal judgment and thought content.   Assessment and Plan:  Pregnancy: G3P2002 at [redacted]w[redacted]d 1. Supervision of high risk pregnancy, antepartum  - RPR - HIV antibody (with reflex) - CBC - Glucose Tolerance, 2 Hours w/1 Hour - TDAP today  2. Chronic hypertension affecting pregnancy - on baby asa and labetalol 100 mg BID  Preterm labor symptoms and general obstetric precautions including but not limited to  vaginal bleeding, contractions, leaking of fluid and fetal movement were reviewed in detail with the patient. Please refer to After Visit Summary for other counseling recommendations.   Return in about 2 weeks (around 08/18/2019) for virtual.  Future Appointments  Date Time Provider [place]  08/10/2019  9:45 AM WH-MFC [place] 5 WH-MFCUS MFC-US  08/10/2019  9:50 AM WH-MFC NURSE WH-MFC MFC-US    Emily Filbert, MD

## 2019-08-05 LAB — GLUCOSE TOLERANCE, 2 HOURS W/ 1HR
Glucose, 1 hour: 85 mg/dL (ref 65–179)
Glucose, 2 hour: 78 mg/dL (ref 65–152)
Glucose, Fasting: 70 mg/dL (ref 65–91)

## 2019-08-05 LAB — HIV ANTIBODY (ROUTINE TESTING W REFLEX): HIV Screen 4th Generation wRfx: NONREACTIVE

## 2019-08-05 LAB — CBC
Hematocrit: 35.1 % (ref 34.0–46.6)
Hemoglobin: 11.5 g/dL (ref 11.1–15.9)
MCH: 30.3 pg (ref 26.6–33.0)
MCHC: 32.8 g/dL (ref 31.5–35.7)
MCV: 92 fL (ref 79–97)
Platelets: 145 10*3/uL — ABNORMAL LOW (ref 150–450)
RBC: 3.8 x10E6/uL (ref 3.77–5.28)
RDW: 12.4 % (ref 11.7–15.4)
WBC: 10.2 10*3/uL (ref 3.4–10.8)

## 2019-08-05 LAB — RPR: RPR Ser Ql: NONREACTIVE

## 2019-08-10 ENCOUNTER — Other Ambulatory Visit: Payer: Self-pay

## 2019-08-10 ENCOUNTER — Ambulatory Visit (HOSPITAL_COMMUNITY)
Admission: RE | Admit: 2019-08-10 | Discharge: 2019-08-10 | Disposition: A | Discharge status: Home / Self Care | Payer: 59 | Source: Ambulatory Visit | Attending: Obstetrics and Gynecology | Admitting: Obstetrics and Gynecology

## 2019-08-10 ENCOUNTER — Other Ambulatory Visit (HOSPITAL_COMMUNITY): Payer: Self-pay | Admitting: *Deleted

## 2019-08-10 ENCOUNTER — Ambulatory Visit (HOSPITAL_COMMUNITY): Payer: 59 | Admitting: *Deleted

## 2019-08-10 ENCOUNTER — Encounter (HOSPITAL_COMMUNITY): Payer: Self-pay

## 2019-08-10 DIAGNOSIS — O10013 Pre-existing essential hypertension complicating pregnancy, third trimester: Secondary | ICD-10-CM

## 2019-08-10 DIAGNOSIS — Z362 Encounter for other antenatal screening follow-up: Secondary | ICD-10-CM | POA: Diagnosis not present

## 2019-08-10 DIAGNOSIS — Z3A28 28 weeks gestation of pregnancy: Secondary | ICD-10-CM

## 2019-08-10 DIAGNOSIS — O10919 Unspecified pre-existing hypertension complicating pregnancy, unspecified trimester: Secondary | ICD-10-CM

## 2019-08-10 DIAGNOSIS — O10912 Unspecified pre-existing hypertension complicating pregnancy, second trimester: Secondary | ICD-10-CM | POA: Diagnosis not present

## 2019-08-20 ENCOUNTER — Other Ambulatory Visit: Payer: Self-pay

## 2019-08-20 ENCOUNTER — Telehealth (INDEPENDENT_AMBULATORY_CARE_PROVIDER_SITE_OTHER): Payer: 59 | Admitting: Obstetrics and Gynecology

## 2019-08-20 VITALS — BP 128/76

## 2019-08-20 DIAGNOSIS — O10919 Unspecified pre-existing hypertension complicating pregnancy, unspecified trimester: Secondary | ICD-10-CM

## 2019-08-20 DIAGNOSIS — O10913 Unspecified pre-existing hypertension complicating pregnancy, third trimester: Secondary | ICD-10-CM

## 2019-08-20 DIAGNOSIS — O99113 Other diseases of the blood and blood-forming organs and certain disorders involving the immune mechanism complicating pregnancy, third trimester: Secondary | ICD-10-CM

## 2019-08-20 DIAGNOSIS — O0993 Supervision of high risk pregnancy, unspecified, third trimester: Secondary | ICD-10-CM

## 2019-08-20 DIAGNOSIS — D696 Thrombocytopenia, unspecified: Secondary | ICD-10-CM

## 2019-08-20 DIAGNOSIS — Z3A29 29 weeks gestation of pregnancy: Secondary | ICD-10-CM

## 2019-08-20 DIAGNOSIS — O9913 Other diseases of the blood and blood-forming organs and certain disorders involving the immune mechanism complicating the puerperium: Secondary | ICD-10-CM

## 2019-08-20 DIAGNOSIS — O0990 Supervision of high risk pregnancy, unspecified, unspecified trimester: Secondary | ICD-10-CM

## 2019-08-20 NOTE — Progress Notes (Signed)
I connected with  Cynthia Crawford on 08/20/19 at  1:45 PM EST by telephone and verified that I am speaking with the correct person using two identifiers.   I discussed the limitations, risks, security and privacy concerns of performing an evaluation and management service by telephone and the availability of in person appointments. I also discussed with the patient that there may be a patient responsible charge related to this service. The patient expressed understanding and agreed to proceed.  Crosby Oyster, RN 08/20/2019  1:52 PM

## 2019-08-21 NOTE — Progress Notes (Signed)
Prenatal Visit Note Date: 08/20/2019 [facility]: [facility]  Subjective:  Cynthia Crawford is a 28 y.o. G3P2002 at [redacted]w[redacted]d being seen today for ongoing prenatal care.  She is currently monitored for the following issues for this high-risk pregnancy and has Migraine; Chronic hypertension affecting pregnancy; Benign gestational thrombocytopenia in third trimester ([place]); Intrinsic eczema; Allergic asthma; Seasonal allergic rhinitis due to pollen; History of kidney stones; Supervision of high risk pregnancy, antepartum; and Short interval between pregnancies affecting pregnancy, antepartum on their problem list.  Patient reports no complaints.   Contractions: Not present. Vag. Bleeding: None.  Movement: Present. Denies leaking of fluid.   The following portions of the patient's history were reviewed and updated as appropriate: allergies, current medications, past family history, past medical history, past social history, past surgical history and problem list. Problem list updated.  Objective:   Vitals:   08/20/19 1350  BP: 128/76    Fetal Status:     Movement: Present     General:  Alert, oriented and cooperative. Patient is in no acute distress.  Skin: Skin is warm and dry. No rash noted.   Cardiovascular: Normal heart rate noted  Respiratory: Normal respiratory effort, no problems with respiration noted  Abdomen: Soft, gravid, appropriate for gestational age. Pain/Pressure: Absent     Pelvic:  Cervical exam deferred        Extremities: Normal range of motion.  Edema: Trace  Mental Status: Normal mood and affect. Normal behavior. Normal judgment and thought content.   Urinalysis:      Assessment and Plan:  Pregnancy: G3P2002 at [redacted]w[redacted]d  1. Chronic hypertension affecting pregnancy Doing well on labetalol 100 bid.  Normal growth u/s and already set up for repeat   2. Supervision of high risk pregnancy, antepartum  3. Benign gestational thrombocytopenia in  third trimester ([place]) Rpt plts early jan. Prior h/o this  Preterm labor symptoms and general obstetric precautions including but not limited to vaginal bleeding, contractions, leaking of fluid and fetal movement were reviewed in detail with the patient. Please refer to After Visit Summary for other counseling recommendations.  Return in about 2 weeks (around 09/03/2019) for high risk, virtual visit.   Aletha Halim, MD

## 2019-09-07 ENCOUNTER — Other Ambulatory Visit: Payer: Self-pay

## 2019-09-07 ENCOUNTER — Other Ambulatory Visit (HOSPITAL_COMMUNITY): Payer: Self-pay | Admitting: *Deleted

## 2019-09-07 ENCOUNTER — Encounter (HOSPITAL_COMMUNITY): Payer: Self-pay

## 2019-09-07 ENCOUNTER — Ambulatory Visit (HOSPITAL_COMMUNITY)
Admission: RE | Admit: 2019-09-07 | Discharge: 2019-09-07 | Disposition: A | Discharge status: Home / Self Care | Payer: 59 | Source: Ambulatory Visit | Attending: Obstetrics and Gynecology | Admitting: Obstetrics and Gynecology

## 2019-09-07 ENCOUNTER — Ambulatory Visit (HOSPITAL_COMMUNITY): Payer: 59 | Admitting: *Deleted

## 2019-09-07 DIAGNOSIS — Z3A32 32 weeks gestation of pregnancy: Secondary | ICD-10-CM

## 2019-09-07 DIAGNOSIS — O10919 Unspecified pre-existing hypertension complicating pregnancy, unspecified trimester: Secondary | ICD-10-CM

## 2019-09-07 DIAGNOSIS — O10013 Pre-existing essential hypertension complicating pregnancy, third trimester: Secondary | ICD-10-CM

## 2019-09-07 DIAGNOSIS — Z362 Encounter for other antenatal screening follow-up: Secondary | ICD-10-CM

## 2019-09-07 DIAGNOSIS — O10913 Unspecified pre-existing hypertension complicating pregnancy, third trimester: Secondary | ICD-10-CM

## 2019-09-09 ENCOUNTER — Encounter: Payer: Self-pay | Admitting: Certified Nurse Midwife

## 2019-09-09 ENCOUNTER — Telehealth (INDEPENDENT_AMBULATORY_CARE_PROVIDER_SITE_OTHER): Payer: 59 | Admitting: Certified Nurse Midwife

## 2019-09-09 ENCOUNTER — Other Ambulatory Visit: Payer: Self-pay

## 2019-09-09 DIAGNOSIS — Z3A32 32 weeks gestation of pregnancy: Secondary | ICD-10-CM

## 2019-09-09 DIAGNOSIS — O10919 Unspecified pre-existing hypertension complicating pregnancy, unspecified trimester: Secondary | ICD-10-CM

## 2019-09-09 DIAGNOSIS — O0990 Supervision of high risk pregnancy, unspecified, unspecified trimester: Secondary | ICD-10-CM

## 2019-09-09 DIAGNOSIS — O99113 Other diseases of the blood and blood-forming organs and certain disorders involving the immune mechanism complicating pregnancy, third trimester: Secondary | ICD-10-CM

## 2019-09-09 DIAGNOSIS — O10913 Unspecified pre-existing hypertension complicating pregnancy, third trimester: Secondary | ICD-10-CM

## 2019-09-09 DIAGNOSIS — O0993 Supervision of high risk pregnancy, unspecified, third trimester: Secondary | ICD-10-CM

## 2019-09-09 DIAGNOSIS — D696 Thrombocytopenia, unspecified: Secondary | ICD-10-CM

## 2019-09-09 NOTE — Progress Notes (Signed)
TELEHEALTH OBSTETRICS PRENATAL VIRTUAL VIDEO VISIT ENCOUNTER NOTE  Provider location: [facility]   I connected with Yovana Skyley Grandmaison on 09/09/19 at  9:39 AM EST by MyChart Video Encounter at home and verified that I am speaking with the correct person using two identifiers.   I discussed the limitations, risks, security and privacy concerns of performing an evaluation and management service virtually and the availability of in person appointments. I also discussed with the patient that there may be a patient responsible charge related to this service. The patient expressed understanding and agreed to proceed. Subjective:  Cynthia Crawford is a 28 y.o. G3P2002 at [redacted]w[redacted]d being seen today for ongoing prenatal care.  She is currently monitored for the following issues for this high-risk pregnancy and has Migraine; Chronic hypertension affecting pregnancy; Benign gestational thrombocytopenia in third trimester (HCC); Intrinsic eczema; Allergic asthma; Seasonal allergic rhinitis due to pollen; History of kidney stones; Supervision of high risk pregnancy, antepartum; and Short interval between pregnancies affecting pregnancy, antepartum on their problem list.  Patient reports no complaints.  Contractions: Not present. Vag. Bleeding: None.  Movement: Present. Denies any leaking of fluid.   The following portions of the patient's history were reviewed and updated as appropriate: allergies, current medications, past family history, past medical history, past social history, past surgical history and problem list.   Objective:  There were no vitals filed for this visit.  Fetal Status:     Movement: Present     General:  Alert, oriented and cooperative. Patient is in no acute distress.  Respiratory: Normal respiratory effort, no problems with respiration noted  Mental Status: Normal mood and affect. Normal behavior. Normal judgment and thought content.    Rest of physical exam deferred due to type of encounter  Imaging: [place] MFM FETAL BPP WO NON STRESS  Result Date: 09/07/2019 ----------------------------------------------------------------------  OBSTETRICS REPORT                       (Signed Final 09/07/2019 10:19 am) ---------------------------------------------------------------------- Patient Info  ID #:       [id number]                          D.O.B.:  1991-01-02 (28 yrs)  Name:       Cynthia Crawford                   Visit Date: 09/07/2019 09:32 am              [place] ---------------------------------------------------------------------- Performed By  Performed By:     Emeline Darling BS,      Referred By:      Barbie Haggis  Attending:        Ma Rings MD         [facility] ---------------------------------------------------------------------- Orders   #  Description                          Code         Ordered By   1  [place] MFM OB FOLLOW UP                  76816.01     RAVI SHANKAR   2  [place] MFM FETAL BPP WO NON              76819.01     RAVI [facility]      STRESS  ----------------------------------------------------------------------   #  Order #                    Accession #                 Episode #   1  [id number]                  [id number]                  [id number]   2  [id number]                  [id number]                  [id number]  ---------------------------------------------------------------------- Indications   Hypertension - Chronic/Pre-existing (          O10.019   Labetalol & ASA)   Encounter for other antenatal screening        Z36.2   follow-up (Genetic Declined, AFP pending)   Umbilical vein abnormality complicating        O69.9XX0   pregnancy (PRUV)   [redacted] weeks gestation of pregnancy                Z3A.32  ----------------------------------------------------------------------  Fetal Evaluation  Num Of Fetuses:         1  Fetal Heart Rate(bpm):  142  Cardiac Activity:       Observed  Presentation:           Cephalic  Placenta:               Posterior  P. Cord Insertion:      Previously Visualized  Amniotic Fluid  AFI FV:      Within normal limits  AFI Sum(cm)     %Tile       Largest Pocket(cm)  16.99           62          5.73  RUQ(cm)       RLQ(cm)       LUQ(cm)        LLQ(cm)  5.73          4.19          2.91           4.16 ---------------------------------------------------------------------- Biophysical Evaluation  Amniotic F.V:   Pocket => 2 cm             F. Tone:        Observed  F. Movement:    Observed                   Score:          8/8  F. Breathing:   Observed ---------------------------------------------------------------------- Biometry  BPD:  78  mm     G. Age:  31w 2d         21  %    CI:        76.71   %    70 - 86                                                          FL/HC:      20.9   %    19.1 - 21.3  HC:      282.1  mm     G. Age:  30w 6d          3  %    HC/AC:      1.01        0.96 - 1.17  AC:      279.7  mm     G. Age:  32w 0d         49  %    FL/BPD:     75.5   %    71 - 87  FL:       58.9  mm     G. Age:  30w 5d         10  %    FL/AC:      21.1   %    20 - 24  Est. FW:    1771  gm    3 lb 14 oz      23  % ---------------------------------------------------------------------- OB History  Gravidity:    3         Term:   2  Living:       2 ---------------------------------------------------------------------- Gestational Age  LMP:           32w 2d        Date:  01/24/19                 EDD:   10/31/19  U/S Today:     31w 2d                                        EDD:   11/07/19  Best:          32w 0d     Det. By:  Previous Ultrasound      EDD:   11/02/19                                      (03/19/19) ---------------------------------------------------------------------- Anatomy  Cranium:               Appears normal         Aortic Arch:             Previously seen  Cavum:                 Previously seen        Ductal Arch:            Previously seen  Ventricles:            Appears normal         Diaphragm:  Previously seen  Choroid Plexus:        Previously seen        Stomach:                Appears normal, left                                                                        sided  Cerebellum:            Previously seen        Abdomen:                Persistent right                                                                        umbilical vein  Posterior Fossa:       Previously seen        Abdominal Wall:         Previously seen  Nuchal Fold:           Previously seen        Cord Vessels:           Previously seen  Face:                  Orbits and profile     Kidneys:                Appear normal                         previously seen  Lips:                  Appears normal         Bladder:                Appears normal  Thoracic:              Appears normal         Spine:                  Not well visualized  Heart:                 Appears normal         Upper Extremities:      Previously seen                         (4CH, axis, and                         situs)  RVOT:                  Appears normal         Lower Extremities:      Previously seen  LVOT:  Appears normal  Other:  Parents do not wish to know sex of fetus. 3VV previously visualized. ---------------------------------------------------------------------- Cervix Uterus Adnexa  Cervix  Not visualized (advanced GA >24wks) ---------------------------------------------------------------------- Comments  This patient was seen for a follow up growth scan due to  chronic hypertension that is currently treated with labetalol  100 mg twice a day.  She denies any problems since her last  exam.  She was informed that the fetal growth and amniotic fluid  level appears appropriate for her gestational age.  A biophysical profile performed today was 8 out of 8.  Due  to chronic hypertension, we will continue to follow her  with weekly fetal testing.  A follow-up growth scan was  scheduled in 4 weeks. ----------------------------------------------------------------------                   Ma Rings, MD Electronically Signed Final Report   09/07/2019 10:19 am ----------------------------------------------------------------------  [place] MFM OB FOLLOW UP  Result Date: 09/07/2019 ----------------------------------------------------------------------  OBSTETRICS REPORT                       (Signed Final 09/07/2019 10:19 am) ---------------------------------------------------------------------- Patient Info  ID #:       [id number]                          D.O.B.:  1991-03-29 (28 yrs)  Name:       Cynthia Crawford                   Visit Date: 09/07/2019 09:32 am              [place] ---------------------------------------------------------------------- Performed By  Performed By:     Emeline Darling BS,      Referred By:      Barbie Haggis  Attending:        Ma Rings MD         [facility] ---------------------------------------------------------------------- Orders   #  Description                          Code         Ordered By   1  [place] MFM OB FOLLOW UP                  40981.19     RAVI SHANKAR   2  [place] MFM FETAL BPP WO NON              14782.95     RAVI SHANKAR      STRESS  ----------------------------------------------------------------------   #  Order #  Accession #                 Episode #   1  [id number]                  [id number]                  [id number]   2  [id number]                  [id number]                  [id number]  ---------------------------------------------------------------------- Indications   Hypertension - Chronic/Pre-existing (          O10.019   Labetalol & ASA)   Encounter for  other antenatal screening        Z36.2   follow-up (Genetic Declined, AFP pending)   Umbilical vein abnormality complicating        O69.9XX0   pregnancy (PRUV)   [redacted] weeks gestation of pregnancy                Z3A.32  ---------------------------------------------------------------------- Fetal Evaluation  Num Of Fetuses:         1  Fetal Heart Rate(bpm):  142  Cardiac Activity:       Observed  Presentation:           Cephalic  Placenta:               Posterior  P. Cord Insertion:      Previously Visualized  Amniotic Fluid  AFI FV:      Within normal limits  AFI Sum(cm)     %Tile       Largest Pocket(cm)  16.99           62          5.73  RUQ(cm)       RLQ(cm)       LUQ(cm)        LLQ(cm)  5.73          4.19          2.91           4.16 ---------------------------------------------------------------------- Biophysical Evaluation  Amniotic F.V:   Pocket => 2 cm             F. Tone:        Observed  F. Movement:    Observed                   Score:          8/8  F. Breathing:   Observed ---------------------------------------------------------------------- Biometry  BPD:        78  mm     G. Age:  31w 2d         21  %    CI:        76.71   %    70 - 86                                                          FL/HC:      20.9   %    19.1 - 21.3  HC:      282.1  mm     G. Age:  30w 6d  3  %    HC/AC:      1.01        0.96 - 1.17  AC:      279.7  mm     G. Age:  32w 0d         49  %    FL/BPD:     75.5   %    71 - 87  FL:       58.9  mm     G. Age:  30w 5d         10  %    FL/AC:      21.1   %    20 - 24  Est. FW:    1771  gm    3 lb 14 oz      23  % ---------------------------------------------------------------------- OB History  Gravidity:    3         Term:   2  Living:       2 ---------------------------------------------------------------------- Gestational Age  LMP:           32w 2d        Date:  01/24/19                 EDD:   10/31/19  U/S Today:     31w 2d                                        EDD:    11/07/19  Best:          32w 0d     Det. By:  Previous Ultrasound      EDD:   11/02/19                                      (03/19/19) ---------------------------------------------------------------------- Anatomy  Cranium:               Appears normal         Aortic Arch:            Previously seen  Cavum:                 Previously seen        Ductal Arch:            Previously seen  Ventricles:            Appears normal         Diaphragm:              Previously seen  Choroid Plexus:        Previously seen        Stomach:                Appears normal, left                                                                        sided  Cerebellum:            Previously seen  Abdomen:                Persistent right                                                                        umbilical vein  Posterior Fossa:       Previously seen        Abdominal Wall:         Previously seen  Nuchal Fold:           Previously seen        Cord Vessels:           Previously seen  Face:                  Orbits and profile     Kidneys:                Appear normal                         previously seen  Lips:                  Appears normal         Bladder:                Appears normal  Thoracic:              Appears normal         Spine:                  Not well visualized  Heart:                 Appears normal         Upper Extremities:      Previously seen                         (4CH, axis, and                         situs)  RVOT:                  Appears normal         Lower Extremities:      Previously seen  LVOT:                  Appears normal  Other:  Parents do not wish to know sex of fetus. 3VV previously visualized. ---------------------------------------------------------------------- Cervix Uterus Adnexa  Cervix  Not visualized (advanced GA >24wks) ---------------------------------------------------------------------- Comments  This patient was seen for a follow up growth scan due to  chronic  hypertension that is currently treated with labetalol  100 mg twice a day.  She denies any problems since her last  exam.  She was informed that the fetal growth and amniotic fluid  level appears appropriate for her gestational age.  A biophysical profile performed today was 8 out of 8.  Due to chronic hypertension, we will continue to follow her  with weekly fetal testing.  A follow-up growth scan was  scheduled in 4 weeks. ----------------------------------------------------------------------  Ma RingsVictor Fang, MD Electronically Signed Final Report   09/07/2019 10:19 am ----------------------------------------------------------------------  US MFM OB FOLLOW UP  Result Date: 08/10/2019 ----------------------------------------------------------------------  OBSTETRICS REPORT                       (Signed Final 08/10/2019 11:59 am) ---------------------------------------------------------------------- Patient Info  ID #:       [id number]                          D.O.B.:  1991/07/06 (28 yrs)  Name:       Cynthia ReinHASITY HARGR                   Visit Date: 08/10/2019 10:15 am              [place] ---------------------------------------------------------------------- Performed By  Performed By:     Tomma Lightningevin Vics             Referred By:      Roxy CedarSAMANTHA C                    RDMS,RVT                                 [place]  Attending:        Noralee Spaceavi Shankar MD        [facility] ---------------------------------------------------------------------- Orders   #  Description                          Code         Ordered By   1  US MFM OB FOLLOW UP                  72536.6476816.01     Noralee SpaceAVI SHANKAR  ----------------------------------------------------------------------   #  Order #                    Accession #                 Episode #   1  [id number]                  [id number]   ---------------------------------------------------------------------- Indications   Hypertension - Chronic/Pre-existing (          O10.019   Labetalol & ASA)   [redacted] weeks gestation of pregnancy                Z3A.28   Encounter for other antenatal screening        Z36.2   follow-up (Genetic Declined, AFP pending)   Umbilical vein abnormality complicating        O69.9XX0   pregnancy (PRUV)  ---------------------------------------------------------------------- Fetal Evaluation  Num Of Fetuses:         1  Fetal Heart Rate(bpm):  157  Cardiac  Activity:       Observed  Presentation:           Cephalic  Placenta:               Posterior  P. Cord Insertion:      Previously Visualized  Amniotic Fluid  AFI FV:      Within normal limits  AFI Sum(cm)     %Tile       Largest Pocket(cm)  14.47           49          5.89  RUQ(cm)       RLQ(cm)       LUQ(cm)        LLQ(cm)  5.81          1.72          5.89           1.05 ---------------------------------------------------------------------- Biometry  BPD:      67.7  mm     G. Age:  27w 2d         17  %    CI:        71.04   %    70 - 86                                                          FL/HC:      19.2   %    18.8 - 20.6  HC:      255.9  mm     G. Age:  27w 6d         15  %    HC/AC:      1.04        1.05 - 1.21  AC:      246.9  mm     G. Age:  28w 6d         71  %    FL/BPD:     72.5   %    71 - 87  FL:       49.1  mm     G. Age:  26w 4d          6  %    FL/AC:      19.9   %    20 - 24  HUM:      45.4  mm     G. Age:  26w 6d         21  %  LV:        4.8  mm  Est. FW:    1144  gm      2 lb 8 oz     33  % ---------------------------------------------------------------------- OB History  Gravidity:    3         Term:   2  Living:       2 ---------------------------------------------------------------------- Gestational Age  LMP:           28w 2d        Date:  01/24/19                 EDD:   10/31/19  U/S Today:     27w 5d  EDD:    11/04/19  Best:          Eden Emms 0d     Det. By:  Previous Ultrasound      EDD:   11/02/19                                      (03/19/19) ---------------------------------------------------------------------- Anatomy  Cranium:               Appears normal         Aortic Arch:            Appears normal  Cavum:                 Appears normal         Ductal Arch:            Previously seen  Ventricles:            Appears normal         Diaphragm:              Appears normal  Choroid Plexus:        Previously seen        Stomach:                Appears normal, left                                                                        sided  Cerebellum:            Previously seen        Abdomen:                Persistent right                                                                        umbilical vein  Posterior Fossa:       Previously seen        Abdominal Wall:         Previously seen  Nuchal Fold:           Previously seen        Cord Vessels:           Appears normal (3                                                                        vessel cord)  Face:                  Appears normal         Kidneys:  Appear normal                         (orbits and profile)  Lips:                  Appears normal         Bladder:                Appears normal  Thoracic:              Appears normal         Spine:                  Appears normal  Heart:                 Appears normal         Upper Extremities:      Previously seen                         (4CH, axis, and                         situs)  RVOT:                  Previously seen        Lower Extremities:      Previously seen  LVOT:                  Appears normal  Other:  Parents do not wish to know sex of fetus. 3VV visualized. ---------------------------------------------------------------------- Cervix Uterus Adnexa  Cervix  Length:           4.45  cm.  Normal appearance by transabdominal scan.  Uterus  No abnormality visualized.  Left Ovary   Size(cm)       3.1  x   1.5    x  1.8       Vol(ml): 4.38  Within normal limits.  Right Ovary  Size(cm)       3.6  x   2.1    x  2         Vol(ml): 7.92  Within normal limits. ---------------------------------------------------------------------- Impression  Chronic hypertension. Well-controlled on labetalol.  BP at our office: 126/73 mm Hg.  Fetal growth is appropriate for gestational age. Umbilical vein  seems to be curved toward the stomach raising the possibility  of persistent right umbilical vein (PRUV) and it seems to have  intrahepatic. Gall bladder is seen medial to the vein.  I explained the finding of PRUV and reassured her that in the  absence of associated anomalies (including cardiac) or fetal  growth restriction, good fetal outcome is expected. Two  umbilical arteries are present.  We will continue to monitor fetal growth. ---------------------------------------------------------------------- Recommendations  -An appointment was made for her to return in 4 weeks for  fetal growth assessment.  -Weekly BPP from next visit. ----------------------------------------------------------------------                  Noralee Space, MD Electronically Signed Final Report   08/10/2019 11:59 am ----------------------------------------------------------------------   Assessment and Plan:  Pregnancy: [id number] at [redacted]w[redacted]d 1. Supervision of high risk pregnancy, antepartum - Patient doing well, no complaints - Anticipatory guidance on upcoming appointments - Routine prenatal care   2. Chronic hypertension affecting pregnancy - BP stable on labetalol and continue bASA  - Continue as scheduled  for appointments with MFM   3. Benign gestational thrombocytopenia in third trimester [facility]) - Recheck plts early January  - In office appointment    Preterm labor symptoms and general obstetric precautions including but not limited to vaginal bleeding, contractions, leaking of fluid and fetal movement were reviewed in  detail with the patient. I discussed the assessment and treatment plan with the patient. The patient was provided an opportunity to ask questions and all were answered. The patient agreed with the plan and demonstrated an understanding of the instructions. The patient was advised to call back or seek an in-person office evaluation/go to [facility] at [facility] for any urgent or concerning symptoms. Please refer to After Visit Summary for other counseling recommendations.   I provided 10 minutes of face-to-face time during this encounter.  Return in about 2 weeks (around 09/23/2019) for ROB-recheck plt levels in office.  Future Appointments  Date Time Provider Department Center  09/15/2019  7:30 AM [facility]MFC NURSE WH-MFC [facility]-US  09/15/2019  7:30 AM WH-MFC [place] 1 WH-MFCUS MFC-US  09/22/2019 10:00 AM [facility]-MFC [place] 3 WH-MFCUS MFC-US  09/22/2019 10:10 AM WH-MFC NURSE WH-MFC MFC-US  09/23/2019 11:15 AM Calvert Cantor, CNM [facility]  09/29/2019  9:45 AM WH-MFC NURSE WH-MFC MFC-US  09/29/2019  9:45 AM WH-MFC [place] 4 WH-MFCUS MFC-US  10/06/2019  9:30 AM WH-MFC NURSE WH-MFC MFC-US  10/06/2019  9:30 AM WH-MFC [place] 5 WH-MFCUS MFC-US    Sharyon Cable, CNM [facility] for [employer], [facility]

## 2019-09-09 NOTE — Progress Notes (Signed)
I connected with  Cynthia Crawford on 09/09/19 at  9:30 AM EST by telephone and verified that I am speaking with the correct person using two identifiers.   I discussed the limitations, risks, security and privacy concerns of performing an evaluation and management service by telephone and the availability of in person appointments. I also discussed with the patient that there may be a patient responsible charge related to this service. The patient expressed understanding and agreed to proceed.  Crosby Oyster, RN 09/09/2019  9:38 AM

## 2019-09-15 ENCOUNTER — Other Ambulatory Visit: Payer: Self-pay

## 2019-09-15 ENCOUNTER — Ambulatory Visit (HOSPITAL_COMMUNITY)
Admission: RE | Admit: 2019-09-15 | Discharge: 2019-09-15 | Disposition: A | Discharge status: Home / Self Care | Payer: 59 | Source: Ambulatory Visit | Attending: Obstetrics | Admitting: Obstetrics

## 2019-09-15 ENCOUNTER — Encounter (HOSPITAL_COMMUNITY): Payer: Self-pay

## 2019-09-15 ENCOUNTER — Ambulatory Visit (HOSPITAL_COMMUNITY): Payer: 59 | Admitting: *Deleted

## 2019-09-15 DIAGNOSIS — O10913 Unspecified pre-existing hypertension complicating pregnancy, third trimester: Secondary | ICD-10-CM | POA: Insufficient documentation

## 2019-09-15 DIAGNOSIS — Z3A33 33 weeks gestation of pregnancy: Secondary | ICD-10-CM | POA: Diagnosis not present

## 2019-09-15 DIAGNOSIS — O10919 Unspecified pre-existing hypertension complicating pregnancy, unspecified trimester: Secondary | ICD-10-CM | POA: Insufficient documentation

## 2019-09-18 NOTE — L&D Delivery Note (Signed)
Tommie Shamiyah Ngu is a 29 y.o. female G3P2002 with IUP at [redacted]w[redacted]d admitted for IOL for [facility] .  She progressed with augmentation via FB, cytotec, and pitocin to complete and pushed 20 minutes to deliver.  Cord clamping delayed by several minutes then clamped by CNM and cut by FOB.    Delivery Note At 8:36 AM a viable female was delivered via Vaginal, Spontaneous (Presentation: Left Occiput Anterior).  APGAR: 8, 9; weight pending.   Placenta status: spontaneous, intact.  Cord: 3 vessels  Anesthesia: Epidural Episiotomy:  n/a Lacerations:  1st degree, hemostatic and unrepaired Suture Repair:  n/a Est. Blood Loss (mL):  50  Mom to postpartum.  Baby to Couplet care / Skin to Skin.  Rolm Bookbinder CNM 10/24/2019, 8:45 AM

## 2019-09-22 ENCOUNTER — Other Ambulatory Visit: Payer: Self-pay

## 2019-09-22 ENCOUNTER — Ambulatory Visit (HOSPITAL_COMMUNITY)
Admission: RE | Admit: 2019-09-22 | Discharge: 2019-09-22 | Disposition: A | Discharge status: Home / Self Care | Payer: 59 | Source: Ambulatory Visit | Attending: Obstetrics | Admitting: Obstetrics

## 2019-09-22 ENCOUNTER — Ambulatory Visit (HOSPITAL_COMMUNITY): Payer: 59 | Admitting: *Deleted

## 2019-09-22 ENCOUNTER — Encounter (HOSPITAL_COMMUNITY): Payer: Self-pay

## 2019-09-22 DIAGNOSIS — Z3A34 34 weeks gestation of pregnancy: Secondary | ICD-10-CM

## 2019-09-22 DIAGNOSIS — O10919 Unspecified pre-existing hypertension complicating pregnancy, unspecified trimester: Secondary | ICD-10-CM | POA: Diagnosis present

## 2019-09-22 DIAGNOSIS — O10913 Unspecified pre-existing hypertension complicating pregnancy, third trimester: Secondary | ICD-10-CM | POA: Diagnosis present

## 2019-09-22 DIAGNOSIS — O10013 Pre-existing essential hypertension complicating pregnancy, third trimester: Secondary | ICD-10-CM | POA: Diagnosis not present

## 2019-09-23 ENCOUNTER — Ambulatory Visit (INDEPENDENT_AMBULATORY_CARE_PROVIDER_SITE_OTHER): Payer: 59 | Admitting: Advanced Practice Midwife

## 2019-09-23 VITALS — BP 147/86 | HR 72 | Wt 182.0 lb

## 2019-09-23 DIAGNOSIS — O0993 Supervision of high risk pregnancy, unspecified, third trimester: Secondary | ICD-10-CM

## 2019-09-23 DIAGNOSIS — O10913 Unspecified pre-existing hypertension complicating pregnancy, third trimester: Secondary | ICD-10-CM

## 2019-09-23 DIAGNOSIS — D696 Thrombocytopenia, unspecified: Secondary | ICD-10-CM

## 2019-09-23 DIAGNOSIS — O0990 Supervision of high risk pregnancy, unspecified, unspecified trimester: Secondary | ICD-10-CM

## 2019-09-23 DIAGNOSIS — O10919 Unspecified pre-existing hypertension complicating pregnancy, unspecified trimester: Secondary | ICD-10-CM

## 2019-09-23 DIAGNOSIS — O99113 Other diseases of the blood and blood-forming organs and certain disorders involving the immune mechanism complicating pregnancy, third trimester: Secondary | ICD-10-CM

## 2019-09-23 DIAGNOSIS — Z3A34 34 weeks gestation of pregnancy: Secondary | ICD-10-CM

## 2019-09-23 NOTE — Patient Instructions (Signed)

## 2019-09-23 NOTE — Progress Notes (Signed)
   PRENATAL VISIT NOTE  Subjective:  Cynthia Crawford is a 29 y.o. G3P2002 at [redacted]w[redacted]d being seen today for ongoing prenatal care.  She is currently monitored for the following issues for this high-risk pregnancy and has Migraine; Chronic hypertension affecting pregnancy; Benign gestational thrombocytopenia in third trimester (HCC); Intrinsic eczema; Allergic asthma; Seasonal allergic rhinitis due to pollen; History of kidney stones; Supervision of high risk pregnancy, antepartum; and Short interval between pregnancies affecting pregnancy, antepartum on their problem list.  Patient reports no complaints. She reports Braxton Hicks contractions off and on yesterday which resolved without intervention Contractions: Irritability. Movement: Present. Denies leaking of fluid.   Patient's wedding ring became stuck on her finger and she had to call EMS to cut her ring off. She reports residual soreness and a healing scrape on her left ring finger.  The following portions of the patient's history were reviewed and updated as appropriate: allergies, current medications, past family history, past medical history, past social history, past surgical history and problem list. Problem list updated.  Objective:   Vitals:   09/23/19 1120  BP: (!) 147/86  Pulse: 72  Weight: 182 lb (82.6 kg)    Fetal Status: Fetal Heart Rate (bpm): 134   Movement: Present     General:  Alert, oriented and cooperative. Patient is in no acute distress.  Skin: Skin is warm and dry. No rash noted.   Cardiovascular: Normal heart rate noted  Respiratory: Normal respiratory effort, no problems with respiration noted  Abdomen: Soft, gravid, appropriate for gestational age.  Pain/Pressure: Present     Pelvic: Cervical exam performed        Extremities: Normal range of motion.  Edema: Mild pitting, slight indentation  Mental Status: Normal mood and affect. Normal behavior. Normal judgment and thought content.   Assessment and  Plan:  Pregnancy: G3P2002 at [redacted]w[redacted]d  1. Supervision of high risk pregnancy, antepartum - Continue routine care - Abdomen hard in clinic, no contractions palpated - Cervix closed, contractions denied by patient, hx term SVD x 2, FFN not sent  2. Chronic hypertension affecting pregnancy - Mild elevation today - Continue Labetalol 100mg  BID, daily ASA 81mg  - CMP - Protein / creatinine ratio, urine  3. Benign gestational thrombocytopenia in third trimester [facility]) - Platelets previously 145k on 08/04/2019 - CBC  Preterm labor symptoms and general obstetric precautions including but not limited to vaginal bleeding, contractions, leaking of fluid and fetal movement were reviewed in detail with the patient. Please refer to After Visit Summary for other counseling recommendations.  Return in about 2 weeks (around 10/07/2019) for 36 week swabs.  Future Appointments  Date Time Provider Department Center  09/29/2019  9:45 AM [facility] NURSE WH-MFC MFC-US  09/29/2019  9:45 AM [facility]-MFC 11/27/2019 4 WH-MFCUS MFC-US  10/06/2019  9:30 AM [facility]-MFC NURSE WH-MFC MFC-US  10/06/2019  9:30 AM [facility]-MFC 10/08/2019 5 WH-MFCUS MFC-US  10/07/2019 10:45 AM [place], CNM [facility]    10/09/2019, CNM

## 2019-09-24 LAB — COMPREHENSIVE METABOLIC PANEL
ALT: 13 IU/L (ref 0–32)
AST: 20 IU/L (ref 0–40)
Albumin/Globulin Ratio: 1.3 (ref 1.2–2.2)
Albumin: 3.6 g/dL — ABNORMAL LOW (ref 3.9–5.0)
Alkaline Phosphatase: 95 IU/L (ref 39–117)
BUN/Creatinine Ratio: 8 — ABNORMAL LOW (ref 9–23)
BUN: 5 mg/dL — ABNORMAL LOW (ref 6–20)
Bilirubin Total: <0.2 mg/dL (ref 0.0–1.2)
CO2: 19 mmol/L — ABNORMAL LOW (ref 20–29)
Calcium: 9 mg/dL (ref 8.7–10.2)
Chloride: 104 mmol/L (ref 96–106)
Creatinine, Ser: 0.66 mg/dL (ref 0.57–1.00)
GFR calc Af Amer: 139 mL/min/{1.73_m2} (ref >59)
GFR calc non Af Amer: 121 mL/min/{1.73_m2} (ref >59)
Globulin, Total: 2.7 g/dL (ref 1.5–4.5)
Glucose: 99 mg/dL (ref 65–99)
Potassium: 3.8 mmol/L (ref 3.5–5.2)
Sodium: 137 mmol/L (ref 134–144)
Total Protein: 6.3 g/dL (ref 6.0–8.5)

## 2019-09-24 LAB — PROTEIN / CREATININE RATIO, URINE
Creatinine, Urine: 65.7 mg/dL
Protein, Ur: 7.6 mg/dL
Protein/Creat Ratio: 116 mg/g creat (ref 0–200)

## 2019-09-24 LAB — CBC
Hematocrit: 37.7 % (ref 34.0–46.6)
Hemoglobin: 12.1 g/dL (ref 11.1–15.9)
MCH: 29.7 pg (ref 26.6–33.0)
MCHC: 32.1 g/dL (ref 31.5–35.7)
MCV: 93 fL (ref 79–97)
Platelets: 141 10*3/uL — ABNORMAL LOW (ref 150–450)
RBC: 4.07 x10E6/uL (ref 3.77–5.28)
RDW: 12.7 % (ref 11.7–15.4)
WBC: 9.8 10*3/uL (ref 3.4–10.8)

## 2019-09-29 ENCOUNTER — Ambulatory Visit (HOSPITAL_COMMUNITY)
Admission: RE | Admit: 2019-09-29 | Discharge: 2019-09-29 | Disposition: A | Discharge status: Home / Self Care | Payer: 59 | Source: Ambulatory Visit | Attending: Obstetrics | Admitting: Obstetrics

## 2019-09-29 ENCOUNTER — Other Ambulatory Visit: Payer: Self-pay

## 2019-09-29 ENCOUNTER — Other Ambulatory Visit (HOSPITAL_COMMUNITY): Payer: Self-pay | Admitting: *Deleted

## 2019-09-29 ENCOUNTER — Ambulatory Visit (HOSPITAL_COMMUNITY): Payer: 59 | Admitting: *Deleted

## 2019-09-29 ENCOUNTER — Encounter (HOSPITAL_COMMUNITY): Payer: Self-pay

## 2019-09-29 DIAGNOSIS — O10913 Unspecified pre-existing hypertension complicating pregnancy, third trimester: Secondary | ICD-10-CM | POA: Insufficient documentation

## 2019-09-29 DIAGNOSIS — O10013 Pre-existing essential hypertension complicating pregnancy, third trimester: Secondary | ICD-10-CM | POA: Diagnosis not present

## 2019-09-29 DIAGNOSIS — O10919 Unspecified pre-existing hypertension complicating pregnancy, unspecified trimester: Secondary | ICD-10-CM | POA: Diagnosis present

## 2019-09-29 DIAGNOSIS — Z3A35 35 weeks gestation of pregnancy: Secondary | ICD-10-CM

## 2019-09-29 MED ORDER — CYCLOBENZAPRINE HCL 5 MG PO TABS: 5.0000 mg | ORAL_TABLET | Freq: Three times a day (TID) | ORAL | 0 refills | Status: DC | PRN

## 2019-09-29 NOTE — Telephone Encounter (Signed)
Received called from patient- she reports having severe back and leg pain since yesterday. This has cause walking to be very difficult. She use heat/ice and tyenlol l to help with her pain but this has not help.She is requesting something else to help decrease the pressure. Per Dr.Davis, can call in flexeril 5 mg # number 20. I have advised patient this medication may make her sleepy and to only use as needed. Patient voice understanding at this time.

## 2019-10-06 ENCOUNTER — Encounter (HOSPITAL_COMMUNITY): Payer: Self-pay

## 2019-10-06 ENCOUNTER — Ambulatory Visit (HOSPITAL_COMMUNITY): Payer: 59 | Admitting: *Deleted

## 2019-10-06 ENCOUNTER — Other Ambulatory Visit: Payer: Self-pay

## 2019-10-06 ENCOUNTER — Ambulatory Visit (HOSPITAL_COMMUNITY)
Admission: RE | Admit: 2019-10-06 | Discharge: 2019-10-06 | Disposition: A | Discharge status: Home / Self Care | Payer: 59 | Source: Ambulatory Visit | Attending: Obstetrics | Admitting: Obstetrics

## 2019-10-06 DIAGNOSIS — O10913 Unspecified pre-existing hypertension complicating pregnancy, third trimester: Secondary | ICD-10-CM

## 2019-10-06 DIAGNOSIS — Z3A36 36 weeks gestation of pregnancy: Secondary | ICD-10-CM

## 2019-10-06 DIAGNOSIS — O10013 Pre-existing essential hypertension complicating pregnancy, third trimester: Secondary | ICD-10-CM | POA: Diagnosis not present

## 2019-10-06 DIAGNOSIS — O10919 Unspecified pre-existing hypertension complicating pregnancy, unspecified trimester: Secondary | ICD-10-CM | POA: Diagnosis present

## 2019-10-06 DIAGNOSIS — Z362 Encounter for other antenatal screening follow-up: Secondary | ICD-10-CM | POA: Diagnosis not present

## 2019-10-07 ENCOUNTER — Ambulatory Visit (INDEPENDENT_AMBULATORY_CARE_PROVIDER_SITE_OTHER): Payer: 59 | Admitting: Advanced Practice Midwife

## 2019-10-07 VITALS — BP 134/77 | HR 89 | Wt 188.0 lb

## 2019-10-07 DIAGNOSIS — O10913 Unspecified pre-existing hypertension complicating pregnancy, third trimester: Secondary | ICD-10-CM

## 2019-10-07 DIAGNOSIS — Z113 Encounter for screening for infections with a predominantly sexual mode of transmission: Secondary | ICD-10-CM

## 2019-10-07 DIAGNOSIS — O0990 Supervision of high risk pregnancy, unspecified, unspecified trimester: Secondary | ICD-10-CM

## 2019-10-07 DIAGNOSIS — D696 Thrombocytopenia, unspecified: Secondary | ICD-10-CM

## 2019-10-07 DIAGNOSIS — O0993 Supervision of high risk pregnancy, unspecified, third trimester: Secondary | ICD-10-CM

## 2019-10-07 DIAGNOSIS — O99113 Other diseases of the blood and blood-forming organs and certain disorders involving the immune mechanism complicating pregnancy, third trimester: Secondary | ICD-10-CM

## 2019-10-07 DIAGNOSIS — Z3A36 36 weeks gestation of pregnancy: Secondary | ICD-10-CM

## 2019-10-07 DIAGNOSIS — O10919 Unspecified pre-existing hypertension complicating pregnancy, unspecified trimester: Secondary | ICD-10-CM

## 2019-10-07 NOTE — Progress Notes (Signed)
   PRENATAL VISIT NOTE  Subjective:  Cynthia Crawford is a 29 y.o. G3P2002 at [redacted]w[redacted]d being seen today for ongoing prenatal care.  She is currently monitored for the following issues for this high-risk pregnancy and has Migraine; Chronic hypertension affecting pregnancy; Benign gestational thrombocytopenia in third trimester (HCC); Intrinsic eczema; Allergic asthma; Seasonal allergic rhinitis due to pollen; History of kidney stones; Supervision of high risk pregnancy, antepartum; and Short interval between pregnancies affecting pregnancy, antepartum on their problem list.  Patient reports no complaints.  Contractions: Irregular. Vag. Bleeding: None.  Movement: Present. Denies leaking of fluid.   The following portions of the patient's history were reviewed and updated as appropriate: allergies, current medications, past family history, past medical history, past social history, past surgical history and problem list. Problem list updated.  Objective:   Vitals:   10/07/19 1059  BP: 134/77  Pulse: 89  Weight: 188 lb (85.3 kg)    Fetal Status: Fetal Heart Rate (bpm): 135 Fundal Height: 35 cm Movement: Present  Presentation: Vertex  General:  Alert, oriented and cooperative. Patient is in no acute distress.  Skin: Skin is warm and dry. No rash noted.   Cardiovascular: Normal heart rate noted  Respiratory: Normal respiratory effort, no problems with respiration noted  Abdomen: Soft, gravid, appropriate for gestational age.  Pain/Pressure: Absent     Pelvic: Cervical exam performed Dilation: 1 Effacement (%): Thick Station: -3  Extremities: Normal range of motion.  Edema: Moderate pitting, indentation subsides rapidly  Mental Status: Normal mood and affect. Normal behavior. Normal judgment and thought content.   Assessment and Plan:  Pregnancy: G3P2002 at [redacted]w[redacted]d  1. Supervision of high risk pregnancy, antepartum - Continue routine care - Culture, beta strep (group b only) -  GC/Chlamydia probe amp ([place])not at [facility]  2. Chronic hypertension affecting pregnancy - Stable on Labetalol 100 mg BID - Compliant with bASA 81 mg as previously advised  3. Benign gestational thrombocytopenia in third trimester [facility]) - Platelets 145 11/17, 141 01/06  Preterm labor symptoms and general obstetric precautions including but not limited to vaginal bleeding, contractions, leaking of fluid and fetal movement were reviewed in detail with the patient. Please refer to After Visit Summary for other counseling recommendations.  Return in about 1 week (around 10/14/2019).  Future Appointments  Date Time Provider Department Center  10/13/2019  2:15 PM [facility] NURSE [facility] MFC-US  10/13/2019  2:15 PM WH-MFC [place] 4 WH-MFCUS MFC-US  10/14/2019  9:45 AM Federico Flake, MD [facility]  10/20/2019  8:45 AM [facility]-M[facility] NURSE WH-MFC MFC-US  10/20/2019  8:45 AM WH-MFC [place] 2 WH-MFCUS MFC-US    Calvert Cantor, CNM

## 2019-10-07 NOTE — Patient Instructions (Signed)

## 2019-10-08 LAB — GC/CHLAMYDIA PROBE AMP (~~LOC~~) NOT AT ARMC
Chlamydia: NEGATIVE
Comment: NEGATIVE
Comment: NORMAL
Neisseria Gonorrhea: NEGATIVE

## 2019-10-11 LAB — CULTURE, BETA STREP (GROUP B ONLY): Strep Gp B Culture: NEGATIVE

## 2019-10-13 ENCOUNTER — Encounter (HOSPITAL_COMMUNITY): Payer: Self-pay | Admitting: *Deleted

## 2019-10-13 ENCOUNTER — Ambulatory Visit (HOSPITAL_COMMUNITY)
Admission: RE | Admit: 2019-10-13 | Discharge: 2019-10-13 | Disposition: A | Discharge status: Home / Self Care | Payer: 59 | Source: Ambulatory Visit | Attending: Obstetrics and Gynecology | Admitting: Obstetrics and Gynecology

## 2019-10-13 ENCOUNTER — Other Ambulatory Visit: Payer: Self-pay

## 2019-10-13 ENCOUNTER — Ambulatory Visit (HOSPITAL_COMMUNITY): Payer: 59 | Admitting: *Deleted

## 2019-10-13 DIAGNOSIS — O10013 Pre-existing essential hypertension complicating pregnancy, third trimester: Secondary | ICD-10-CM | POA: Diagnosis not present

## 2019-10-13 DIAGNOSIS — O10919 Unspecified pre-existing hypertension complicating pregnancy, unspecified trimester: Secondary | ICD-10-CM | POA: Insufficient documentation

## 2019-10-13 DIAGNOSIS — O10913 Unspecified pre-existing hypertension complicating pregnancy, third trimester: Secondary | ICD-10-CM | POA: Insufficient documentation

## 2019-10-13 DIAGNOSIS — Z3A37 37 weeks gestation of pregnancy: Secondary | ICD-10-CM

## 2019-10-14 ENCOUNTER — Ambulatory Visit (INDEPENDENT_AMBULATORY_CARE_PROVIDER_SITE_OTHER): Payer: 59 | Admitting: Family Medicine

## 2019-10-14 VITALS — BP 131/79 | HR 74 | Wt 188.8 lb

## 2019-10-14 DIAGNOSIS — O09899 Supervision of other high risk pregnancies, unspecified trimester: Secondary | ICD-10-CM

## 2019-10-14 DIAGNOSIS — O10913 Unspecified pre-existing hypertension complicating pregnancy, third trimester: Secondary | ICD-10-CM

## 2019-10-14 DIAGNOSIS — D696 Thrombocytopenia, unspecified: Secondary | ICD-10-CM

## 2019-10-14 DIAGNOSIS — O0993 Supervision of high risk pregnancy, unspecified, third trimester: Secondary | ICD-10-CM

## 2019-10-14 DIAGNOSIS — O10919 Unspecified pre-existing hypertension complicating pregnancy, unspecified trimester: Secondary | ICD-10-CM

## 2019-10-14 DIAGNOSIS — O0990 Supervision of high risk pregnancy, unspecified, unspecified trimester: Secondary | ICD-10-CM

## 2019-10-14 DIAGNOSIS — Z3A37 37 weeks gestation of pregnancy: Secondary | ICD-10-CM

## 2019-10-14 DIAGNOSIS — O99113 Other diseases of the blood and blood-forming organs and certain disorders involving the immune mechanism complicating pregnancy, third trimester: Secondary | ICD-10-CM

## 2019-10-14 NOTE — Progress Notes (Signed)
   PRENATAL VISIT NOTE  Subjective:  Cynthia Crawford is a 29 y.o. G3P2002 at [redacted]w[redacted]d being seen today for ongoing prenatal care.  She is currently monitored for the following issues for this high-risk pregnancy and has Migraine; Chronic hypertension affecting pregnancy; Benign gestational thrombocytopenia in third trimester (HCC); Intrinsic eczema; Allergic asthma; Seasonal allergic rhinitis due to pollen; History of kidney stones; Supervision of high risk pregnancy, antepartum; and Short interval between pregnancies affecting pregnancy, antepartum on their problem list.  Patient reports no complaints.  Contractions: Irritability. Vag. Bleeding: None.  Movement: Present. Denies leaking of fluid.   The following portions of the patient's history were reviewed and updated as appropriate: allergies, current medications, past family history, past medical history, past social history, past surgical history and problem list.   Objective:   Vitals:   10/14/19 0947  BP: 131/79  Pulse: 74  Weight: 188 lb 12.8 oz (85.6 kg)    Fetal Status: Fetal Heart Rate (bpm): 135   Movement: Present     General:  Alert, oriented and cooperative. Patient is in no acute distress.  Skin: Skin is warm and dry. No rash noted.   Cardiovascular: Normal heart rate noted  Respiratory: Normal respiratory effort, no problems with respiration noted  Abdomen: Soft, gravid, appropriate for gestational age.  Pain/Pressure: Present     Pelvic: Cervical exam performed Dilation: 1.5 Effacement (%): Thick Station: Ballotable  Extremities: Normal range of motion.     Mental Status: Normal mood and affect. Normal behavior. Normal judgment and thought content.   Assessment and Plan:  Pregnancy: G3P2002 at [redacted]w[redacted]d  1. Supervision of high risk pregnancy, antepartum Up to date Reviewed plan for IOL at 39 wk due to cHTN  2. Short interval between pregnancies affecting pregnancy, antepartum  3. Benign gestational  thrombocytopenia in third trimester (HCC) Stable  4. Chronic hypertension affecting pregnancy BP stable today, continue labetalol  Reviewed IOL at 39 weeks.   Preterm labor symptoms and general obstetric precautions including but not limited to vaginal bleeding, contractions, leaking of fluid and fetal movement were reviewed in detail with the patient. Please refer to After Visit Summary for other counseling recommendations.   Return in about 1 week (around 10/21/2019) for Routine prenatal care, Telehealth/Virtual health OB Visit.  Future Appointments  Date Time Provider Department Center  10/20/2019  8:45 AM [facility] NURSE WH-MFC MFC-US  10/20/2019  8:45 AM [facility] [place] 2 WH-MFCUS MFC-US  10/21/2019  9:45 AM Calvert Cantor, CNM [facility]  10/24/2019 12:00 AM MC-LD SCHED ROOM [facility]-IN[facility] None    Federico Flake, MD

## 2019-10-18 ENCOUNTER — Other Ambulatory Visit: Payer: Self-pay | Admitting: Family Medicine

## 2019-10-19 ENCOUNTER — Encounter (HOSPITAL_COMMUNITY): Payer: Self-pay | Admitting: *Deleted

## 2019-10-19 ENCOUNTER — Telehealth (HOSPITAL_COMMUNITY): Payer: Self-pay | Admitting: *Deleted

## 2019-10-19 NOTE — Telephone Encounter (Signed)
Preadmission screen  

## 2019-10-20 ENCOUNTER — Ambulatory Visit (HOSPITAL_COMMUNITY)
Admission: RE | Admit: 2019-10-20 | Discharge: 2019-10-20 | Disposition: A | Discharge status: Home / Self Care | Payer: 59 | Source: Ambulatory Visit | Attending: Obstetrics and Gynecology | Admitting: Obstetrics and Gynecology

## 2019-10-20 ENCOUNTER — Ambulatory Visit (HOSPITAL_COMMUNITY): Payer: 59 | Admitting: *Deleted

## 2019-10-20 ENCOUNTER — Encounter (HOSPITAL_COMMUNITY): Payer: Self-pay

## 2019-10-20 ENCOUNTER — Other Ambulatory Visit: Payer: Self-pay

## 2019-10-20 DIAGNOSIS — O10919 Unspecified pre-existing hypertension complicating pregnancy, unspecified trimester: Secondary | ICD-10-CM | POA: Diagnosis present

## 2019-10-20 DIAGNOSIS — O10013 Pre-existing essential hypertension complicating pregnancy, third trimester: Secondary | ICD-10-CM | POA: Diagnosis not present

## 2019-10-20 DIAGNOSIS — Z3A38 38 weeks gestation of pregnancy: Secondary | ICD-10-CM | POA: Diagnosis not present

## 2019-10-20 DIAGNOSIS — O10913 Unspecified pre-existing hypertension complicating pregnancy, third trimester: Secondary | ICD-10-CM

## 2019-10-21 ENCOUNTER — Telehealth (INDEPENDENT_AMBULATORY_CARE_PROVIDER_SITE_OTHER): Payer: 59 | Admitting: Advanced Practice Midwife

## 2019-10-21 ENCOUNTER — Other Ambulatory Visit: Payer: Self-pay | Admitting: Advanced Practice Midwife

## 2019-10-21 DIAGNOSIS — Z3A38 38 weeks gestation of pregnancy: Secondary | ICD-10-CM

## 2019-10-21 DIAGNOSIS — O10913 Unspecified pre-existing hypertension complicating pregnancy, third trimester: Secondary | ICD-10-CM

## 2019-10-21 DIAGNOSIS — O99113 Other diseases of the blood and blood-forming organs and certain disorders involving the immune mechanism complicating pregnancy, third trimester: Secondary | ICD-10-CM

## 2019-10-21 DIAGNOSIS — D696 Thrombocytopenia, unspecified: Secondary | ICD-10-CM

## 2019-10-21 DIAGNOSIS — O0990 Supervision of high risk pregnancy, unspecified, unspecified trimester: Secondary | ICD-10-CM

## 2019-10-21 DIAGNOSIS — O0993 Supervision of high risk pregnancy, unspecified, third trimester: Secondary | ICD-10-CM

## 2019-10-21 DIAGNOSIS — O10919 Unspecified pre-existing hypertension complicating pregnancy, unspecified trimester: Secondary | ICD-10-CM

## 2019-10-21 NOTE — Progress Notes (Signed)
TELEHEALTH OBSTETRICS PRENATAL VIRTUAL VIDEO VISIT ENCOUNTER NOTE  Provider location: Center for [facility]   I connected with Cynthia Crawford on 10/21/19 at  9:45 AM EST by MyChart Video Encounter at home and verified that I am speaking with the correct person using two identifiers.   I discussed the limitations, risks, security and privacy concerns of performing an evaluation and management service virtually and the availability of in person appointments. I also discussed with the patient that there may be a patient responsible charge related to this service. The patient expressed understanding and agreed to proceed. Subjective:  Cynthia Crawford is a 29 y.o. G3P2002 at [redacted]w[redacted]d being seen today for ongoing prenatal care.  She is currently monitored for the following issues for this high-risk pregnancy and has Migraine; Chronic hypertension affecting pregnancy; Benign gestational thrombocytopenia in third trimester (HCC); Intrinsic eczema; Allergic asthma; Seasonal allergic rhinitis due to pollen; History of kidney stones; Supervision of high risk pregnancy, antepartum; and Short interval between pregnancies affecting pregnancy, antepartum on their problem list.  Patient reports no complaints. Denies headache, visual disturbances, RUQ/epigastric pain, new onset swelling or weight gain.  Contractions: Irregular. Vag. Bleeding: None.  Movement: Present. Denies any leaking of fluid.   The following portions of the patient's history were reviewed and updated as appropriate: allergies, current medications, past family history, past medical history, past social history, past surgical history and problem list.   Objective:  There were no vitals filed for this visit.  Fetal Status:     Movement: Present     General:  Alert, oriented and cooperative. Patient is in no acute distress.  Respiratory: Normal respiratory effort, no problems with respiration noted    Mental Status: Normal mood and affect. Normal behavior. Normal judgment and thought content.  Rest of physical exam deferred due to type of encounter  Imaging: [place] MFM FETAL BPP WO NON STRESS  Result Date: 10/20/2019 ----------------------------------------------------------------------  OBSTETRICS REPORT                       (Signed Final 10/20/2019 04:08 pm) ---------------------------------------------------------------------- Patient Info  ID #:       [id number]                          D.O.B.:  Apr 01, 1991 (28 yrs)  Name:       Cynthia Crawford                   Visit Date: 10/20/2019 08:58 am              [place] ---------------------------------------------------------------------- Performed By  Performed By:     Percell Boston          Referred By:      Barbie Haggis  Attending:        Lin Landsman      [facility]                    MD  Fetal [facility] ---------------------------------------------------------------------- Orders   #  Description                          Code         Ordered By   1  [place] MFM FETAL BPP WO NON              44818.56     RAVI [facility]      STRESS  ----------------------------------------------------------------------   #  Order #                    Accession #                 Episode #   1  [id number]                  [id number]  ---------------------------------------------------------------------- Indications   [redacted] weeks gestation of pregnancy                Z3A.38   Hypertension - Chronic/Pre-existing (          O10.019   Labetalol & ASA)   Umbilical vein abnormality complicating        H20.9XX0   pregnancy (PRUV)( Negative QUAD)  ---------------------------------------------------------------------- Vital Signs                                                 Height:        5'4"  ---------------------------------------------------------------------- Fetal Evaluation  Num Of Fetuses:         1  Fetal Heart Rate(bpm):  158  Cardiac Activity:       Observed  Presentation:           Cephalic  Amniotic Fluid  AFI FV:      Within normal limits  AFI Sum(cm)     %Tile       Largest Pocket(cm)  11.98           41          3.21  RUQ(cm)       RLQ(cm)       LUQ(cm)        LLQ(cm)  3.04          3.21          2.52           3.21 ---------------------------------------------------------------------- Biophysical Evaluation  Amniotic F.V:   Within normal limits       F. Tone:        Observed  F. Movement:    Observed                   Score:          8/8  F. Breathing:   Observed ---------------------------------------------------------------------- OB History  Gravidity:    3         Term:   2  Living:       2 ---------------------------------------------------------------------- Gestational Age  LMP:           38w 3d        Date:  01/24/19                 EDD:   10/31/19  Best:          38w 1d     Det. By:  Previous Ultrasound      EDD:   11/02/19                                      (03/19/19) ---------------------------------------------------------------------- Anatomy  Thoracic:              Appears normal         Bladder:                Appears normal  Stomach:               Appears normal, left                         sided ---------------------------------------------------------------------- Impression  Biophysical profile 8/8 performed given chronic hypertension  on labetalol  Norma BP today  Known PRUV ---------------------------------------------------------------------- Recommendations  Scheduled delivery on 2/6 ----------------------------------------------------------------------               Lin Landsman, MD Electronically Signed Final Report   10/20/2019 04:08 pm ----------------------------------------------------------------------  Assessment and Plan:  Pregnancy: [id number] at  [redacted]w[redacted]d 1. Supervision of high risk pregnancy, antepartum - GBS Neg  2. Chronic hypertension affecting pregnancy -Stable on Labetalol - IOL previously scheduled, orders placed today. Plan for foley + Cytotec  3. Benign gestational thrombocytopenia in third trimester (HCC) - Stable  Term labor symptoms and general obstetric precautions including but not limited to vaginal bleeding, contractions, leaking of fluid and fetal movement were reviewed in detail with the patient. I discussed the assessment and treatment plan with the patient. The patient was provided an opportunity to ask questions and all were answered. The patient agreed with the plan and demonstrated an understanding of the instructions. The patient was advised to call back or seek an in-person office evaluation/go to [facility] at [facility] for any urgent or concerning symptoms. Please refer to After Visit Summary for other counseling recommendations.   I provided 10 minutes of face-to-face time during this encounter.  No follow-ups on file.  Future Appointments  Date Time Provider Department Center  10/22/2019  9:10 AM [facility]-SCREENING [facility] None  10/24/2019 12:00 AM [facility]-LD SCHED ROOM [facility] None  11/24/2019 10:00 AM Reva Bores, MD [facility]    Calvert Cantor, CNM [facility] [employer]

## 2019-10-21 NOTE — Progress Notes (Signed)
I connected with  Cynthia Crawford on 10/21/19 at  9:45 AM EST by telephone and verified that I am speaking with the correct person using two identifiers.   I discussed the limitations, risks, security and privacy concerns of performing an evaluation and management service by telephone and the availability of in person appointments. I also discussed with the patient that there may be a patient responsible charge related to this service. The patient expressed understanding and agreed to proceed.  Scheryl Marten, RN 10/21/2019  9:44 AM

## 2019-10-22 ENCOUNTER — Other Ambulatory Visit (HOSPITAL_COMMUNITY): Payer: Self-pay | Admitting: Advanced Practice Midwife

## 2019-10-22 ENCOUNTER — Other Ambulatory Visit (HOSPITAL_COMMUNITY)
Admission: RE | Admit: 2019-10-22 | Discharge: 2019-10-22 | Disposition: A | Discharge status: Home / Self Care | Payer: 59 | Source: Ambulatory Visit | Attending: Obstetrics and Gynecology | Admitting: Obstetrics and Gynecology

## 2019-10-22 LAB — SARS CORONAVIRUS 2 (TAT 6-24 HRS): SARS Coronavirus 2: NEGATIVE

## 2019-10-24 ENCOUNTER — Inpatient Hospital Stay (HOSPITAL_COMMUNITY)
Admission: RE | Admit: 2019-10-24 | Discharge: 2019-10-25 | DRG: 806 | Disposition: A | Discharge status: Home / Self Care | Payer: 59 | Attending: Obstetrics and Gynecology | Admitting: Obstetrics and Gynecology

## 2019-10-24 ENCOUNTER — Encounter (HOSPITAL_COMMUNITY): Payer: Self-pay | Admitting: Obstetrics & Gynecology

## 2019-10-24 ENCOUNTER — Inpatient Hospital Stay (HOSPITAL_COMMUNITY): Payer: 59 | Admitting: Anesthesiology

## 2019-10-24 ENCOUNTER — Inpatient Hospital Stay (HOSPITAL_COMMUNITY): Payer: 59

## 2019-10-24 ENCOUNTER — Other Ambulatory Visit: Payer: Self-pay

## 2019-10-24 DIAGNOSIS — O9912 Other diseases of the blood and blood-forming organs and certain disorders involving the immune mechanism complicating childbirth: Secondary | ICD-10-CM | POA: Diagnosis present

## 2019-10-24 DIAGNOSIS — Z20822 Contact with and (suspected) exposure to covid-19: Secondary | ICD-10-CM | POA: Diagnosis present

## 2019-10-24 DIAGNOSIS — O10919 Unspecified pre-existing hypertension complicating pregnancy, unspecified trimester: Secondary | ICD-10-CM

## 2019-10-24 DIAGNOSIS — D6959 Other secondary thrombocytopenia: Secondary | ICD-10-CM | POA: Diagnosis present

## 2019-10-24 DIAGNOSIS — O09899 Supervision of other high risk pregnancies, unspecified trimester: Secondary | ICD-10-CM

## 2019-10-24 DIAGNOSIS — Z880 Allergy status to penicillin: Secondary | ICD-10-CM | POA: Diagnosis not present

## 2019-10-24 DIAGNOSIS — I10 Essential (primary) hypertension: Secondary | ICD-10-CM | POA: Diagnosis present

## 2019-10-24 DIAGNOSIS — E669 Obesity, unspecified: Secondary | ICD-10-CM | POA: Diagnosis present

## 2019-10-24 DIAGNOSIS — Z3A39 39 weeks gestation of pregnancy: Secondary | ICD-10-CM

## 2019-10-24 DIAGNOSIS — O99214 Obesity complicating childbirth: Secondary | ICD-10-CM | POA: Diagnosis present

## 2019-10-24 DIAGNOSIS — J45909 Unspecified asthma, uncomplicated: Secondary | ICD-10-CM | POA: Diagnosis present

## 2019-10-24 DIAGNOSIS — O1002 Pre-existing essential hypertension complicating childbirth: Principal | ICD-10-CM | POA: Diagnosis present

## 2019-10-24 DIAGNOSIS — O9952 Diseases of the respiratory system complicating childbirth: Secondary | ICD-10-CM | POA: Diagnosis present

## 2019-10-24 LAB — COMPREHENSIVE METABOLIC PANEL
ALT: 20 U/L (ref 0–44)
AST: 24 U/L (ref 15–41)
Albumin: 2.9 g/dL — ABNORMAL LOW (ref 3.5–5.0)
Alkaline Phosphatase: 90 U/L (ref 38–126)
Anion gap: 11 (ref 5–15)
BUN: 9 mg/dL (ref 6–20)
CO2: 18 mmol/L — ABNORMAL LOW (ref 22–32)
Calcium: 9.2 mg/dL (ref 8.9–10.3)
Chloride: 108 mmol/L (ref 98–111)
Creatinine, Ser: 0.63 mg/dL (ref 0.44–1.00)
GFR calc Af Amer: >60 mL/min (ref >60)
GFR calc non Af Amer: >60 mL/min (ref >60)
Glucose, Bld: 96 mg/dL (ref 70–99)
Potassium: 3.9 mmol/L (ref 3.5–5.1)
Sodium: 137 mmol/L (ref 135–145)
Total Bilirubin: 0.4 mg/dL (ref 0.3–1.2)
Total Protein: 6.2 g/dL — ABNORMAL LOW (ref 6.5–8.1)

## 2019-10-24 LAB — CBC
HCT: 36.8 % (ref 36.0–46.0)
HCT: 38.5 % (ref 36.0–46.0)
Hemoglobin: 12.4 g/dL (ref 12.0–15.0)
Hemoglobin: 12.7 g/dL (ref 12.0–15.0)
MCH: 30.8 pg (ref 26.0–34.0)
MCH: 30.8 pg (ref 26.0–34.0)
MCHC: 33.7 g/dL (ref 30.0–36.0)
MCHC: 33 g/dL (ref 30.0–36.0)
MCV: 91.5 fL (ref 80.0–100.0)
MCV: 93.4 fL (ref 80.0–100.0)
Platelets: 126 10*3/uL — ABNORMAL LOW (ref 150–400)
Platelets: 125 10*3/uL — ABNORMAL LOW (ref 150–400)
RBC: 4.02 MIL/uL (ref 3.87–5.11)
RBC: 4.12 MIL/uL (ref 3.87–5.11)
RDW: 13.1 % (ref 11.5–15.5)
RDW: 13.2 % (ref 11.5–15.5)
WBC: 10.6 10*3/uL — ABNORMAL HIGH (ref 4.0–10.5)
WBC: 14.9 10*3/uL — ABNORMAL HIGH (ref 4.0–10.5)
nRBC: 0 % (ref 0.0–0.2)
nRBC: 0 % (ref 0.0–0.2)

## 2019-10-24 LAB — TYPE AND SCREEN
ABO/RH(D): O POS
Antibody Screen: NEGATIVE

## 2019-10-24 LAB — PROTEIN / CREATININE RATIO, URINE
Creatinine, Urine: 31.2 mg/dL
Total Protein, Urine: <6 mg/dL

## 2019-10-24 LAB — RPR: RPR Ser Ql: NONREACTIVE

## 2019-10-24 LAB — ABO/RH: ABO/RH(D): O POS

## 2019-10-24 MED ORDER — DIPHENHYDRAMINE HCL 25 MG PO CAPS: 25.0000 mg | ORAL_CAPSULE | Freq: Four times a day (QID) | ORAL | Status: DC | PRN

## 2019-10-24 MED ORDER — SODIUM CHLORIDE (PF) 0.9 % IJ SOLN
INTRAMUSCULAR | Status: DC | PRN
  Administered 2019-10-24: 12 mL/h via EPIDURAL

## 2019-10-24 MED ORDER — LACTATED RINGERS IV SOLN: INTRAVENOUS | Status: DC

## 2019-10-24 MED ORDER — COCONUT OIL OIL: 1.0000 "application " | TOPICAL_OIL | Status: DC | PRN

## 2019-10-24 MED ORDER — SIMETHICONE 80 MG PO CHEW: 80.0000 mg | CHEWABLE_TABLET | ORAL | Status: DC | PRN

## 2019-10-24 MED ORDER — BENZOCAINE-MENTHOL 20-0.5 % EX AERO: 1.0000 "application " | INHALATION_SPRAY | CUTANEOUS | Status: DC | PRN

## 2019-10-24 MED ORDER — PHENYLEPHRINE 40 MCG/ML (10ML) SYRINGE FOR IV PUSH (FOR BLOOD PRESSURE SUPPORT): 80.0000 ug | PREFILLED_SYRINGE | INTRAVENOUS | Status: DC | PRN

## 2019-10-24 MED ORDER — MISOPROSTOL 50MCG HALF TABLET
ORAL_TABLET | ORAL | Status: AC
  Administered 2019-10-24: 01:00:00 50 ug via ORAL
  Filled 2019-10-24: qty 1

## 2019-10-24 MED ORDER — TETANUS-DIPHTH-ACELL PERTUSSIS 5-2.5-18.5 LF-MCG/0.5 IM SUSP: 0.5000 mL | Freq: Once | INTRAMUSCULAR | Status: DC

## 2019-10-24 MED ORDER — DIBUCAINE (PERIANAL) 1 % EX OINT: 1.0000 "application " | TOPICAL_OINTMENT | CUTANEOUS | Status: DC | PRN

## 2019-10-24 MED ORDER — MISOPROSTOL 25 MCG QUARTER TABLET: 25.0000 ug | ORAL_TABLET | ORAL | Status: DC | PRN

## 2019-10-24 MED ORDER — FENTANYL-BUPIVACAINE-NACL 0.5-0.125-0.9 MG/250ML-% EP SOLN: 12.0000 mL/h | EPIDURAL | Status: DC | PRN

## 2019-10-24 MED ORDER — SOD CITRATE-CITRIC ACID 500-334 MG/5ML PO SOLN: 30.0000 mL | ORAL | Status: DC | PRN

## 2019-10-24 MED ORDER — OXYTOCIN BOLUS FROM INFUSION
500.0000 mL | Freq: Once | INTRAVENOUS | Status: AC
  Administered 2019-10-24: 500 mL via INTRAVENOUS

## 2019-10-24 MED ORDER — LACTATED RINGERS IV SOLN: 500.0000 mL | INTRAVENOUS | Status: DC | PRN

## 2019-10-24 MED ORDER — TERBUTALINE SULFATE 1 MG/ML IJ SOLN: 0.2500 mg | Freq: Once | INTRAMUSCULAR | Status: DC | PRN

## 2019-10-24 MED ORDER — ACETAMINOPHEN 325 MG PO TABS
650.0000 mg | ORAL_TABLET | ORAL | Status: DC | PRN
  Administered 2019-10-24: 18:00:00 650 mg via ORAL
  Filled 2019-10-24: qty 2

## 2019-10-24 MED ORDER — SENNOSIDES-DOCUSATE SODIUM 8.6-50 MG PO TABS
2.0000 | ORAL_TABLET | ORAL | Status: DC
  Administered 2019-10-25: 01:00:00 2 via ORAL
  Filled 2019-10-24: qty 2

## 2019-10-24 MED ORDER — LIDOCAINE HCL (PF) 1 % IJ SOLN: 30.0000 mL | INTRAMUSCULAR | Status: DC | PRN

## 2019-10-24 MED ORDER — WITCH HAZEL-GLYCERIN EX PADS: 1.0000 "application " | MEDICATED_PAD | CUTANEOUS | Status: DC | PRN

## 2019-10-24 MED ORDER — FENTANYL-BUPIVACAINE-NACL 0.5-0.125-0.9 MG/250ML-% EP SOLN
EPIDURAL | Status: AC
  Filled 2019-10-24: qty 250

## 2019-10-24 MED ORDER — LIDOCAINE HCL (PF) 1 % IJ SOLN
INTRAMUSCULAR | Status: DC | PRN
  Administered 2019-10-24 (×2): 4 mL via EPIDURAL

## 2019-10-24 MED ORDER — ONDANSETRON HCL 4 MG/2ML IJ SOLN: 4.0000 mg | INTRAMUSCULAR | Status: DC | PRN

## 2019-10-24 MED ORDER — ONDANSETRON HCL 4 MG PO TABS: 4.0000 mg | ORAL_TABLET | ORAL | Status: DC | PRN

## 2019-10-24 MED ORDER — MISOPROSTOL 50MCG HALF TABLET: 50.0000 ug | ORAL_TABLET | ORAL | Status: DC

## 2019-10-24 MED ORDER — OXYTOCIN 40 UNITS IN NORMAL SALINE INFUSION - SIMPLE MED
1.0000 m[IU]/min | INTRAVENOUS | Status: DC
  Administered 2019-10-24: 05:00:00 2 m[IU]/min via INTRAVENOUS
  Filled 2019-10-24: qty 1000

## 2019-10-24 MED ORDER — OXYTOCIN 40 UNITS IN NORMAL SALINE INFUSION - SIMPLE MED: 2.5000 [IU]/h | INTRAVENOUS | Status: DC

## 2019-10-24 MED ORDER — PRENATAL MULTIVITAMIN CH
1.0000 | ORAL_TABLET | Freq: Every day | ORAL | Status: DC
  Administered 2019-10-24 – 2019-10-25 (×2): 1 via ORAL
  Filled 2019-10-24 (×2): qty 1

## 2019-10-24 MED ORDER — ONDANSETRON HCL 4 MG/2ML IJ SOLN: 4.0000 mg | Freq: Four times a day (QID) | INTRAMUSCULAR | Status: DC | PRN

## 2019-10-24 MED ORDER — ZOLPIDEM TARTRATE 5 MG PO TABS: 5.0000 mg | ORAL_TABLET | Freq: Every evening | ORAL | Status: DC | PRN

## 2019-10-24 MED ORDER — IBUPROFEN 600 MG PO TABS
600.0000 mg | ORAL_TABLET | Freq: Four times a day (QID) | ORAL | Status: DC
  Administered 2019-10-24 – 2019-10-25 (×5): 600 mg via ORAL
  Filled 2019-10-24 (×5): qty 1

## 2019-10-24 MED ORDER — LACTATED RINGERS IV SOLN: 500.0000 mL | Freq: Once | INTRAVENOUS | Status: DC

## 2019-10-24 MED ORDER — ACETAMINOPHEN 325 MG PO TABS: 650.0000 mg | ORAL_TABLET | ORAL | Status: DC | PRN

## 2019-10-24 MED ORDER — LABETALOL HCL 100 MG PO TABS
100.0000 mg | ORAL_TABLET | Freq: Two times a day (BID) | ORAL | Status: DC
  Administered 2019-10-24: 10:00:00 100 mg via ORAL
  Filled 2019-10-24: qty 1

## 2019-10-24 MED ORDER — DIPHENHYDRAMINE HCL 50 MG/ML IJ SOLN: 12.5000 mg | INTRAMUSCULAR | Status: DC | PRN

## 2019-10-24 NOTE — Anesthesia Procedure Notes (Signed)

## 2019-10-24 NOTE — H&P (Addendum)
OBSTETRIC ADMISSION HISTORY AND PHYSICAL  Cynthia Crawford is a 29 y.o. female G3P2002 with IUP at [redacted]w[redacted]d by LMP c/w 7wk [place] presenting for IOL d/t cHTN. She reports +FMs, No LOF, no VB, no blurry vision, headaches, and RUQ pain.  She does have peripheral edema in her hands and feet. She plans on breast feeding. She requests POPs for birth control. She received her prenatal care at [facility]  Dating: By LMP c/w 7wk [place] --->  Estimated Date of Delivery: 10/31/19  Sono:    @[redacted]w[redacted]d , CWD, normal anatomy, variable presentation, posterior lie, 288g, 58% EFW @[redacted]w[redacted]d , CWD, PRUV, cephalic presentation, posterior placental, 1144g, 33%EFW  Prenatal History/Complications: CHTN, on ASA and labetalol 100mg  BID Gestational thrombocytopenia Short interval pregnancy Asthma PRUV with negative quad screen  Past Medical History: Past Medical History:  Diagnosis Date   Asthma    Chronic hypertension during pregnancy, antepartum 08/29/2017   [x]  Aspirin 81 mg daily after 12 weeks; discontinue after 36 weeks Current antihypertensives:  None  Was on Enalapril prior to pregnancy  Baseline and surveillance labs (pulled in from [facility], refresh links as needed)  Lab Results Component Value Date  PLT 213 10/16/2017  CREATININE 0.61 10/16/2017  AST 17 10/16/2017  ALT 13 10/16/2017  PROTCRRATIO 0.21 (H) 01/17/2016  PROTEIN24HR 242 (H) 01/16/2016   Contact dermatitis due to soap 11/20/2017   History of gestational hypertension    Kidney stones     Past Surgical History: Past Surgical History:  Procedure Laterality Date   TONSILLECTOMY      Obstetrical History: OB History     Gravida  3   Para  2   Term  2   Preterm  0   AB  0   Living  2      SAB  0   TAB  0   Ectopic  0   Multiple  0   Live Births  2           Social History: Social History   Socioeconomic History   Marital status: Married    Spouse name: Not on file   Number of children: 1   Years of education: Not on file    Highest education level: Not on file  Occupational History   Not on file  Tobacco Use   Smoking status: Never Smoker   Smokeless tobacco: Never Used  Substance and Sexual Activity   Alcohol use: No   Drug use: No   Sexual activity: Yes    Partners: Male    Birth control/protection: None    Comment: one month ago  Other Topics Concern   Not on file  Social History Narrative   Not on file   Social Determinants of Health   Financial Resource Strain:    Difficulty of Paying Living Expenses: Not on file  Food Insecurity:    Worried About 10/18/2017 in the Last Year: Not on file   03/18/2016 of Food in the Last Year: Not on file  Transportation Needs:    Lack of Transportation (Medical): Not on file   Lack of Transportation (Non-Medical): Not on file  Physical Activity:    Days of Exercise per Week: Not on file   Minutes of Exercise per Session: Not on file  Stress:    Feeling of Stress : Not on file  Social Connections:    Frequency of Communication with Friends and Family: Not on file   Frequency of Social Gatherings with  Friends and Family: Not on file   Attends Religious Services: Not on file   Active Member of Clubs or Organizations: Not on file   Attends Archivist Meetings: Not on file   Marital Status: Not on file    Family History: Family History  Problem Relation Age of Onset   Diabetes Father    Asthma Mother     Allergies: Allergies  Allergen Reactions   Penicillins Hives    Has patient had a PCN reaction causing immediate rash, facial/tongue/throat swelling, SOB or lightheadedness with hypotension: Yes Has patient had a PCN reaction causing severe rash involving mucus membranes or skin necrosis: No Has patient had a PCN reaction that required hospitalization No Has patient had a PCN reaction occurring within the last 10 years: No If all of the above answers are "NO", then may proceed with Cephalosporin use.   Sulfa Antibiotics Hives     Medications Prior to Admission  Medication Sig Dispense Refill Last Dose   aspirin EC 81 MG tablet Take 1 tablet (81 mg total) by mouth daily. Take after 12 weeks for prevention of preeclampsia later in pregnancy (about April 18, 2019) 300 tablet 2    cetirizine (ZYRTEC) 10 MG tablet Take 1 tablet (10 mg total) by mouth daily. 30 tablet 11    cyclobenzaprine (FLEXERIL) 5 MG tablet Take 1 tablet (5 mg total) by mouth 3 (three) times daily as needed for muscle spasms. (Patient not taking: Reported on 10/14/2019) 20 tablet 0    labetalol (NORMODYNE) 100 MG tablet TAKE 1 TABLET BY MOUTH TWICE A DAY 180 tablet 1    Prenatal Vit-Fe Fumarate-FA (PRENATAL MULTIVITAMIN) TABS tablet Take 1 tablet by mouth at bedtime.        Review of Systems   All systems reviewed and negative except as stated in HPI  Last menstrual period 01/24/2019, currently breastfeeding. General appearance: alert, cooperative, appears stated age and no distress Lungs: normal effort Heart: regular rate  Abdomen: soft, non-tender; bowel sounds normal Pelvic: gravid uterus GU: No vaginal lesions  Extremities: Homans sign is negative, no sign of DVT DTR's intact Presentation: cephalic Fetal monitoringBaseline: 135 bpm, Variability: Good {> 6 bpm), Accelerations: Reactive and Decelerations: Absent Uterine activity: None     Prenatal labs: ABO, Rh: O/Positive/-- (07/29 0953) Antibody: Negative (07/29 0953) Rubella: 3.91 (07/29 0953) RPR: Non Reactive (11/17 0850)  HBsAg: Negative (07/29 0953)  HIV: Non Reactive (11/17 0850)  GBS: Negative/-- (01/20 1105)  2 hr Glucola 78 Genetic screening  Quad negative Anatomy US PRUV, otherwise normal  Prenatal Transfer Tool  Maternal Diabetes: No Genetic Screening: Normal Maternal Ultrasounds/Referrals: Other:PRUV Fetal Ultrasounds or other Referrals:  Referred to Materal Fetal Medicine  Maternal Substance Abuse:  No Significant Maternal Medications:  Meds include: Other:  ASA, labetalol Significant Maternal Lab Results: Group B Strep negative  No results found for this or any previous visit (from the past 24 hour(s)).  Patient Active Problem List   Diagnosis Date Noted   Supervision of high risk pregnancy, antepartum 04/15/2019   Short interval between pregnancies affecting pregnancy, antepartum 04/15/2019   Allergic asthma 01/16/2019   Seasonal allergic rhinitis due to pollen 01/16/2019   History of kidney stones 01/16/2019   Intrinsic eczema 08/05/2018   Benign gestational thrombocytopenia in third trimester ([place]) 05/09/2018   Chronic hypertension affecting pregnancy 08/29/2017   Migraine 09/25/2011    Assessment/Plan:  Cynthia Crawford is a 29 y.o. G3P2002 at [redacted]w[redacted]d here for IOL d/t cHTN.  #  Labor: Risks and benefits of induction were reviewed, including failure of method, prolonged labor, need for further intervention, risk of cesarean.  Patient and family seem to understand these risks and wish to proceed. Options of cytotec, foley bulb, AROM, and pitocin reviewed, with use of each discussed. Plan to place cytotec now and FB when able. OB admit labs pending. #cHTN: Trace pedal edema, significant hand swelling present. Otherwise asymptomatic. Mildly elevated pressure on admission 145/91. PEC labs pending. #Pain: Per patient request. #FWB: Cat I; EFW: 7.5 lbs #ID:  GBS neg #MOF: breast #MOC: undecided, likely POPs #Circ:  Yes, inpatient, if boy, insurance [employer]  Shirlean Mylar, MD [facility] Family Medicine Res[facility], PGY-1 10/24/2019, 12:39 AM   GME ATTESTATION:  I saw and evaluated the patient. I agree with the findings and the plan of care as documented in the resident's note.  Marlowe Alt, DO OB Fellow, Faculty [facility], Center for [facility] 10/24/2019 1:54 AM

## 2019-10-24 NOTE — Anesthesia Preprocedure Evaluation (Signed)
Anesthesia Evaluation  Patient identified by MRN, date of birth, ID band Patient awake    Reviewed: Allergy & Precautions, Patient's Chart, lab work & pertinent test results, reviewed documented beta blocker date and time   Airway Mallampati: II  TM Distance: >3 FB Neck ROM: Full    Dental no notable dental hx. (+) Teeth Intact   Pulmonary asthma ,    Pulmonary exam normal breath sounds clear to auscultation       Cardiovascular hypertension, Pt. on medications and Pt. on home beta blockers Normal cardiovascular exam Rhythm:Regular Rate:Normal     Neuro/Psych  Headaches, negative psych ROS   GI/Hepatic Neg liver ROS, GERD  ,  Endo/Other  Obesity  Renal/GU Renal diseaseHx/o renal calculi  negative genitourinary   Musculoskeletal negative musculoskeletal ROS (+)   Abdominal (+) + obese,   Peds  Hematology Thrombocytopenia-gestational   Anesthesia Other Findings   Reproductive/Obstetrics (+) Pregnancy                             Anesthesia Physical Anesthesia Plan  ASA: II  Anesthesia Plan: Epidural   Post-op Pain Management:    Induction:   PONV Risk Score and Plan:   Airway Management Planned: Natural Airway  Additional Equipment:   Intra-op Plan:   Post-operative Plan:   Informed Consent: I have reviewed the patients History and Physical, chart, labs and discussed the procedure including the risks, benefits and alternatives for the proposed anesthesia with the patient or authorized representative who has indicated his/her understanding and acceptance.       Plan Discussed with: Anesthesiologist  Anesthesia Plan Comments:         Anesthesia Quick Evaluation

## 2019-10-24 NOTE — Lactation Note (Signed)
This note was copied from a baby's chart. Lactation Consultation Note  Patient Name: Cynthia Crawford [id number] Date: 10/24/2019 Reason for consult: Initial assessment;Term P3, 13 hour female infant.  Mom with Hx: GHTN Per mom, breastfeeding is going well and  infant is latching for  20 minutes most feedings. Mom has breastfed infant 4 times since birth. Infant had one void and three stools since birth. Mom is experienced at breastfeeding she breastfed her 1st child for 11 months and 2nd child who is 17 months for 9 months (close spaced pregnancies). Mom has two DEBP's at home a Medela and Spectra 2 LC did not observe latch at this time infant had finished breastfeeding one hour prior to [facility] entering the room. Mom will continue to breastfeed according to hunger cues, 8 to 12 times and not exceed 3 hours without breastfeeding infant. Parents will continue to do STS as much as possibel. Reviewed Baby & Me book's Breastfeeding Basics.  Mom made aware of O/P services, breastfeeding support groups, community resources, and our phone # for post-discharge questions.    Maternal Data Formula Feeding for Exclusion: No Has patient been taught Hand Expression?: Yes Does the patient have breastfeeding experience prior to this delivery?: Yes  Feeding Feeding Type: Breast Fed  LATCH Score                   Interventions Interventions: Breast feeding basics reviewed;Hand express;Expressed milk  Lactation Tools Discussed/Used WIC Program: No   Consult Status Consult Status: Follow-up Date: 11/01/19 Follow-up type: In-patient    Danelle Earthly 10/24/2019, 9:33 PM

## 2019-10-24 NOTE — Progress Notes (Signed)
Labor Progress Note Shaquanta Jolisa Intriago is a 29 y.o. G3P2002 at [redacted]w[redacted]d presented for IOL d/t cHTN S: patient resting comfortably, FB out  O:  BP 140/90   Pulse 79   Temp 99.1 F (37.3 C) (Oral)   Resp 16   Ht 5\' 4"  (1.626 m)   Wt 86.3 kg   LMP 01/24/2019 (Approximate)   BMI 32.66 kg/m  EFM: 130 bpm/+accels/-decels  CVE: Dilation: 4.5 Effacement (%): 60 Cervical Position: Anterior Station: -2 Presentation: Vertex Exam by:: Kasaundra Fahrney MD   A&P: 29 y.o. 26 [redacted]w[redacted]d here for IOL d/t cHTN #Labor: Progressing well. Plan to start pitocin now.  #Pain: Patient would like epidural #FWB: Cat I #GBS negative #cHTN: BP mildly elevated 140s/90s, patient remains asymptomatic other than peripheral hand edema. Continue to monitor. #gest thrombocytopenia: Platelets 126.  [redacted]w[redacted]d, MD 5:20 AM

## 2019-10-24 NOTE — Progress Notes (Signed)
"  Pod" external fetal monitors removed, U/S and toco applied

## 2019-10-24 NOTE — Anesthesia Postprocedure Evaluation (Signed)
Anesthesia Post Note  Patient: Cynthia Crawford  Procedure(s) Performed: AN AD HOC LABOR EPIDURAL     Patient location during evaluation: Mother Baby Anesthesia Type: Epidural Level of consciousness: awake and alert Pain management: pain level controlled Vital Signs Assessment: post-procedure vital signs reviewed and stable Respiratory status: spontaneous breathing, nonlabored ventilation and respiratory function stable Cardiovascular status: stable Postop Assessment: no headache, no backache and epidural receding Anesthetic complications: no    Last Vitals:  Vitals:   10/24/19 1028 10/24/19 1145  BP: 140/85 125/74  Pulse: 71 74  Resp: 18 18  Temp: 36.6 C 36.8 C  SpO2: 100%     Last Pain:  Vitals:   10/24/19 1145  TempSrc: Oral  PainSc: 0-No pain                 Trent Gabler

## 2019-10-24 NOTE — Discharge Summary (Signed)
Postpartum Discharge Summary     Patient Name: Cynthia Crawford DOB: Aug 31, 1991 MRN: [id number]  Date of admission: 10/24/2019 Delivering Provider: Wende Mott   Date of discharge: 10/25/2019  Admitting diagnosis: Chronic hypertension affecting pregnancy [O10.919] Intrauterine pregnancy: [redacted]w[redacted]d    Secondary diagnosis:  Active Problems:   Chronic hypertension affecting pregnancy   Short interval between pregnancies affecting pregnancy, antepartum   [redacted] weeks gestation of pregnancy  Additional problems: None     Discharge diagnosis: Term Pregnancy Delivered and CHTN                                                                                                Post partum procedures:None  Augmentation: AROM, Pitocin, Cytotec and Foley Balloon  Complications: None  Hospital course:  Induction of Labor With Vaginal Delivery   29y.o. yo G3P2002 at 37w0das admitted to the hospital 10/24/2019 for induction of labor.  Indication for induction: CHTN.  Patient had an uncomplicated labor course as follows: Membrane Rupture Time/Date: AROM 20 minutes before delivery Intrapartum Procedures: Episiotomy: None [1]                                         Lacerations:  1st degree [2]  Patient had delivery of a Viable infant.  Information for the patient's newborn:  AnArrabella, Westerman0[[id number]]Delivery Method: Vaginal, Spontaneous(Filed from Delivery Summary)    10/24/2019  Details of delivery can be found in separate delivery note.  Patient had a routine postpartum course. Labetalol discontinued and patient started on Vasotec 10 mg. Reports allergy to Norvasc as she was on this previously. She will monitor BP's at home with BP check in clinic in 1 week. Desires POPs on f/u, not at discharge. Patient is discharged home 10/25/19. Delivery time: 8:36 AM    Magnesium Sulfate received: No BMZ received: No Rhophylac:No MMR:No Transfusion:No  Physical exam  Vitals:   10/24/19  1947 10/25/19 0027 10/25/19 0503 10/25/19 1041  BP: 134/89 (!) 143/90 (!) 141/95 (!) 143/95  Pulse: 70 75 73   Resp: '16 17 18   ' Temp: 98 F (36.7 C) 98.1 F (36.7 C) 98.2 F (36.8 C)   TempSrc: Oral Oral Oral   SpO2: 100% 100% 100%   Weight:      Height:       General: alert, cooperative and no distress Lochia: appropriate Uterine Fundus: firm Incision: N/A DVT Evaluation: No evidence of DVT seen on physical exam. Labs: Lab Results  Component Value Date   WBC 10.6 (H) 10/25/2019   HGB 12.5 10/25/2019   HCT 37.3 10/25/2019   MCV 91.9 10/25/2019   PLT 118 (L) 10/25/2019   CMP Latest Ref Rng & Units 10/24/2019  Glucose 70 - 99 mg/dL 96  BUN 6 - 20 mg/dL 9  Creatinine 0.44 - 1.00 mg/dL 0.63  Sodium 135 - 145 mmol/L 137  Potassium 3.5 - 5.1 mmol/L 3.9  Chloride 98 - 111 mmol/L 108  CO2 22 -  32 mmol/L 18(L)  Calcium 8.9 - 10.3 mg/dL 9.2  Total Protein 6.5 - 8.1 g/dL 6.2(L)  Total Bilirubin 0.3 - 1.2 mg/dL 0.4  Alkaline Phos 38 - 126 U/L 90  AST 15 - 41 U/L 24  ALT 0 - 44 U/L 20    Discharge instruction: per After Visit Summary and "Baby and Me Booklet".  After visit meds:  Allergies as of 10/25/2019      Reactions   Penicillins Hives   Has patient had a PCN reaction causing immediate rash, facial/tongue/throat swelling, SOB or lightheadedness with hypotension: Yes Has patient had a PCN reaction causing severe rash involving mucus membranes or skin necrosis: No Has patient had a PCN reaction that required hospitalization No Has patient had a PCN reaction occurring within the last 10 years: No If all of the above answers are "NO", then may proceed with Cephalosporin use.   Sulfa Antibiotics Hives      Medication List    STOP taking these medications   aspirin EC 81 MG tablet   cyclobenzaprine 5 MG tablet Commonly known as: FLEXERIL   labetalol 100 MG tablet Commonly known as: NORMODYNE     TAKE these medications   acetaminophen 325 MG tablet Commonly known  as: Tylenol Take 2 tablets (650 mg total) by mouth every 6 (six) hours as needed (for pain scale < 4).   cetirizine 10 MG tablet Commonly known as: ZYRTEC Take 1 tablet (10 mg total) by mouth daily.   enalapril 10 MG tablet Commonly known as: VASOTEC Take 1 tablet (10 mg total) by mouth daily. Start taking on: October 26, 2019   ibuprofen 600 MG tablet Commonly known as: ADVIL Take 1 tablet (600 mg total) by mouth every 6 (six) hours.   prenatal multivitamin Tabs tablet Take 1 tablet by mouth at bedtime.       Diet: routine diet  Activity: Advance as tolerated. Pelvic rest for 6 weeks.   Outpatient follow up:4 weeks Follow up Appt: Future Appointments  Date Time Provider [place]  11/24/2019 10:00 AM Donnamae Jude, MD [facility]   Follow up Visit: Please schedule this patient for Postpartum visit in: 4 weeks with the following provider: Any provider Virtual For C/S patients schedule nurse incision check in weeks 2 weeks: no High risk pregnancy complicated by: HTN Delivery mode:  SVD Anticipated Birth Control:  POPs PP Procedures needed: BP check  Schedule Integrated BH visit: no  Newborn Data: Live born female  Birth Weight: 3110g  APGAR (1 MIN): 9   APGAR (5 MINS): 9   APGAR (10 MINS):    Newborn Delivery   Birth date/time: 10/24/2019 08:36:00 Delivery type: Vaginal, Spontaneous      Baby Feeding: Breast Disposition:home with mother

## 2019-10-24 NOTE — Progress Notes (Deleted)
OBSTETRIC ADMISSION HISTORY AND PHYSICAL  Cynthia Crawford is a 29 y.o. female G3P2002 with IUP at [redacted]w[redacted]d by LMP c/w 7wk [place] presenting for IOL d/t cHTN. She reports +FMs, No LOF, no VB, no blurry vision, headaches, and RUQ pain.  She does have peripheral edema in her hands and feet. She plans on breast feeding. She requests POPs for birth control. She received her prenatal care at [facility]  Dating: By LMP c/w 7wk [place] --->  Estimated Date of Delivery: 10/31/19  Sono:    @[redacted]w[redacted]d , CWD, normal anatomy, variable presentation, posterior lie, 288g, 58% EFW @[redacted]w[redacted]d , CWD, PRUV, cephalic presentation, posterior placental, 1144g, 33%EFW  Prenatal History/Complications: CHTN, on ASA and labetalol 100mg  BID Gestational thrombocytopenia Short interval pregnancy Asthma PRUV with negative quad screen  Past Medical History: Past Medical History:  Diagnosis Date  . Asthma   . Chronic hypertension during pregnancy, antepartum 08/29/2017   [x]  Aspirin 81 mg daily after 12 weeks; discontinue after 36 weeks Current antihypertensives:  None  Was on Enalapril prior to pregnancy  Baseline and surveillance labs (pulled in from [facility], refresh links as needed)  Lab Results Component Value Date  PLT 213 10/16/2017  CREATININE 0.61 10/16/2017  AST 17 10/16/2017  ALT 13 10/16/2017  PROTCRRATIO 0.21 (H) 01/17/2016  PROTEIN24HR 242 (H) 01/16/2016  . Contact dermatitis due to soap 11/20/2017  . History of gestational hypertension   . Kidney stones     Past Surgical History: Past Surgical History:  Procedure Laterality Date  . TONSILLECTOMY      Obstetrical History: OB History    Gravida  3   Para  2   Term  2   Preterm  0   AB  0   Living  2     SAB  0   TAB  0   Ectopic  0   Multiple  0   Live Births  2           Social History: Social History   Socioeconomic History  . Marital status: Married    Spouse name: Not on file  . Number of children: 1  . Years of education: Not on  file  . Highest education level: Not on file  Occupational History  . Not on file  Tobacco Use  . Smoking status: Never Smoker  . Smokeless tobacco: Never Used  Substance and Sexual Activity  . Alcohol use: No  . Drug use: No  . Sexual activity: Yes    Partners: Male    Birth control/protection: None    Comment: one month ago  Other Topics Concern  . Not on file  Social History Narrative  . Not on file   Social Determinants of Health   Financial Resource Strain:   . Difficulty of Paying Living Expenses: Not on file  Food Insecurity:   . Worried About Charity fundraiser in the Last Year: Not on file  . Ran Out of Food in the Last Year: Not on file  Transportation Needs:   . Lack of Transportation (Medical): Not on file  . Lack of Transportation (Non-Medical): Not on file  Physical Activity:   . Days of Exercise per Week: Not on file  . Minutes of Exercise per Session: Not on file  Stress:   . Feeling of Stress : Not on file  Social Connections:   . Frequency of Communication with Friends and Family: Not on file  . Frequency of Social Gatherings with Friends and  Family: Not on file  . Attends Religious Services: Not on file  . Active Member of Clubs or Organizations: Not on file  . Attends Banker Meetings: Not on file  . Marital Status: Not on file    Family History: Family History  Problem Relation Age of Onset  . Diabetes Father   . Asthma Mother     Allergies: Allergies  Allergen Reactions  . Penicillins Hives    Has patient had a PCN reaction causing immediate rash, facial/tongue/throat swelling, SOB or lightheadedness with hypotension: Yes Has patient had a PCN reaction causing severe rash involving mucus membranes or skin necrosis: No Has patient had a PCN reaction that required hospitalization No Has patient had a PCN reaction occurring within the last 10 years: No If all of the above answers are "NO", then may proceed with Cephalosporin  use.  . Sulfa Antibiotics Hives    Medications Prior to Admission  Medication Sig Dispense Refill Last Dose  . aspirin EC 81 MG tablet Take 1 tablet (81 mg total) by mouth daily. Take after 12 weeks for prevention of preeclampsia later in pregnancy (about April 18, 2019) 300 tablet 2   . cetirizine (ZYRTEC) 10 MG tablet Take 1 tablet (10 mg total) by mouth daily. 30 tablet 11   . cyclobenzaprine (FLEXERIL) 5 MG tablet Take 1 tablet (5 mg total) by mouth 3 (three) times daily as needed for muscle spasms. (Patient not taking: Reported on 10/14/2019) 20 tablet 0   . labetalol (NORMODYNE) 100 MG tablet TAKE 1 TABLET BY MOUTH TWICE A DAY 180 tablet 1   . Prenatal Vit-Fe Fumarate-FA (PRENATAL MULTIVITAMIN) TABS tablet Take 1 tablet by mouth at bedtime.        Review of Systems   All systems reviewed and negative except as stated in HPI  Last menstrual period 01/24/2019, currently breastfeeding. General appearance: alert, cooperative, appears stated age and no distress Lungs: normal effort Heart: regular rate  Abdomen: soft, non-tender; bowel sounds normal Pelvic: gravid uterus GU: No vaginal lesions  Extremities: Homans sign is negative, no sign of DVT DTR's intact Presentation: cephalic Fetal monitoringBaseline: 135 bpm, Variability: Good {> 6 bpm), Accelerations: Reactive and Decelerations: Absent Uterine activity: None     Prenatal labs: ABO, Rh: O/Positive/-- (07/29 0953) Antibody: Negative (07/29 0953) Rubella: 3.91 (07/29 0953) RPR: Non Reactive (11/17 0850)  HBsAg: Negative (07/29 0953)  HIV: Non Reactive (11/17 0850)  GBS: Negative/-- (01/20 1105)  2 hr Glucola 78 Genetic screening  Quad negative Anatomy US PRUV, otherwise normal  Prenatal Transfer Tool  Maternal Diabetes: No Genetic Screening: Normal Maternal Ultrasounds/Referrals: Other:PRUV Fetal Ultrasounds or other Referrals:  Referred to Materal Fetal Medicine  Maternal Substance Abuse:  No Significant  Maternal Medications:  Meds include: Other: ASA, labetalol Significant Maternal Lab Results: Group B Strep negative  No results found for this or any previous visit (from the past 24 hour(s)).  Patient Active Problem List   Diagnosis Date Noted  . Supervision of high risk pregnancy, antepartum 04/15/2019  . Short interval between pregnancies affecting pregnancy, antepartum 04/15/2019  . Allergic asthma 01/16/2019  . Seasonal allergic rhinitis due to pollen 01/16/2019  . History of kidney stones 01/16/2019  . Intrinsic eczema 08/05/2018  . Benign gestational thrombocytopenia in third trimester (HCC) 05/09/2018  . Chronic hypertension affecting pregnancy 08/29/2017  . Migraine 09/25/2011    Assessment/Plan:  Laquonda Welby is a 29 y.o. G3P2002 at [redacted]w[redacted]d here for IOL d/t cHTN.  #Labor: Risks  and benefits of induction were reviewed, including failure of method, prolonged labor, need for further intervention, risk of cesarean.  Patient and family seem to understand these risks and wish to proceed. Options of cytotec, foley bulb, AROM, and pitocin reviewed, with use of each discussed. Plan to place cytotec now and FB when able. OB admit labs pending. #cHTN: Trace pedal edema, significant hand swelling present. Otherwise asymptomatic. Mildly elevated pressure on admission 145/91. PEC labs pending. #Pain: Per patient request. #FWB: Cat I; EFW: 7.5 lbs #ID:  GBS neg #MOF: breast #MOC: undecided, likely POPs #Circ:  Yes, inpatient, if boy, insurance [employer]  Shirlean Mylar, MD [facility] Family Medicine Residency, PGY-1 10/24/2019, 12:39 AM

## 2019-10-25 DIAGNOSIS — Z3A39 39 weeks gestation of pregnancy: Secondary | ICD-10-CM

## 2019-10-25 LAB — CBC
HCT: 37.3 % (ref 36.0–46.0)
Hemoglobin: 12.5 g/dL (ref 12.0–15.0)
MCH: 30.8 pg (ref 26.0–34.0)
MCHC: 33.5 g/dL (ref 30.0–36.0)
MCV: 91.9 fL (ref 80.0–100.0)
Platelets: 118 10*3/uL — ABNORMAL LOW (ref 150–400)
RBC: 4.06 MIL/uL (ref 3.87–5.11)
RDW: 13.2 % (ref 11.5–15.5)
WBC: 10.6 10*3/uL — ABNORMAL HIGH (ref 4.0–10.5)
nRBC: 0 % (ref 0.0–0.2)

## 2019-10-25 MED ORDER — ACETAMINOPHEN 325 MG PO TABS: 650.0000 mg | ORAL_TABLET | Freq: Four times a day (QID) | ORAL | 0 refills | Status: DC | PRN

## 2019-10-25 MED ORDER — ENALAPRIL MALEATE 5 MG PO TABS
10.0000 mg | ORAL_TABLET | Freq: Every day | ORAL | Status: DC
  Filled 2019-10-25: qty 2

## 2019-10-25 MED ORDER — ENALAPRIL MALEATE 5 MG PO TABS
10.0000 mg | ORAL_TABLET | Freq: Every day | ORAL | Status: DC
  Administered 2019-10-25: 10 mg via ORAL

## 2019-10-25 MED ORDER — IBUPROFEN 600 MG PO TABS: 600.0000 mg | ORAL_TABLET | Freq: Four times a day (QID) | ORAL | 0 refills | Status: DC

## 2019-10-25 MED ORDER — ENALAPRIL MALEATE 10 MG PO TABS: 10.0000 mg | ORAL_TABLET | Freq: Every day | ORAL | 0 refills | Status: DC

## 2019-10-25 NOTE — Lactation Note (Signed)
This note was copied from a baby's chart. Lactation Consultation Note  Patient Name: Cynthia Crawford RUEAV'W Date: 10/25/2019 Reason for consult: Follow-up assessment;Term;Infant weight loss  Visited with mom of a 22 hours old FT female, mom and baby are going home, baby is at 4% weight loss. Mom told LC that baby was circumcised this morning and that he was too sleepy to go to the breast. Explained to mom about the 4-6 sleepy period after circumsicion and encouraged her to hand express finger feed baby if he's still too sleepy to latch on. She had about 1 ml of colostrum in the snappy and voiced understanding.  Per mom BF is going well, her last LATCH score is 10. She has a 36 month old at home and still remembers all the tricks and tips about it. Her goal is to fully BF, she told LC that baby's stools are already changing colors, the pediatrician noticed it too during the last diaper change. Reviewed discharge instructions, engorgement prevention and treatment, treatment/prevention for sore nipples and red flags on when to call baby's pediatrician. Mom didn't have a support person in her room at this time. She reported all questions and concerns were answered, she's aware of LC OP services and will call PRN.   Maternal Data    Feeding Feeding Type: Breast Fed  LATCH Score Latch: Grasps breast easily, tongue down, lips flanged, rhythmical sucking.  Audible Swallowing: Spontaneous and intermittent  Type of Nipple: Everted at rest and after stimulation  Comfort (Breast/Nipple): Soft / non-tender  Hold (Positioning): No assistance needed to correctly position infant at breast.  LATCH Score: 10  Interventions Interventions: Breast feeding basics reviewed  Lactation Tools Discussed/Used     Consult Status Consult Status: Complete Date: 10/25/19 Follow-up type: In-patient    Cynthia Crawford Venetia Constable 10/25/2019, 11:38 AM

## 2019-10-25 NOTE — Progress Notes (Addendum)
POSTPARTUM PROGRESS NOTE Day 1  Subjective: Cynthia Crawford is a 29 y.o. [id number] s/p VD at [redacted]w[redacted]d, admitted for IOL for cHTN. She reports she doing well. No acute events overnight. She denies any problems with ambulating, voiding or po intake. Denies nausea or vomiting. She has passed flatus. Pain is well controlled.  Lochia is minimal. Patient states she feels ready to go home today and states that her PCP has managed her BP closely after her prior pregnancies, and will do so after this one as well.   Objective: Blood pressure (!) 143/95, pulse 73, temperature 98.2 F (36.8 C), temperature source Oral, resp. rate 18, height 5\' 4"  (1.626 m), weight 86.3 kg, last menstrual period 01/24/2019, SpO2 100 %, unknown if currently breastfeeding.  Physical Exam:  General: alert, cooperative and no distress Chest: no respiratory distress, clear breath sounds throughout, regular heart rate Abdomen: soft, non-tender  Uterine Fundus: firm, appropriately tender Extremities: No calf swelling or tenderness  no pedal edema  Recent Labs    10/24/19 0938 10/25/19 0455  HGB 12.7 12.5  HCT 38.5 37.3    Assessment/Plan: Cynthia Crawford is a 29 y.o. G3P3003 s/p VD at [redacted]w[redacted]d for IOL for cHTN.  Routine Postpartum Care: Doing well, pain well-controlled.  -- Continue routine care, lactation support  -- Contraception: POPs -- Feeding: Normal diet  CHTN: Mildly elevated in the 130-140s/80-90s. Previously on Labetalol during pregnancy. -- Start antihypertensive prior to d/c -- 1 week BP check  Dispo: Plan for discharge today if pt continues to do well without any complaints or complications.  Faris Almubaslat, Ms3 10/25/2019, 7:53 AM  I saw and evaluated the patient. I agree with the findings and the plan of care as documented in the student's note. Discussed blood pressures in detail. She has taken Norvasc with adverse effects (lips swelling). Labetalol was discontinued post-partum.  Enalapril 10 mg initiated today. BP check in 1 week. Plan for DC today if baby can go. Circ done today. Desires POPs prescribed outpatient and not on DC.  April 12, MD [facility] Family Medicine Fellow, [facility] for [facility]

## 2019-11-02 ENCOUNTER — Other Ambulatory Visit: Payer: Self-pay

## 2019-11-02 ENCOUNTER — Ambulatory Visit (INDEPENDENT_AMBULATORY_CARE_PROVIDER_SITE_OTHER): Payer: 59 | Admitting: *Deleted

## 2019-11-02 VITALS — BP 138/87 | HR 65

## 2019-11-02 DIAGNOSIS — O10919 Unspecified pre-existing hypertension complicating pregnancy, unspecified trimester: Secondary | ICD-10-CM

## 2019-11-02 NOTE — Progress Notes (Signed)
Subjective:  Cynthia Crawford is a 29 y.o. female here for BP check.   Hypertension ROS: taking medications as instructed, no medication side effects noted, no TIA's, no chest pain on exertion, no dyspnea on exertion and no swelling of ankles.    Objective:  BP 138/87   Pulse 65   Appearance alert, well appearing, and in no distress. General exam BP noted to be well controlled today in office.    Assessment:   Blood Pressure stable.   Plan:  Current treatment plan is effective, no change in therapy.Marland Kitchen

## 2019-11-02 NOTE — Progress Notes (Signed)
Patient seen and assessed by nursing staff.  Agree with documentation and plan.  

## 2019-11-04 ENCOUNTER — Encounter: Payer: Self-pay | Admitting: *Deleted

## 2019-11-11 ENCOUNTER — Telehealth: Payer: Self-pay | Admitting: Family Medicine

## 2019-11-11 NOTE — Telephone Encounter (Signed)
Patient is scheduled to have covid 19 vaccine on Saturday but would like advice from Dr. Beryle Flock first. She inquired if it would be save to have vaccine while breast feeding.

## 2019-11-12 NOTE — Telephone Encounter (Signed)
Telephone outreach to Cynthia Crawford this morning. HIPAA identifiers verified.  CDC notes that there is no data from clinical trials on mRNA vaccines/the COVID19 vaccine on pregnant or lactating women, however it is not thought that mRNA vaccines are a risk to the breastfeeding infant. Per CDC, it is ultimately up to the mother to decide if she feels comfortable with the vaccine while breastfeeding.    Recommended that Cynthia Crawford seek guidance from her ob/gyn for further discussion.   Counseled patient on possible side effects of the COVID19 vaccine, including painful injection, aches, low fever, and to be mindful of using ibuprofen to treat symptoms while breastfeeding (timing of dose vs timing of feeding).    Cynthia Crawford, PharmD Clinical Pharmacist [facility] [phone]

## 2019-11-12 NOTE — Telephone Encounter (Signed)
Cynthia Crawford,  Would you advise ?

## 2019-11-24 ENCOUNTER — Other Ambulatory Visit: Payer: Self-pay

## 2019-11-24 ENCOUNTER — Ambulatory Visit (INDEPENDENT_AMBULATORY_CARE_PROVIDER_SITE_OTHER): Payer: 59 | Admitting: Family Medicine

## 2019-11-24 DIAGNOSIS — I10 Essential (primary) hypertension: Secondary | ICD-10-CM

## 2019-11-24 NOTE — Patient Instructions (Signed)
Costochondritis Costochondritis is swelling and irritation (inflammation) of the tissue (cartilage) that connects your ribs to your breastbone (sternum). This causes pain in the front of your chest. Usually, the pain:  Starts gradually.  Is in more than one rib. This condition usually goes away on its own over time. Follow these instructions at home:  Do not do anything that makes your pain worse.  If directed, put ice on the painful area: ? Put ice in a plastic bag. ? Place a towel between your skin and the bag. ? Leave the ice on for 20 minutes, 2-3 times a day.  If directed, put heat on the affected area as often as told by your doctor. Use the heat source that your doctor tells you to use, such as a moist heat pack or a heating pad. ? Place a towel between your skin and the heat source. ? Leave the heat on for 20-30 minutes. ? Take off the heat if your skin turns bright red. This is very important if you cannot feel pain, heat, or cold. You may have a greater risk of getting burned.  Take over-the-counter and prescription medicines only as told by your doctor.  Return to your normal activities as told by your doctor. Ask your doctor what activities are safe for you.  Keep all follow-up visits as told by your doctor. This is important. Contact a doctor if:  You have chills or a fever.  Your pain does not go away or it gets worse.  You have a cough that does not go away. Get help right away if:  You are short of breath. This information is not intended to replace advice given to you by your health care provider. Make sure you discuss any questions you have with your health care provider. Document Revised: 09/18/2017 Document Reviewed: 12/28/2015 Elsevier Patient Education  2020 Elsevier Inc.  

## 2019-11-24 NOTE — Progress Notes (Signed)
Post Partum Exam  Cynthia Crawford is a 29 y.o. G42P3003 female who presents for a postpartum visit. She is 4 weeks  postpartum following a vaginal delivery: I have fully reviewed the prenatal and intrapartum course. The delivery was at 39 gestational weeks.  Anesthesia: epidural. Postpartum course has been uncomplicated. Baby's course has been . Baby is feeding by breast. Bleeding not at this time. Bowel function is normal. Bladder function is normal. Patient is not  sexually active. Contraception method is nothing at this time. Postpartum depression screening:negative Has some chest discomfort and h/o costochondritis. Feels like this. Reports some mild SOB. Denies hemoptysis, leg swelling. Has tried tylenol which is not helping.  The following portions of the patient's history were reviewed and updated as appropriate: allergies, current medications, past family history, past medical history, past social history, past surgical history and problem list. Last pap smear done on 10/23/2017 and was normal  Review of Systems Pertinent items noted in HPI and remainder of comprehensive ROS otherwise negative.    Objective:  Blood pressure 130/85, pulse 62, weight 168 lb (76.2 kg), unknown if currently breastfeeding. Oxygen saturation 98% on room air  General:  alert, cooperative and appears stated age  Lungs: clear to auscultation bilaterally  Heart:  regular rate and rhythm, S1, S2 normal, no murmur, click, rub or gallop  Abdomen: soft, non-tender; bowel sounds normal; no masses,  no organomegaly        Assessment:    Normal postpartum exam. Pap smear not done at today's visit.   Plan:   1. Contraception: none Discussed all options, she is uninterested in most. Consider vasectomy. 2. Pap due 10/2020-->will complete at CPE next visit 3. Suspect costochondritis, given normal exam ad O2--trial of ibuprofen--doubt PE 4. Continue Vasotec--advised to not get pregnant on this. 5. Follow up in: 3  months for CPE or as needed.

## 2019-11-26 ENCOUNTER — Encounter: Payer: Self-pay | Admitting: Physician Assistant

## 2019-11-26 ENCOUNTER — Other Ambulatory Visit
Admission: RE | Admit: 2019-11-26 | Discharge: 2019-11-26 | Disposition: A | Discharge status: Home / Self Care | Payer: 59 | Source: Ambulatory Visit | Attending: Physician Assistant | Admitting: Physician Assistant

## 2019-11-26 ENCOUNTER — Ambulatory Visit (INDEPENDENT_AMBULATORY_CARE_PROVIDER_SITE_OTHER): Payer: 59 | Admitting: Physician Assistant

## 2019-11-26 ENCOUNTER — Ambulatory Visit
Admission: RE | Admit: 2019-11-26 | Discharge: 2019-11-26 | Disposition: A | Discharge status: Home / Self Care | Payer: 59 | Source: Ambulatory Visit | Attending: Physician Assistant | Admitting: Physician Assistant

## 2019-11-26 DIAGNOSIS — R079 Chest pain, unspecified: Secondary | ICD-10-CM

## 2019-11-26 DIAGNOSIS — J4520 Mild intermittent asthma, uncomplicated: Secondary | ICD-10-CM

## 2019-11-26 LAB — CBC WITH DIFFERENTIAL/PLATELET
Abs Immature Granulocytes: 0.01 10*3/uL (ref 0.00–0.07)
Basophils Absolute: 0 10*3/uL (ref 0.0–0.1)
Basophils Relative: 0 %
Eosinophils Absolute: 0.1 10*3/uL (ref 0.0–0.5)
Eosinophils Relative: 2 %
HCT: 41.5 % (ref 36.0–46.0)
Hemoglobin: 13.5 g/dL (ref 12.0–15.0)
Immature Granulocytes: 0 %
Lymphocytes Relative: 28 %
Lymphs Abs: 2.1 10*3/uL (ref 0.7–4.0)
MCH: 30 pg (ref 26.0–34.0)
MCHC: 32.5 g/dL (ref 30.0–36.0)
MCV: 92.2 fL (ref 80.0–100.0)
Monocytes Absolute: 0.3 10*3/uL (ref 0.1–1.0)
Monocytes Relative: 4 %
Neutro Abs: 4.8 10*3/uL (ref 1.7–7.7)
Neutrophils Relative %: 66 %
Platelets: 148 10*3/uL — ABNORMAL LOW (ref 150–400)
RBC: 4.5 MIL/uL (ref 3.87–5.11)
RDW: 12.3 % (ref 11.5–15.5)
WBC: 7.4 10*3/uL (ref 4.0–10.5)
nRBC: 0 % (ref 0.0–0.2)

## 2019-11-26 LAB — COMPREHENSIVE METABOLIC PANEL
ALT: 20 U/L (ref 0–44)
AST: 21 U/L (ref 15–41)
Albumin: 4.5 g/dL (ref 3.5–5.0)
Alkaline Phosphatase: 71 U/L (ref 38–126)
Anion gap: 8 (ref 5–15)
BUN: 11 mg/dL (ref 6–20)
CO2: 26 mmol/L (ref 22–32)
Calcium: 9.1 mg/dL (ref 8.9–10.3)
Chloride: 107 mmol/L (ref 98–111)
Creatinine, Ser: 0.8 mg/dL (ref 0.44–1.00)
GFR calc Af Amer: >60 mL/min (ref >60)
GFR calc non Af Amer: >60 mL/min (ref >60)
Glucose, Bld: 144 mg/dL — ABNORMAL HIGH (ref 70–99)
Potassium: 3.4 mmol/L — ABNORMAL LOW (ref 3.5–5.1)
Sodium: 141 mmol/L (ref 135–145)
Total Bilirubin: 0.5 mg/dL (ref 0.3–1.2)
Total Protein: 8.3 g/dL — ABNORMAL HIGH (ref 6.5–8.1)

## 2019-11-26 LAB — FIBRIN DERIVATIVES D-DIMER (ARMC ONLY): Fibrin derivatives D-dimer (ARMC): 243.56 ng/mL (FEU) (ref 0.00–499.00)

## 2019-11-26 MED ORDER — PREDNISONE 20 MG PO TABS: 20.0000 mg | ORAL_TABLET | Freq: Every day | ORAL | 0 refills | Status: AC

## 2019-11-26 NOTE — Progress Notes (Signed)
Patient: Cynthia Crawford Female    DOB: [birth year]   29 y.o.   MRN: [id number] Visit Date: 11/26/2019  Today's Provider: Trey Sailors, PA-C   Chief Complaint  Patient presents with  . Asthma   Subjective:    Virtual Visit via Video Note  I connected with Tomeshia Kynli Chou on 11/26/19 at  2:40 PM EST by a video enabled telemedicine application and verified that I am speaking with the correct person using two identifiers.  Location: Patient: Home Provider: Office   I discussed the limitations of evaluation and management by telemedicine and the availability of in person appointments. The patient expressed understanding and agreed to proceed.  Asthma She complains of cough. Her past medical history is significant for asthma.   She reports she has not had an asthma attack in 3 years but most recently over the past week she has felt chest heaviness and tightness. She has some shortness of breath.  She reports this is worse when she lies down. She is not having much wheezing. She has been using her inhaler. She denies palpitations. Of note, she is approximately 4 weeks post partum and she is currently breast feeding.   Allergies  Allergen Reactions  . Penicillins Hives    Has patient had a PCN reaction causing immediate rash, facial/tongue/throat swelling, SOB or lightheadedness with hypotension: Yes Has patient had a PCN reaction causing severe rash involving mucus membranes or skin necrosis: No Has patient had a PCN reaction that required hospitalization No Has patient had a PCN reaction occurring within the last 10 years: No If all of the above answers are "NO", then may proceed with Cephalosporin use.  . Sulfa Antibiotics Hives     Current Outpatient Medications:  .  enalapril (VASOTEC) 10 MG tablet, Take 1 tablet (10 mg total) by mouth daily., Disp: 60 tablet, Rfl: 0 .  Prenatal Vit-Fe Fumarate-FA (PRENATAL MULTIVITAMIN) TABS tablet, Take 1 tablet by  mouth at bedtime., Disp: , Rfl:   Review of Systems  Constitutional: Negative.   Respiratory: Positive for cough.   Cardiovascular: Negative.   Musculoskeletal: Negative.     Social History   Tobacco Use  . Smoking status: Never Smoker  . Smokeless tobacco: Never Used  Substance Use Topics  . Alcohol use: No      Objective:   There were no vitals taken for this visit. There were no vitals filed for this visit.There is no height or weight on file to calculate BMI.   Physical Exam Pulmonary:     Comments: Patient is speaking in full sentences without pause.      No results found for any visits on 11/26/19.     Assessment & Plan    1. Mild intermittent extrinsic asthma without complication  - predniSONE (DELTASONE) 20 MG tablet; Take 1 tablet (20 mg total) by mouth daily with breakfast for 5 days.  Dispense: 5 tablet; Refill: 0 - DG Chest 2 View; Future  2. Chest pain, unspecified type  Counseled patient I cannot do an extensive workup outpatient as I feel she might require. Patient does not want to go to the ER and so I will get workup as below to r/o PE, pneumonia, COVID, etc. Advised I cannot get an EKG as an outpatient. In the meantime we will start prednisone burst. Strict return precautions advised.   Her D dimer is negative and CXR is negative. I have reviewed these images and labwork personally and  agree with the findings. Patient reports she is feeling better since starting prednisone.  - CBC with Differential - Comprehensive Metabolic Panel (CMET) - D-Dimer, Quantitative - DG Chest 2 View; Future  I discussed the assessment and treatment plan with the patient. The patient was provided an opportunity to ask questions and all were answered. The patient agreed with the plan and demonstrated an understanding of the instructions.   The patient was advised to call back or seek an in-person evaluation if the symptoms worsen or if the condition fails to improve as  anticipated.  I provided 25 minutes of non-face-to-face time during this encounter.  The entirety of the information documented in the History of Present Illness, Review of Systems and Physical Exam were personally obtained by me. Portions of this information were initially documented by Elonda Husky, CMA and reviewed by me for thoroughness and accuracy.      Trinna Post, PA-C  [facility]

## 2019-11-27 ENCOUNTER — Encounter: Payer: Self-pay | Admitting: Physician Assistant

## 2019-11-27 DIAGNOSIS — I10 Essential (primary) hypertension: Secondary | ICD-10-CM

## 2019-11-27 MED ORDER — ENALAPRIL MALEATE 10 MG PO TABS: 10.0000 mg | ORAL_TABLET | Freq: Every day | ORAL | 0 refills | Status: DC

## 2019-11-27 NOTE — Telephone Encounter (Signed)
I think this was supposed to be sent to you. Thanks!

## 2019-11-27 NOTE — Telephone Encounter (Signed)
Forwarding this one too

## 2019-11-27 NOTE — Telephone Encounter (Signed)
Copied from CRM [id number]. Topic: General - Other >> Nov 26, 2019  4:50 PM Dalphine Handing A wrote: Patient is currently at [facility] and needs the xray order sent over theophylline office stated that they didn't receive the order this afternoon since her appt. Please advise

## 2020-01-12 ENCOUNTER — Ambulatory Visit (INDEPENDENT_AMBULATORY_CARE_PROVIDER_SITE_OTHER): Payer: 59 | Admitting: Physician Assistant

## 2020-01-12 ENCOUNTER — Ambulatory Visit: Payer: Self-pay | Admitting: *Deleted

## 2020-01-12 ENCOUNTER — Other Ambulatory Visit: Payer: Self-pay | Admitting: Physician Assistant

## 2020-01-12 DIAGNOSIS — T7840XS Allergy, unspecified, sequela: Secondary | ICD-10-CM

## 2020-01-12 DIAGNOSIS — J4520 Mild intermittent asthma, uncomplicated: Secondary | ICD-10-CM | POA: Diagnosis not present

## 2020-01-12 MED ORDER — FLUTICASONE PROPIONATE 50 MCG/ACT NA SUSP: 2.0000 | Freq: Every day | NASAL | 6 refills | Status: DC

## 2020-01-12 NOTE — Telephone Encounter (Signed)
   Notes to clinic:  Alternative Requested:NOT COVERED PLS PROVIDE ALTERNATIVE.   Requested Prescriptions  Pending Prescriptions Disp Refills   flunisolide (NASALIDE) 25 MCG/ACT (0.025%) SOLN [Pharmacy Med Name: FLUNISOLIDE 0.025% SPRAY]  0    Sig: Please specify directions, refills and quantity      Ear, Nose, and Throat: Nasal Preparations - Corticosteroids Failed - 01/12/2020  3:00 PM      Failed - Valid encounter within last 12 months    Recent Outpatient Visits           Today Mild intermittent extrinsic asthma without complication   [facility] [place], Ricki Rodriguez M, PA-C   1 month ago Mild intermittent extrinsic asthma without complication   [facility] Osvaldo Angst M, [place]   12 months ago Essential hypertension   [facility] [place], Marzella Schlein, MD   1 year ago Flu-like symptoms   [facility] [place], Marzella Schlein, MD   1 year ago Essential hypertension   [facility] [place], Marzella Schlein, MD

## 2020-01-12 NOTE — Telephone Encounter (Signed)
Pt called stating she is having problems breathing due to asthma, sinus problems,  sneezing, sinus headache and congestion; she took prednisone, that was previously prescribed 11/26/19, and it helped; she feels like her asthma is flaring up; the pt says her breathing problems are due to sinus congestion which started on 01/10/20; she says that she has a productive cough that started today, and her secretions are yellow; recommendations made per nurse triage protocol; the pt states that she "will continue to doctor on herself because sis not going to Urgent Care or ED; the pt requested a virtual appt with any provider; decision tree completed; she normally sees Dr Brita Romp, [facility], but she has no availability; pt offered and accepted virtual appt with Carles Collet, 01/12/20 at 1440; hje verbalized understanding; will rooute to office for notification.  Reason for Disposition . [1] MILD difficulty breathing (e.g., minimal/no SOB at rest, SOB with walking, pulse <100) AND [2] NEW-onset or WORSE than normal  Answer Assessment - Initial Assessment Questions 1. LOCATION: "Where does it hurt?"      bridge of nose between her eyes, and the  Top of her head 2. ONSET: "When did the sinus pain start?"  (e.g., hours, days)     01/10/20 3. SEVERITY: "How bad is the pain?"   (Scale 1-10; mild, moderate or severe)   - MILD (1-3): doesn't interfere with normal activities    - MODERATE (4-7): interferes with normal activities (e.g., work or school) or awakens from sleep   - SEVERE (8-10): excruciating pain and patient unable to do any normal activities       4 out of 10 4. RECURRENT SYMPTOM: "Have you ever had sinus problems before?" If so, ask: "When was the last time?" and "What happened that time?"    Yes due to pollen; 2020 5. NASAL CONGESTION: "Is the nose blocked?" If so, ask, "Can you open it or must you breathe through the mouth?"     yes 6. NASAL DISCHARGE: "Do you have discharge from your  nose?" If so ask, "What color?"    no 7. FEVER: "Do you have a fever?" If so, ask: "What is it, how was it measured, and when did it start?"    no 8. OTHER SYMPTOMS: "Do you have any other symptoms?" (e.g., sore throat, cough, earache, difficulty breathing)     Asthma using albuterol inhaler but it is not working a well as it normally does 9. PREGNANCY: "Is there any chance you are pregnant?" "When was your last menstrual period?"     Pt is breastfeeding, LMP 12/28/19  Answer Assessment - Initial Assessment Questions 1. RESPIRATORY STATUS: "Describe your breathing?" (e.g., wheezing, shortness of breath, unable to speak, severe coughing)      Occasional wheezing 2. ONSET: "When did this breathing problem begin?"      4/221 3. PATTERN "Does the difficult breathing come and go, or has it been constant since it started?"     intermittent 4. SEVERITY: "How bad is your breathing?" (e.g., mild, moderate, severe)    - MILD: No SOB at rest, mild SOB with walking, speaks normally in sentences, can lay down, no retractions, pulse < 100.    - MODERATE: SOB at rest, SOB with minimal exertion and prefers to sit, cannot lie down flat, speaks in phrases, mild retractions, audible wheezing, pulse 100-120.    - SEVERE: Very SOB at rest, speaks in single words, struggling to breathe, sitting hunched forward, retractions, pulse > 120  yes 5. RECURRENT SYMPTOM: "Have you had difficulty breathing before?" If so, ask: "When was the last time?" and "What happened that time?"     Yes visit 11/26/19; also when pollen is hgh 6. CARDIAC HISTORY: "Do you have any history of heart disease?" (e.g., heart attack, angina, bypass surgery, angioplasty)     no 7. LUNG HISTORY: "Do you have any history of lung disease?"  (e.g., pulmonary embolus, asthma, emphysema)    asthma 8. CAUSE: "What do you think is causing the breathing problem?"      pollen 9. OTHER SYMPTOMS: "Do you have any other symptoms? (e.g., dizziness,  runny nose, cough, chest pain, fever)   Nasal and sinus congestion, headache 10. PREGNANCY: "Is there any chance you are pregnant?" "When was your last menstrual period?"      Pt is breastfeeding LMP 12/27/19 11. TRAVEL: "Have you traveled out of the country in the last month?" (e.g., travel history, exposures)  Protocols used: BREATHING DIFFICULTY-A-AH, SINUS PAIN OR CONGESTION-A-AH

## 2020-01-12 NOTE — Progress Notes (Signed)
    Virtual telephone visit    Virtual Visit via Telephone Note   This visit type was conducted due to national recommendations for restrictions regarding the COVID-19 Pandemic (e.g. social distancing) in an effort to limit this patient's exposure and mitigate transmission in our community. Due to her co-morbid illnesses, this patient is at least at moderate risk for complications without adequate follow up. This format is felt to be most appropriate for this patient at this time. The patient did not have access to video technology or had technical difficulties with video requiring transitioning to audio format only (telephone). Physical exam was limited to content and character of the telephone converstion.    Patient location: Home Provider location: Office   wPatient: Cynthia Crawford   DOB: 10-Jul-1991   28 y.o. Female  MRN: [id number] Visit Date: 01/12/2020  Today's healthcare provider: Trey Sailors, PA-C   Chief Complaint  Patient presents with  . URI   Subjective    HPI  She reports nasal congestion since Sunday. She is currently taking zyrtec daily and has done so since the past year. She currently taking ayr nasal spray. She has been vaccinated for COVID and her second 12/12/2019. Reports she is having itchy watery eyes and some PND. Denies SOB, chills, nausea, vomiting. She is breastfeeding currently.       Medications: Outpatient Medications Prior to Visit  Medication Sig  . enalapril (VASOTEC) 10 MG tablet Take 1 tablet (10 mg total) by mouth daily.  . Prenatal Vit-Fe Fumarate-FA (PRENATAL MULTIVITAMIN) TABS tablet Take 1 tablet by mouth at bedtime.   No facility-administered medications prior to visit.    Review of Systems    Objective    There were no vitals taken for this visit.     Assessment & Plan    1. Mild intermittent extrinsic asthma without complication  Fully vaccinated. Signs and symptoms consistent with allergies. Recommend  flonase nasal spray and I have sent this in for her. She may take ibuprofen for pain relief.   2. Allergy, sequela  The entirety of the information documented in the History of Present Illness, Review of Systems and Physical Exam were personally obtained by me. Portions of this information were initially documented by [facility] and reviewed by me for thoroughness and accuracy.   ITrey Sailors, PA-C, have reviewed all documentation for this visit. The documentation on 01/12/20 for the exam, diagnosis, procedures, and orders are all accurate and complete.      No follow-ups on file.    I discussed the assessment and treatment plan with the patient. The patient was provided an opportunity to ask questions and all were answered. The patient agreed with the plan and demonstrated an understanding of the instructions.   The patient was advised to call back or seek an in-person evaluation if the symptoms worsen or if the condition fails to improve as anticipated.  I provided 15 minutes of non-face-to-face time during this encounter.  ITrey Sailors, PA-C, have reviewed all documentation for this visit. The documentation on 01/12/20 for the exam, diagnosis, procedures, and orders are all accurate and complete.   Maryella Shivers [facility] [phone] (phone) [phone] (fax)  [facility]

## 2020-01-13 ENCOUNTER — Other Ambulatory Visit: Payer: Self-pay | Admitting: Family Medicine

## 2020-01-13 ENCOUNTER — Other Ambulatory Visit: Payer: Self-pay | Admitting: Physician Assistant

## 2020-01-13 DIAGNOSIS — T7840XS Allergy, unspecified, sequela: Secondary | ICD-10-CM

## 2020-01-13 DIAGNOSIS — J4520 Mild intermittent asthma, uncomplicated: Secondary | ICD-10-CM

## 2020-01-13 NOTE — Telephone Encounter (Signed)
   Notes to clinic:  Pharmacy comment: Alternative Requested:NOT COVERED   Requested Prescriptions  Pending Prescriptions Disp Refills   mometasone (NASONEX) 50 MCG/ACT nasal spray [Pharmacy Med Name: MOMETASONE FUROATE 50 MCG SPRY] 17 g 0    Sig: Please specify directions, refills and quantity      Ear, Nose, and Throat: Nasal Preparations - Corticosteroids Failed - 01/13/2020  9:15 AM      Failed - Valid encounter within last 12 months    Recent Outpatient Visits           Yesterday Mild intermittent extrinsic asthma without complication   [facility] [place], Adriana M, PA-C   1 month ago Mild intermittent extrinsic asthma without complication   [facility] Osvaldo Angst M, [place]   12 months ago Essential hypertension   [facility] [place], Marzella Schlein, MD   1 year ago Flu-like symptoms   [facility] [place], Marzella Schlein, MD   1 year ago Essential hypertension   [facility] [place], Marzella Schlein, MD

## 2020-01-13 NOTE — Telephone Encounter (Signed)
Pharmacy is requesting changes to Rx written by PCP today

## 2020-01-13 NOTE — Telephone Encounter (Signed)
Ok to send flonase instead

## 2020-02-18 ENCOUNTER — Other Ambulatory Visit: Payer: Self-pay | Admitting: Physician Assistant

## 2020-02-18 DIAGNOSIS — I10 Essential (primary) hypertension: Secondary | ICD-10-CM

## 2020-03-21 IMAGING — US US MFM FETAL BPP W/O NON-STRESS
1 series · 15 of 25 positions shown · non-contrast
Comparison: none

[Series 1: us mfm fetal bpp w/o non-stress · 25 acquisitions, 15 frames shown]
[im 1/25]
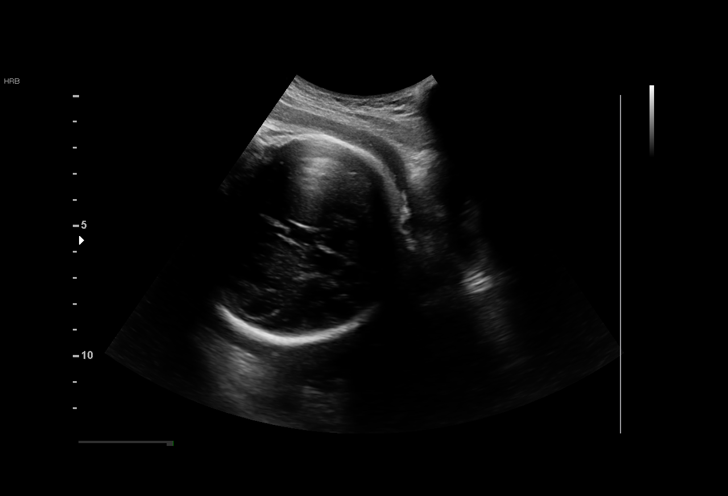
[im 3/25]
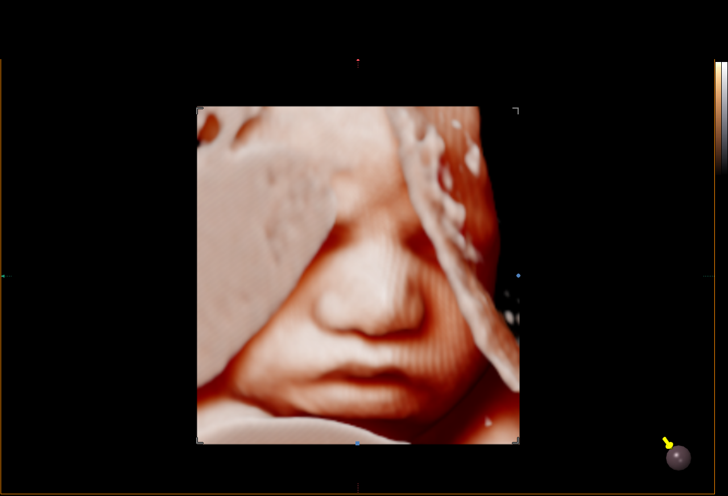
[im 5/25]
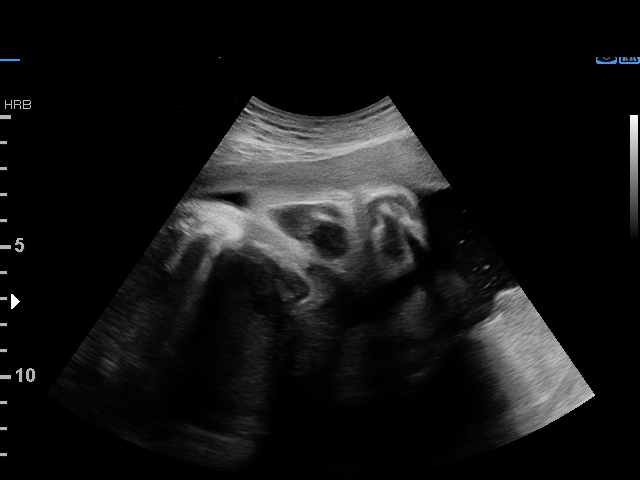
[im 6/25]
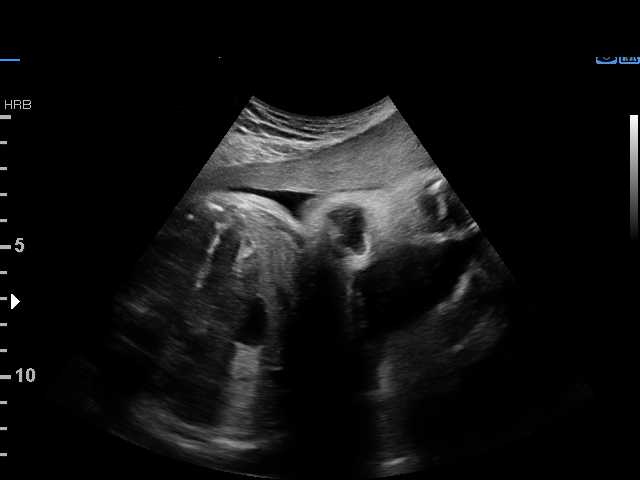
[im 8/25]
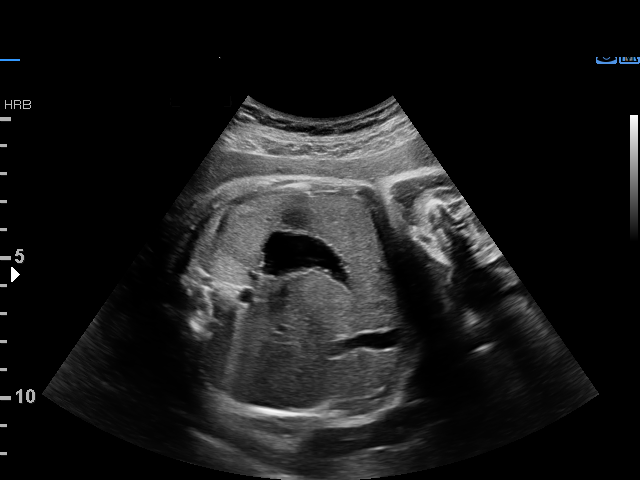
[im 10/25]
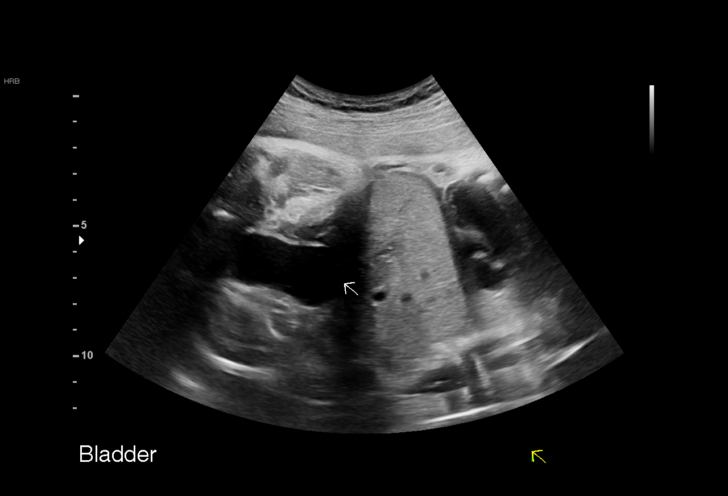
[im 11/25]
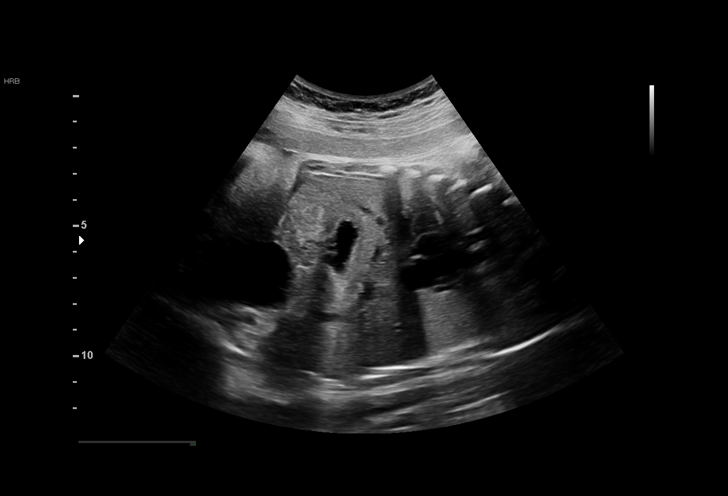
[im 13/25]
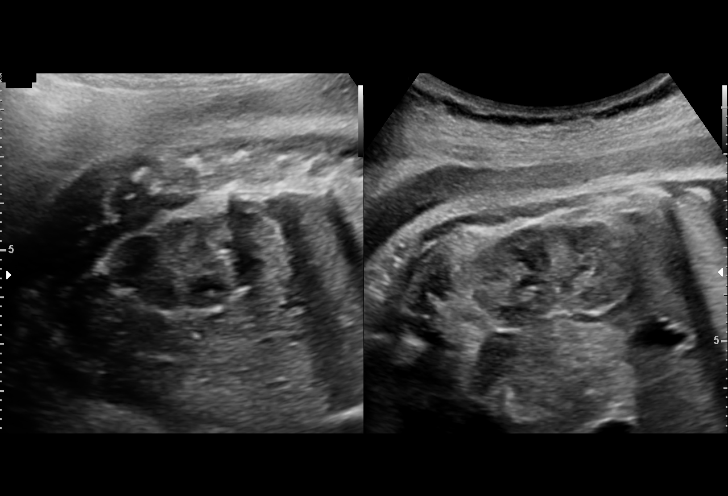
[im 15/25]
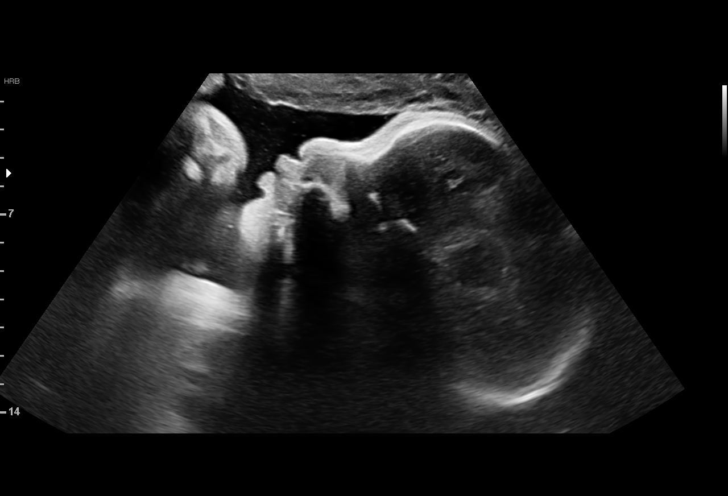
[im 16/25]
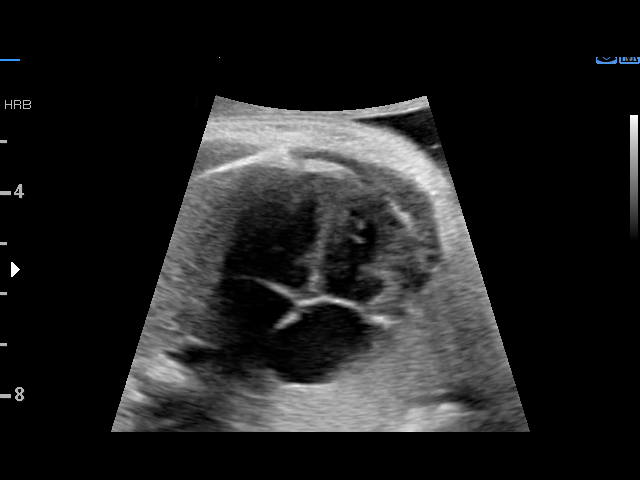
[im 18/25]
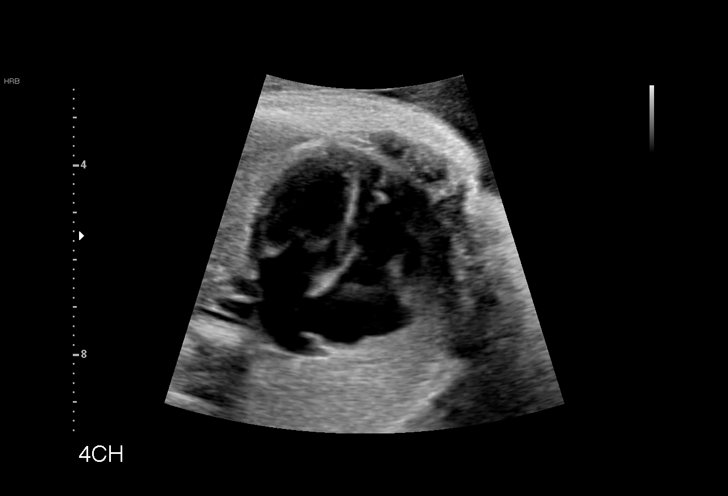
[im 20/25]
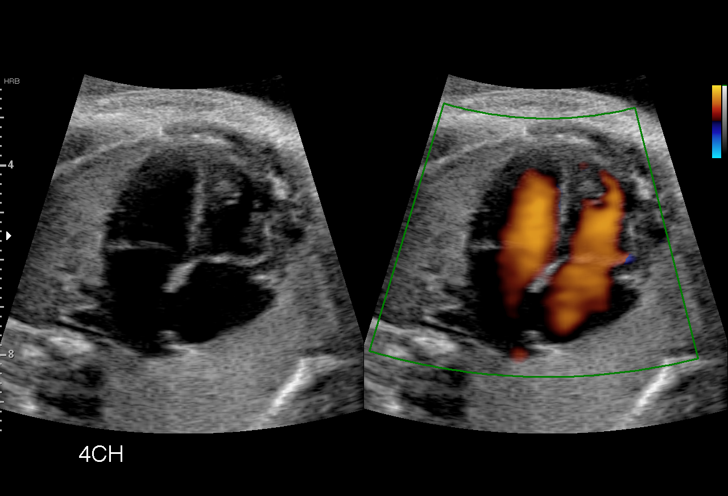
[im 21/25]
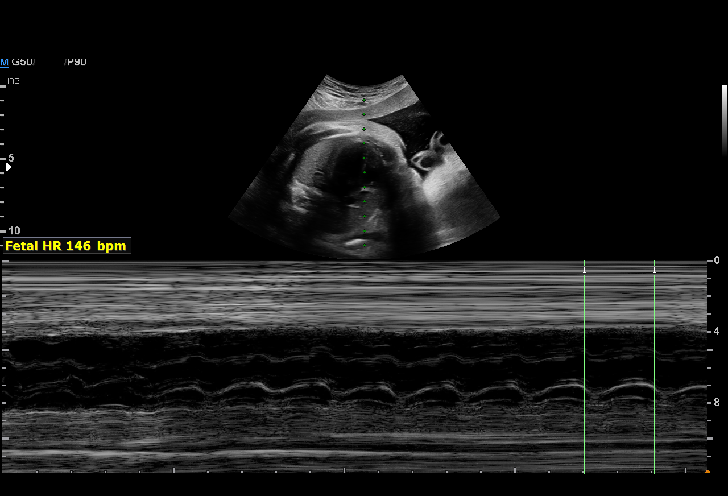
[im 23/25]
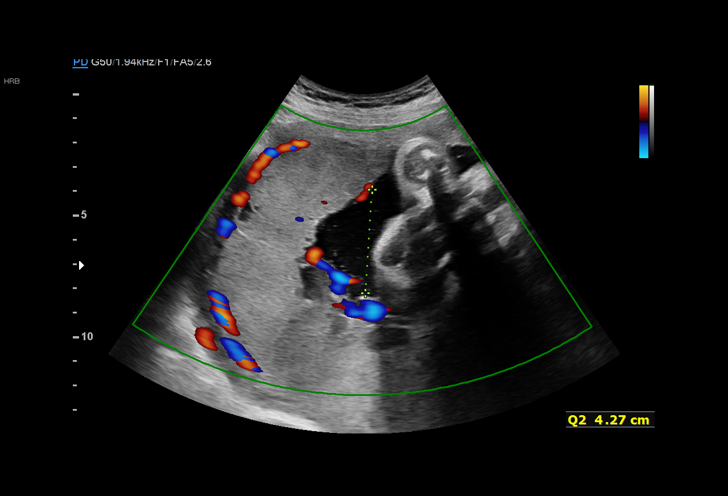
[im 25/25]
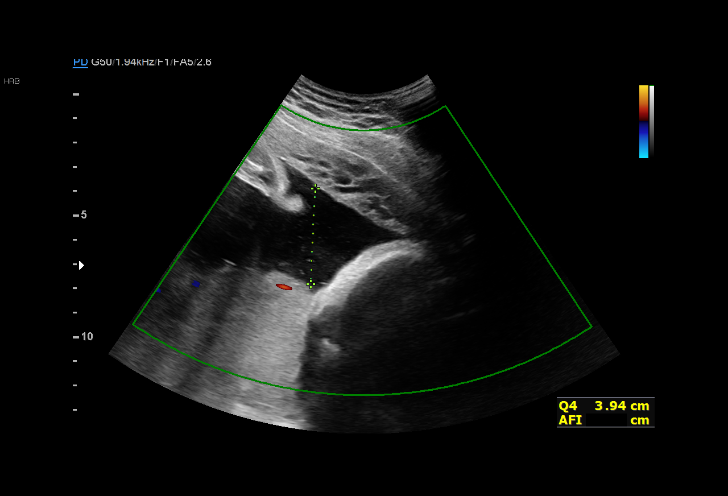

[15 of 25 positions shown; findings below may reference images not displayed]

[place]


 ----------------------------------------------------------------------

 ----------------------------------------------------------------------
Indications

  Hypertension - Chronic/Pre-existing (
  Labetalol & ASA)
  Umbilical vein abnormality complicating
  pregnancy (PRUV)( Negative QUAD)
  35 weeks gestation of pregnancy
 ----------------------------------------------------------------------
Vital Signs

 BMI:
Fetal Evaluation

 Num Of Fetuses:         1
 Fetal Heart Rate(bpm):  146
 Cardiac Activity:       Observed
 Presentation:           Cephalic
 Placenta:               Posterior

 Amniotic Fluid
 AFI FV:      Within normal limits

 AFI Sum(cm)     %Tile       Largest Pocket(cm)
 14.2            51

 RUQ(cm)       RLQ(cm)       LUQ(cm)        LLQ(cm)

Biophysical Evaluation

 Amniotic F.V:   Within normal limits       F. Tone:        Observed
 F. Movement:    Observed                   Score:          [DATE]
 F. Breathing:   Observed
OB History

 Gravidity:    3         Term:   2
 Living:       2
Gestational Age

 LMP:           35w 3d        Date:  01/24/19                 EDD:   10/31/19
 Best:          35w 1d     Det. By:  Previous Ultrasound      EDD:   11/02/19
                                     (03/19/19)
Anatomy

 Thoracic:              Appears normal         Kidneys:                Appear normal
 Stomach:               Appears normal, left   Bladder:                Appears normal
                        sided
Cervix Uterus Adnexa

 Cervix
 Not visualized (advanced GA >31wks)
Impression

 Chronic hypertension. Well-controlled on labetalol.
 Amniotic fluid is normal and good fetal activity is seen.
 Antenatal testing is reassuring. BPP [DATE].

 BP at our office: 132/82 mm Hg.
Recommendations

 -Fetal growth assessment every week.
 -Continue weekly BPP till delivery.
                 Natureza, Leuka

## 2020-03-28 DIAGNOSIS — S83003A Unspecified subluxation of unspecified patella, initial encounter: Secondary | ICD-10-CM | POA: Insufficient documentation

## 2020-03-28 NOTE — Progress Notes (Deleted)
     Established patient visit   Patient: Cynthia Crawford   DOB: 1991-02-13   29 y.o. Female  MRN: [id number] Visit Date: 03/29/2020  Today's healthcare provider: Dortha Kern, PA   No chief complaint on file.  Subjective    HPI  ***  {Show patient history (optional):23778::" "}   Medications: Outpatient Medications Prior to Visit  Medication Sig  . enalapril (VASOTEC) 10 MG tablet TAKE 1 TABLET BY MOUTH EVERY DAY  . flunisolide (NASALIDE) 25 MCG/ACT (0.025%) SOLN Please specify directions, refills and quantity  . Prenatal Vit-Fe Fumarate-FA (PRENATAL MULTIVITAMIN) TABS tablet Take 1 tablet by mouth at bedtime.   No facility-administered medications prior to visit.    Review of Systems  {Heme  Chem  Endocrine  Serology  Results Review (optional):23779::" "}  Objective    There were no vitals taken for this visit. {Show previous vital signs (optional):23777::" "}  Physical Exam  ***  No results found for any visits on 03/29/20.  Assessment & Plan     ***  No follow-ups on file.      {provider attestation***:1}   Dortha Kern, PA  [facility] [phone] (phone) [phone] (fax)  [facility]

## 2020-03-29 ENCOUNTER — Ambulatory Visit: Payer: 59 | Admitting: Family Medicine

## 2020-04-04 IMAGING — US US MFM FETAL BPP W/O NON-STRESS
1 series · 15 of 17 positions shown · non-contrast
Comparison: none

[Series 1: us mfm fetal bpp w/o non-stress · 17 acquisitions, 15 frames shown]
[im 1/17]
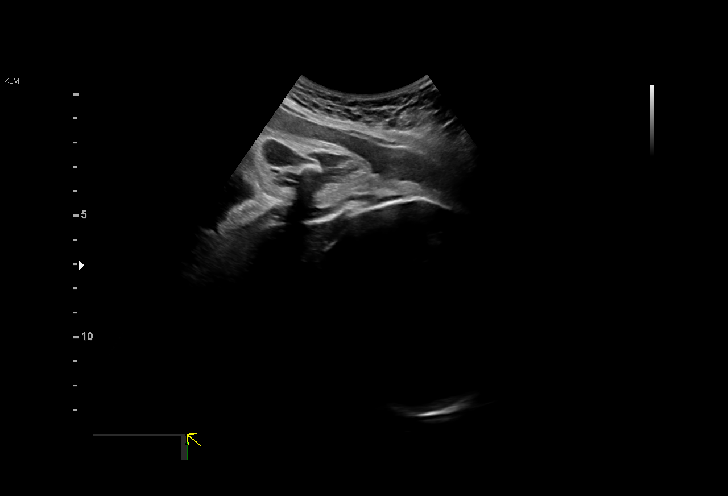
[im 2/17]
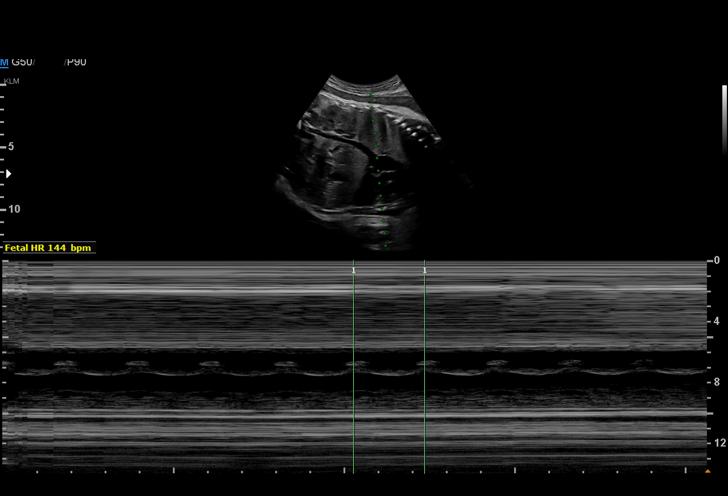
[im 3/17]
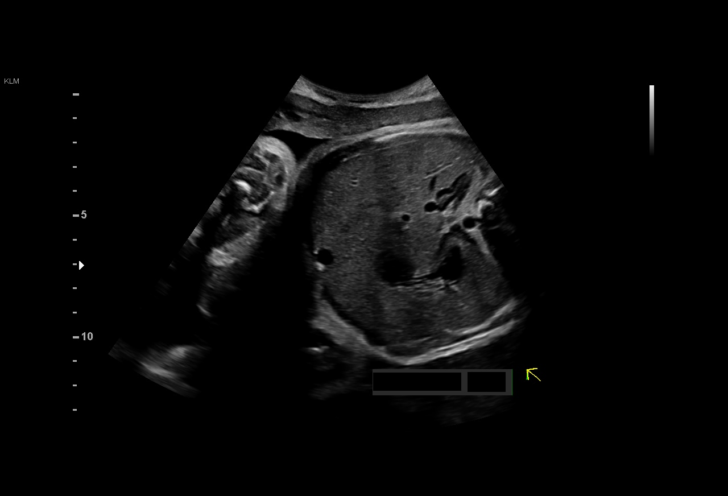
[im 4/17]
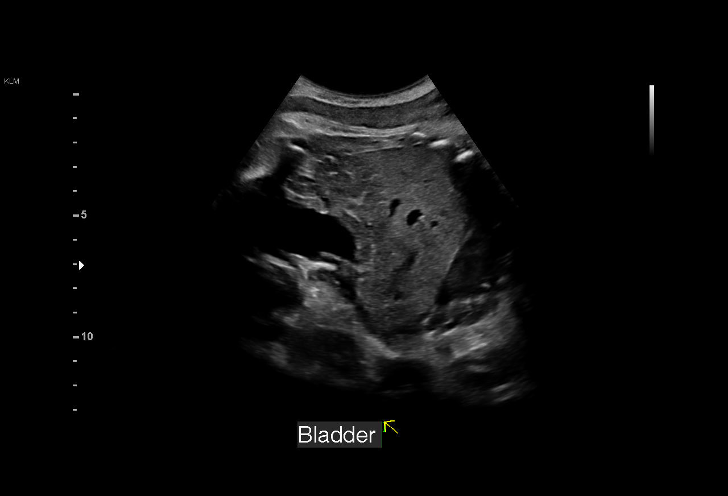
[im 6/17]
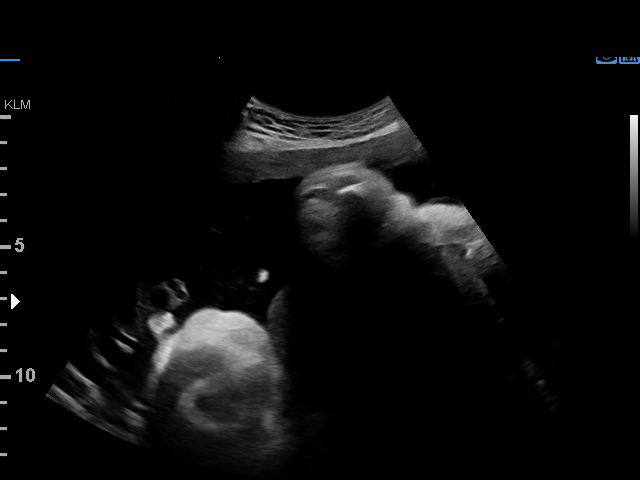
[im 7/17]
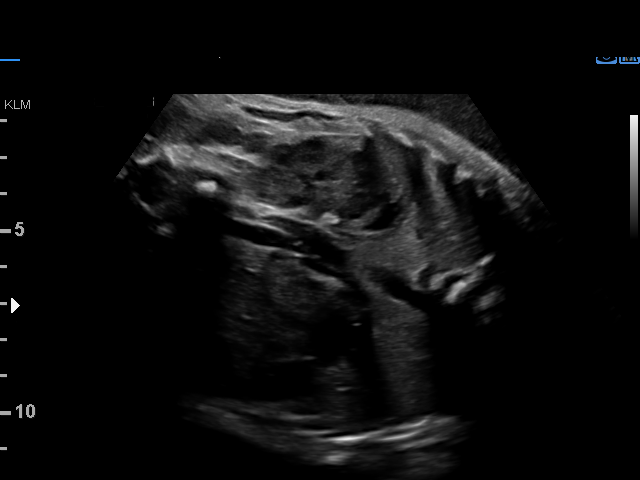
[im 8/17]
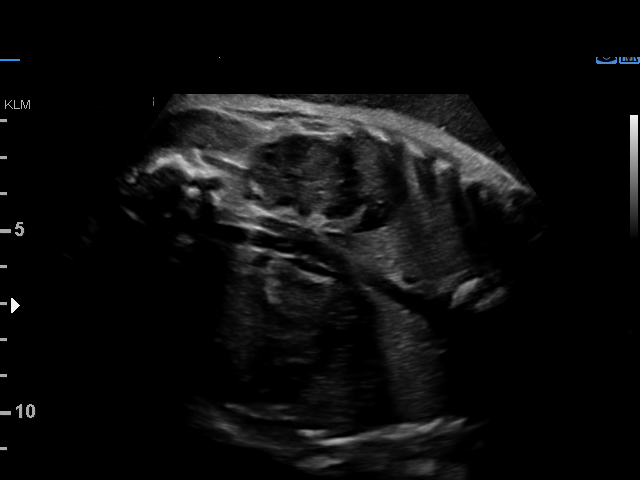
[im 9/17]
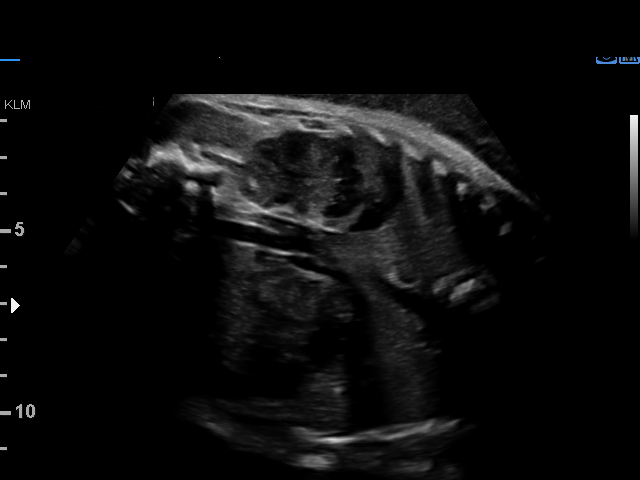
[im 10/17]
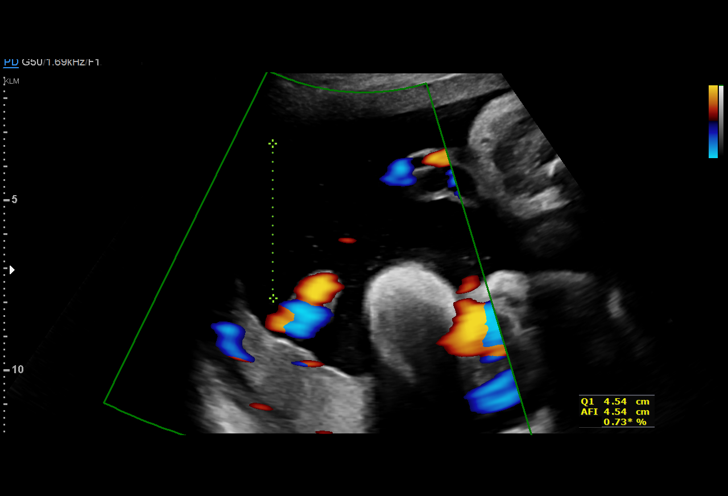
[im 11/17]
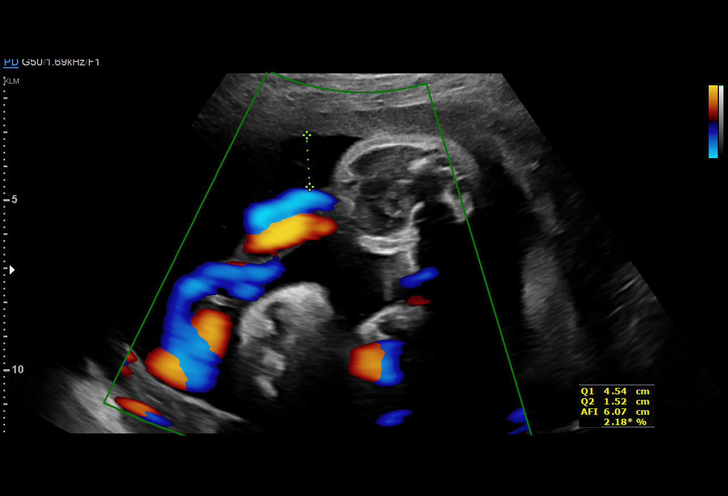
[im 12/17]
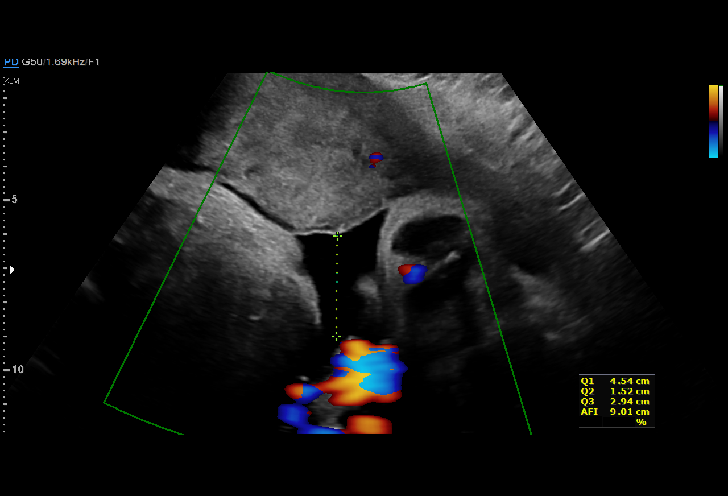
[im 14/17]
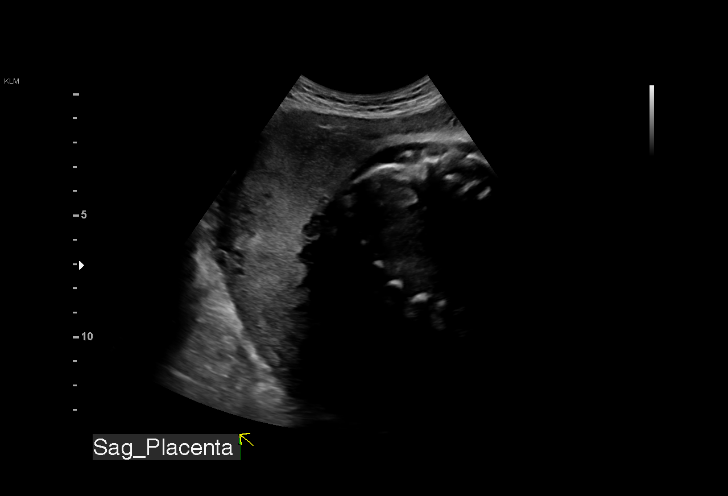
[im 15/17]
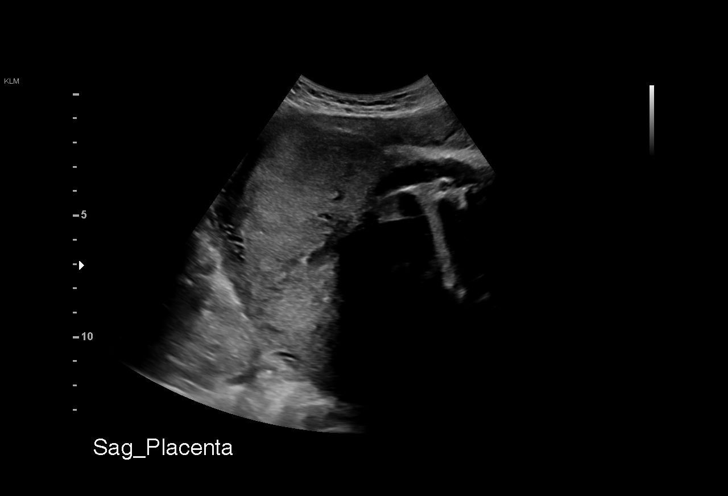
[im 16/17]
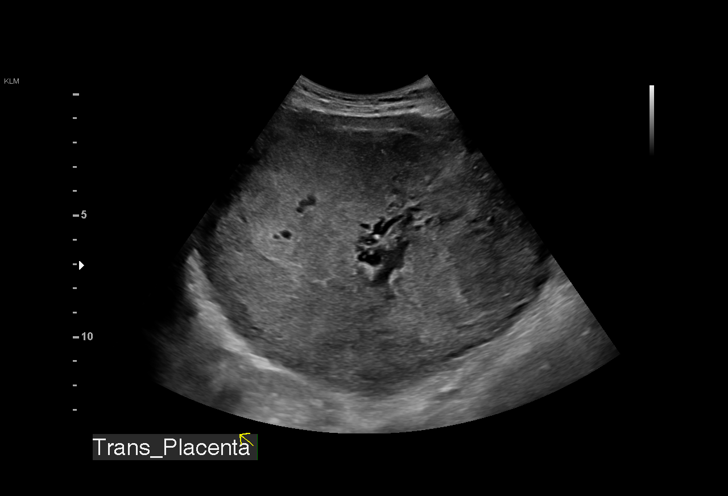
[im 17/17]
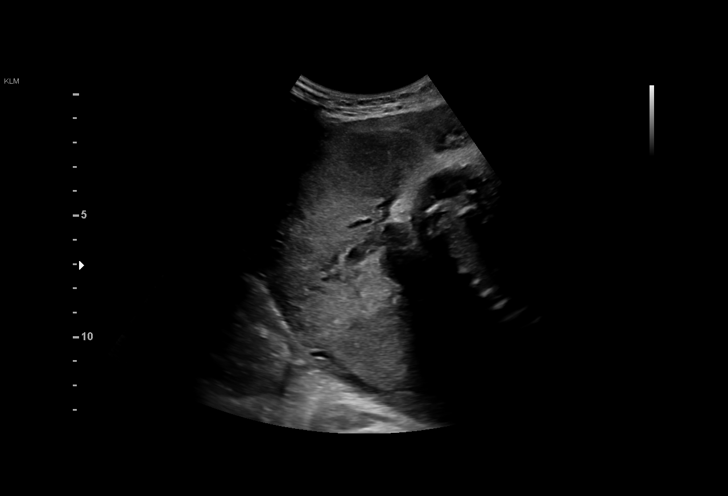

[15 of 17 positions shown; findings below may reference images not displayed]

MITE

 ----------------------------------------------------------------------

 ----------------------------------------------------------------------
Indications

  Hypertension - Chronic/Pre-existing (
  Labetalol & ASA)
  37 weeks gestation of pregnancy
  Umbilical vein abnormality complicating
  pregnancy (PRUV)( Negative QUAD)
 ----------------------------------------------------------------------
Vital Signs

                                                Height:        5'4"
Fetal Evaluation

 Num Of Fetuses:          1
 Fetal Heart Rate(bpm):   144
 Cardiac Activity:        Observed
 Presentation:            Cephalic
 Placenta:                Posterior Left

 Amniotic Fluid
 AFI FV:      Within normal limits

 AFI Sum(cm)     %Tile       Largest Pocket(cm)
 12.93           46

 RUQ(cm)       RLQ(cm)       LUQ(cm)        LLQ(cm)

Biophysical Evaluation

 Amniotic F.V:   Within normal limits       F. Tone:         Observed
 F. Movement:    Observed                   Score:           [DATE]
 F. Breathing:   Observed
OB History

 Gravidity:    3         Term:   2
 Living:       2
Gestational Age

 LMP:           37w 3d        Date:  01/24/19                 EDD:   10/31/19
 Best:          37w 1d     Det. By:  Previous Ultrasound      EDD:   11/02/19
                                     (03/19/19)
Anatomy

 Stomach:               Appears normal, left   Bladder:                Appears normal
                        sided
Impression

 Known chronic hypertension on medication
 Biophysical profile [DATE]
 Persistent right umbilical artery
Recommendations

 Continue weekly testing

## 2020-04-15 ENCOUNTER — Ambulatory Visit: Payer: Self-pay | Admitting: *Deleted

## 2020-04-15 NOTE — Telephone Encounter (Signed)
I returned pt's call.   Her children have been diagnosed with RSV.   She now has a cough for the last 3-4 days with yellow sputum.   Denies fever, diarrhea, sore throat, nasal congestion.   Just the cough.  As I was discussing OTC medications she could use for the cough she mentioned she was breastfeeding.  I instructed her to check with her OB-GYN prior to taking any OTC medications since she is breastfeeding.   She agreed to do this.  "I think I can take Mucinex but I'll double check".   I let her know that was the best thing to do.  I went over the care advice and answered her questions.   Let her know to be sure and call us back if her symptoms become worse or develops any of the symptoms mentioned in the care advice.   She was agreeable and verbalized understanding.   She thanked me for my help.  Reason for Disposition . Care advice for mild cough, questions about  Answer Assessment - Initial Assessment Questions 1. ONSET: "When did the nasal discharge start?"      My kids are sick with RSV.   I started having a cough for 3-4 days.   No other symptoms.  It hurts when I cough. 2. AMOUNT: "How much discharge is there?"      It's yellow sputum. 3. COUGH: "Do you have a cough?" If yes, ask: "Describe the color of your sputum" (clear, white, yellow, green)     Yes for 3-4 days now. 4. RESPIRATORY DISTRESS: "Describe your breathing."      No 5. FEVER: "Do you have a fever?" If Yes, ask: "What is your temperature, how was it measured, and when did it start?"     No 6. SEVERITY: "Overall, how bad are you feeling right now?" (e.g., doesn't interfere with normal activities, staying home from school/work, staying in bed)      ok 7. OTHER SYMPTOMS: "Do you have any other symptoms?" (e.g., sore throat, earache, wheezing, vomiting)     None 8. PREGNANCY: "Is there any chance you are pregnant?" "When was your last menstrual period?"     No  Protocols used: COMMON COLD-A-AH

## 2020-07-22 ENCOUNTER — Other Ambulatory Visit: Payer: Self-pay | Admitting: Physician Assistant

## 2020-07-22 DIAGNOSIS — I10 Essential (primary) hypertension: Secondary | ICD-10-CM

## 2020-08-14 ENCOUNTER — Other Ambulatory Visit: Payer: Self-pay | Admitting: Physician Assistant

## 2020-08-14 DIAGNOSIS — I10 Essential (primary) hypertension: Secondary | ICD-10-CM

## 2020-08-14 NOTE — Telephone Encounter (Signed)
Pt previously given 2 courtesy refills- pt stated she will call this week to schedule appt. Pt is not out of med- refusing prescription and routing to [facility].

## 2020-09-06 ENCOUNTER — Other Ambulatory Visit: Payer: Self-pay | Admitting: Family Medicine

## 2020-09-06 DIAGNOSIS — I10 Essential (primary) hypertension: Secondary | ICD-10-CM

## 2020-09-06 MED ORDER — ENALAPRIL MALEATE 10 MG PO TABS: 10.0000 mg | ORAL_TABLET | Freq: Every day | ORAL | 0 refills | Status: DC

## 2020-09-06 NOTE — Telephone Encounter (Signed)
Requested Prescriptions  Pending Prescriptions Disp Refills  . enalapril (VASOTEC) 10 MG tablet 14 tablet 0    Sig: Take 1 tablet (10 mg total) by mouth daily.     Cardiovascular:  ACE Inhibitors Failed - 09/06/2020  9:16 AM      Failed - Cr in normal range and within 180 days    Creat  Date Value Ref Range Status  01/16/2016 0.62 0.50 - 1.10 mg/dL Final   Creatinine, Ser  Date Value Ref Range Status  11/26/2019 0.80 0.44 - 1.00 mg/dL Final   Creatinine, Urine  Date Value Ref Range Status  10/24/2019 31.20 mg/dL Final         Failed - K in normal range and within 180 days    Potassium  Date Value Ref Range Status  11/26/2019 3.4 (L) 3.5 - 5.1 mmol/L Final         Failed - Valid encounter within last 6 months    Recent Outpatient Visits          7 months ago Mild intermittent extrinsic asthma without complication   [facility] Osvaldo Angst M, PA-C   9 months ago Mild intermittent extrinsic asthma without complication   [facility] [place], Lavella Hammock, PA-C   1 year ago Essential hypertension   [facility] [place], Marzella Schlein, MD   1 year ago Flu-like symptoms   [facility] [place], Marzella Schlein, MD   2 years ago Essential hypertension   [facility] [place], Marzella Schlein, MD      Future Appointments            In 1 week [place], Marzella Schlein, MD [facility]           Passed - Patient is not pregnant      Passed - Last BP in normal range    BP Readings from Last 1 Encounters:  11/24/19 130/85          Courtesy refill #14 given; has appt. 09/19/20

## 2020-09-06 NOTE — Telephone Encounter (Signed)
Copied from CRM [id number]. Topic: Quick Communication - Rx Refill/Question >> Sep 06, 2020  9:10 AM Marylen Ponto wrote: Pt scheduled appt for 09/19/20.  Medication: enalapril (VASOTEC) 10 MG tablet  Has the patient contacted their pharmacy? yes   Preferred Pharmacy (with phone number or street name): [facility] [phone] [address], [place]  Phone:  [phone]  Fax:  [phone]  Agent: Please be advised that RX refills may take up to 3 business days. We ask that you follow-up with your pharmacy.

## 2020-09-08 NOTE — Progress Notes (Signed)
Established patient visit   Patient: Cynthia Crawford   DOB: [birth year]   29 y.o. Female  MRN: [id number] Visit Date: 09/19/2020  Today's healthcare provider: Shirlee Latch, MD   Chief Complaint  Patient presents with  . Hypertension   Subjective    HPI  Hypertension, follow-up  BP Readings from Last 3 Encounters:  09/19/20 (!) 156/100  11/24/19 130/85  11/02/19 138/87   Wt Readings from Last 3 Encounters:  09/19/20 147 lb 6.4 oz (66.9 kg)  11/24/19 168 lb (76.2 kg)  10/24/19 190 lb 4.8 oz (86.3 kg)     She was last seen for hypertension 8 months ago.  BP at that visit was 128/86. Management since that visit includes continue HCTZ.  She was switched to Labetalol during pregnancy and then started on Enalapril postpartum. BPs have been higher since this switch. She is using family planning for contraception and not trying to conceive at this time  She reports excellent compliance with treatment. She is not having side effects.  She is following a Regular diet. She is exercising. She does not smoke.  Use of agents associated with hypertension: none.   Outside blood pressures are stable. Symptoms: No chest pain No chest pressure  No palpitations No syncope  No dyspnea No orthopnea  No paroxysmal nocturnal dyspnea No lower extremity edema   Pertinent labs: Lab Results  Component Value Date   CHOL 158 01/23/2019   HDL 82 01/23/2019   LDLCALC 68 01/23/2019   TRIG 39 01/23/2019   CHOLHDL 1.9 01/23/2019   Lab Results  Component Value Date   NA 141 11/26/2019   K 3.4 (L) 11/26/2019   CREATININE 0.80 11/26/2019   GFRNONAA >60 11/26/2019   GFRAA >60 11/26/2019   GLUCOSE 144 (H) 11/26/2019     The ASCVD Risk score (Goff DC Jr., et al., 2013) failed to calculate for the following reasons:   The 2013 ASCVD risk score is only valid for ages 15 to 33    ---------------------------------------------------------------------------------------------------   Patient Active Problem List   Diagnosis Date Noted  . Allergic asthma 01/16/2019  . Seasonal allergic rhinitis due to pollen 01/16/2019  . History of kidney stones 01/16/2019  . Intrinsic eczema 08/05/2018  . Benign essential hypertension 08/29/2017  . Migraine 09/25/2011   Social History   Tobacco Use  . Smoking status: Never Smoker  . Smokeless tobacco: Never Used  Vaping Use  . Vaping Use: Never used  Substance Use Topics  . Alcohol use: No  . Drug use: No   Allergies  Allergen Reactions  . Penicillins Hives    Has patient had a PCN reaction causing immediate rash, facial/tongue/throat swelling, SOB or lightheadedness with hypotension: Yes Has patient had a PCN reaction causing severe rash involving mucus membranes or skin necrosis: No Has patient had a PCN reaction that required hospitalization No Has patient had a PCN reaction occurring within the last 10 years: No If all of the above answers are "NO", then may proceed with Cephalosporin use.  . Sulfa Antibiotics Hives       Medications: Outpatient Medications Prior to Visit  Medication Sig  . flunisolide (NASALIDE) 25 MCG/ACT (0.025%) SOLN Please specify directions, refills and quantity  . Prenatal Vit-Fe Fumarate-FA (PRENATAL MULTIVITAMIN) TABS tablet Take 1 tablet by mouth at bedtime.  . [DISCONTINUED] enalapril (VASOTEC) 10 MG tablet Take 1 tablet (10 mg total) by mouth daily.   No facility-administered medications prior to visit.  Review of Systems  Constitutional: Negative.   Eyes: Negative.   Respiratory: Negative.   Cardiovascular: Negative.   Neurological: Negative.     Last CBC Lab Results  Component Value Date   WBC 7.4 11/26/2019   HGB 13.5 11/26/2019   HCT 41.5 11/26/2019   MCV 92.2 11/26/2019   MCH 30.0 11/26/2019   RDW 12.3 11/26/2019   PLT 148 (L) 11/26/2019      Objective     BP (!) 156/100 (BP Location: Left Arm, Patient Position: Sitting, Cuff Size: Normal)   Pulse 64   Temp 99 F (37.2 C) (Oral)   Resp 16   Ht 5\' 4"  (1.626 m)   Wt 147 lb 6.4 oz (66.9 kg)   LMP 09/19/2020 (Exact Date)   SpO2 99%   Breastfeeding Yes   BMI 25.30 kg/m  BP Readings from Last 3 Encounters:  09/19/20 (!) 156/100  11/24/19 130/85  11/02/19 138/87   Wt Readings from Last 3 Encounters:  09/19/20 147 lb 6.4 oz (66.9 kg)  11/24/19 168 lb (76.2 kg)  10/24/19 190 lb 4.8 oz (86.3 kg)      Physical Exam Vitals reviewed.  Constitutional:      General: She is not in acute distress.    Appearance: Normal appearance. She is well-developed. She is not diaphoretic.  HENT:     Head: Normocephalic and atraumatic.  Eyes:     General: No scleral icterus.    Conjunctiva/sclera: Conjunctivae normal.  Neck:     Thyroid: No thyromegaly.  Cardiovascular:     Rate and Rhythm: Normal rate and regular rhythm.     Pulses: Normal pulses.     Heart sounds: Normal heart sounds. No murmur heard.   Pulmonary:     Effort: Pulmonary effort is normal. No respiratory distress.     Breath sounds: Normal breath sounds. No wheezing, rhonchi or rales.  Musculoskeletal:     Cervical back: Neck supple.     Right lower leg: No edema.     Left lower leg: No edema.  Lymphadenopathy:     Cervical: No cervical adenopathy.  Skin:    General: Skin is warm and dry.     Findings: No rash.  Neurological:     Mental Status: She is alert and oriented to person, place, and time. Mental status is at baseline.  Psychiatric:        Mood and Affect: Mood normal.        Behavior: Behavior normal.       No results found for any visits on 09/19/20.  Assessment & Plan     Problem List Items Addressed This Visit      Cardiovascular and Mediastinum   Benign essential hypertension - Primary    Uncontrolled today Home readings are in 130s/70s Discussed goal of <130/80 Discussed that neither HCTZ nor  enalapril are safe in pregnancy As she is not currently planning for pregnancy, can resume HCTZ 12.5mg  daily instead of enalapril as she was previously well controlled on this Can call in 2 wks if BP remains elevated and we will switch to 25mg  daily instead Would need to switch back to labetalol for future pregnancy Check BMP      Relevant Medications   hydrochlorothiazide (HYDRODIURIL) 12.5 MG tablet   Other Relevant Orders   Basic Metabolic Panel (BMET)     Respiratory   Allergic asthma    Chronic, mild, well controlled Continue albuterol prn      Seasonal allergic rhinitis  due to pollen    Chronic and well controlled Continue nasal spray prn       Other Visit Diagnoses    Need for immunization against influenza       Relevant Orders   Flu Vaccine QUAD 36+ mos IM (Completed)       Return in about 3 months (around 12/18/2020) for CPE.      I, Shirlee Latch, MD, have reviewed all documentation for this visit. The documentation on 09/19/20 for the exam, diagnosis, procedures, and orders are all accurate and complete.   Cynthia Crawford, Cynthia Schlein, MD, MPH [facility]

## 2020-09-08 NOTE — Patient Instructions (Signed)

## 2020-09-14 ENCOUNTER — Other Ambulatory Visit: Payer: Self-pay | Admitting: Family Medicine

## 2020-09-14 DIAGNOSIS — I10 Essential (primary) hypertension: Secondary | ICD-10-CM

## 2020-09-19 ENCOUNTER — Encounter: Payer: Self-pay | Admitting: Family Medicine

## 2020-09-19 ENCOUNTER — Ambulatory Visit (INDEPENDENT_AMBULATORY_CARE_PROVIDER_SITE_OTHER): Payer: 59 | Admitting: Family Medicine

## 2020-09-19 ENCOUNTER — Other Ambulatory Visit: Payer: Self-pay

## 2020-09-19 VITALS — BP 156/100 | HR 64 | Temp 99.0°F | Resp 16 | Ht 64.0 in | Wt 147.4 lb

## 2020-09-19 DIAGNOSIS — J301 Allergic rhinitis due to pollen: Secondary | ICD-10-CM | POA: Diagnosis not present

## 2020-09-19 DIAGNOSIS — J4520 Mild intermittent asthma, uncomplicated: Secondary | ICD-10-CM

## 2020-09-19 DIAGNOSIS — Z23 Encounter for immunization: Secondary | ICD-10-CM

## 2020-09-19 DIAGNOSIS — I10 Essential (primary) hypertension: Secondary | ICD-10-CM

## 2020-09-19 MED ORDER — HYDROCHLOROTHIAZIDE 12.5 MG PO TABS: 12.5000 mg | ORAL_TABLET | Freq: Every day | ORAL | 3 refills | Status: DC

## 2020-09-19 NOTE — Assessment & Plan Note (Signed)
Chronic, mild, well controlled Continue albuterol prn

## 2020-09-19 NOTE — Assessment & Plan Note (Signed)
Chronic and well controlled Continue nasal spray prn

## 2020-09-19 NOTE — Assessment & Plan Note (Signed)
Uncontrolled today Home readings are in 130s/70s Discussed goal of <130/80 Discussed that neither HCTZ nor enalapril are safe in pregnancy As she is not currently planning for pregnancy, can resume HCTZ 12.5mg  daily instead of enalapril as she was previously well controlled on this Can call in 2 wks if BP remains elevated and we will switch to 25mg  daily instead Would need to switch back to labetalol for future pregnancy Check BMP

## 2020-09-20 LAB — BASIC METABOLIC PANEL
BUN/Creatinine Ratio: 14 (ref 9–23)
BUN: 10 mg/dL (ref 6–20)
CO2: 22 mmol/L (ref 20–29)
Calcium: 9.2 mg/dL (ref 8.7–10.2)
Chloride: 103 mmol/L (ref 96–106)
Creatinine, Ser: 0.69 mg/dL (ref 0.57–1.00)
GFR calc Af Amer: 136 mL/min/{1.73_m2} (ref >59)
GFR calc non Af Amer: 118 mL/min/{1.73_m2} (ref >59)
Glucose: 83 mg/dL (ref 65–99)
Potassium: 4 mmol/L (ref 3.5–5.2)
Sodium: 140 mmol/L (ref 134–144)

## 2020-12-29 ENCOUNTER — Encounter: Payer: Self-pay | Admitting: Family Medicine

## 2021-01-03 ENCOUNTER — Encounter: Payer: Self-pay | Admitting: Adult Health

## 2021-01-27 ENCOUNTER — Encounter: Payer: Self-pay | Admitting: Family Medicine

## 2021-02-03 ENCOUNTER — Other Ambulatory Visit: Payer: Self-pay | Admitting: Family Medicine

## 2021-02-03 NOTE — Telephone Encounter (Signed)
Patient will need office visit for further refills. Requested Prescriptions  Pending Prescriptions Disp Refills  . hydrochlorothiazide (HYDRODIURIL) 12.5 MG tablet [Pharmacy Med Name: HYDROCHLOROTHIAZIDE 12.5 MG TB] 30 tablet 2    Sig: TAKE 1 TABLET BY MOUTH EVERY DAY     Cardiovascular: Diuretics - Thiazide Failed - 02/03/2021  1:34 AM      Failed - Last BP in normal range    BP Readings from Last 1 Encounters:  09/19/20 (!) 156/100         Passed - Ca in normal range and within 360 days    Calcium  Date Value Ref Range Status  09/19/2020 9.2 8.7 - 10.2 mg/dL Final         Passed - Cr in normal range and within 360 days    Creat  Date Value Ref Range Status  01/16/2016 0.62 0.50 - 1.10 mg/dL Final   Creatinine, Ser  Date Value Ref Range Status  09/19/2020 0.69 0.57 - 1.00 mg/dL Final   Creatinine, Urine  Date Value Ref Range Status  10/24/2019 31.20 mg/dL Final         Passed - K in normal range and within 360 days    Potassium  Date Value Ref Range Status  09/19/2020 4.0 3.5 - 5.2 mmol/L Final         Passed - Na in normal range and within 360 days    Sodium  Date Value Ref Range Status  09/19/2020 140 134 - 144 mmol/L Final         Passed - Valid encounter within last 6 months    Recent Outpatient Visits          4 months ago Benign essential hypertension   [facility] [place], Marzella Schlein, MD   1 year ago Mild intermittent extrinsic asthma without complication   [facility] Osvaldo Angst M, PA-C   1 year ago Mild intermittent extrinsic asthma without complication   [facility] Trey Sailors, [place]   2 years ago Essential hypertension   [facility] [place], Marzella Schlein, [place]   2 years ago Flu-like symptoms   [facility] [place], Marzella Schlein, MD

## 2021-02-21 ENCOUNTER — Telehealth: Payer: Self-pay

## 2021-02-21 NOTE — Telephone Encounter (Signed)
Please advise. Thanks.  

## 2021-02-21 NOTE — Telephone Encounter (Signed)
Tried calling patient, no answer. Unable to leave VM due to mailbox being full. Ok to [facility] to advise as below.

## 2021-02-21 NOTE — Telephone Encounter (Signed)
Message from Dr. Sullivan Lone read to pt, verbalizes understanding.

## 2021-02-21 NOTE — Telephone Encounter (Signed)
If this 30 year old patient is vaccinated would recommend Robitussin twice a day in addition to vitamin C vitamin D and zinc daily.  Hydrate aggressively.  Unvaccinated and willing to call in Paxlovid prescription

## 2021-02-21 NOTE — Telephone Encounter (Signed)
Copied from CRM [id number]. Topic: General - Other >> Feb 21, 2021 10:26 AM Cynthia Crawford wrote: Reason for CRM: Pt has covid and was advised to call pcp for medication for covid/ Pts symptoms started yesterday and she is experiencing chills, headache, chest congestion, sore throat /please advise

## 2021-02-21 NOTE — Telephone Encounter (Signed)
Pt stated she is confused why she cannot get a prescription due to the fact her mom had covid and was able to get a prescription. Pt wanted a call back as soon as possible.

## 2021-02-22 MED ORDER — NIRMATRELVIR/RITONAVIR (PAXLOVID)TABLET: 3.0000 | ORAL_TABLET | Freq: Two times a day (BID) | ORAL | 0 refills | Status: AC

## 2021-02-22 NOTE — Addendum Note (Signed)
Addended by: [place] on: 02/22/2021 02:22 PM   Modules accepted: Orders

## 2021-02-22 NOTE — Telephone Encounter (Signed)
Sent in.  advised to check with pharmacy to see if it is safe to breast feed while on medication.

## 2021-02-22 NOTE — Telephone Encounter (Signed)
Ok to send in regular dose Paxlovid.

## 2021-02-22 NOTE — Telephone Encounter (Signed)
Please advise. Patient has been vaccinated with Moderna series. No booster.

## 2021-03-24 ENCOUNTER — Ambulatory Visit
Admission: RE | Admit: 2021-03-24 | Discharge: 2021-03-24 | Disposition: A | Discharge status: Home / Self Care | Payer: BC Managed Care – PPO | Source: Ambulatory Visit

## 2021-03-24 ENCOUNTER — Other Ambulatory Visit: Payer: Self-pay

## 2021-03-24 VITALS — BP 128/83 | HR 67 | Temp 99.4°F | Resp 13

## 2021-03-24 DIAGNOSIS — H6121 Impacted cerumen, right ear: Secondary | ICD-10-CM

## 2021-03-24 NOTE — Discharge Instructions (Addendum)
Follow up with your primary care provider if your symptoms are not improving.     

## 2021-03-24 NOTE — ED Provider Notes (Signed)
[name]    CSN: [id number] Arrival date & time: 03/24/21  1029      History   Chief Complaint Chief Complaint  Patient presents with   Otalgia   Lymphadenopathy   APPT 1030    HPI Cynthia Crawford is a 30 y.o. female.  Patient presents with 2-day history of right ear pain.  She also notes lymph node swelling.  She denies fever, chills, sore throat, cough, shortness of breath, or other symptoms.  Treatment attempted at home with Tylenol.  Her medical history includes asthma, eczema, seasonal allergies, hypertension, migraine headaches, kidney stones.  The history is provided by the patient and medical records.   Past Medical History:  Diagnosis Date   Asthma    Chronic hypertension during pregnancy, antepartum 08/29/2017   [x]  Aspirin 81 mg daily after 12 weeks; discontinue after 36 weeks Current antihypertensives:  None  Was on Enalapril prior to pregnancy  Baseline and surveillance labs (pulled in from [facility], refresh links as needed)  Lab Results Component Value Date  PLT 213 10/16/2017  CREATININE 0.61 10/16/2017  AST 17 10/16/2017  ALT 13 10/16/2017  PROTCRRATIO 0.21 (H) 01/17/2016  PROTEIN24HR 242 (H) 01/16/2016   Contact dermatitis due to soap 11/20/2017   History of gestational hypertension    Kidney stones     Patient Active Problem List   Diagnosis Date Noted   Allergic asthma 01/16/2019   Seasonal allergic rhinitis due to pollen 01/16/2019   History of kidney stones 01/16/2019   Intrinsic eczema 08/05/2018   Benign essential hypertension 08/29/2017   Migraine 09/25/2011    Past Surgical History:  Procedure Laterality Date   TONSILLECTOMY      OB History     Gravida  3   Para  3   Term  3   Preterm  0   AB  0   Living  3      SAB  0   IAB  0   Ectopic  0   Multiple  0   Live Births  3            Home Medications    Prior to Admission medications   Medication Sig Start Date End Date Taking? Authorizing  Provider  cetirizine (ZYRTEC) 10 MG tablet Take 10 mg by mouth daily.   Yes [provider]  hydrochlorothiazide (HYDRODIURIL) 12.5 MG tablet TAKE 1 TABLET BY MOUTH EVERY DAY 02/03/21  Yes Bacigalupo, 02/05/21, MD  flunisolide (NASALIDE) 25 MCG/ACT (0.025%) SOLN Please specify directions, refills and quantity 01/14/20   Bacigalupo, 01/16/20, MD  Prenatal Vit-Fe Fumarate-FA (PRENATAL MULTIVITAMIN) TABS tablet Take 1 tablet by mouth at bedtime.    [provider]    Family History Family History  Problem Relation Age of Onset   Diabetes Father    Asthma Mother     Social History Social History   Tobacco Use   Smoking status: Never   Smokeless tobacco: Never  Vaping Use   Vaping Use: Never used  Substance Use Topics   Alcohol use: No   Drug use: No     Allergies   Penicillins and Sulfa antibiotics   Review of Systems Review of Systems  Constitutional:  Negative for chills and fever.  HENT:  Positive for ear pain. Negative for ear discharge, hearing loss and sore throat.   Respiratory:  Negative for cough and shortness of breath.   Cardiovascular:  Negative for chest pain and palpitations.  Gastrointestinal:  Negative for abdominal pain and vomiting.  Skin:  Negative for color change and rash.  All other systems reviewed and are negative.   Physical Exam Triage Vital Signs ED Triage Vitals [03/24/21 1032]  Enc Vitals Group     BP      Pulse      Resp      Temp      Temp src      SpO2      Weight      Height      Head Circumference      Peak Flow      Pain Score 5     Pain Loc      Pain Edu?      Excl. in GC?    No data found.  Updated Vital Signs BP 128/83 (BP Location: Left Arm)   Pulse 67   Temp 99.4 F (37.4 C) (Oral)   Resp 13   LMP 03/24/2021 (Exact Date)   SpO2 99%   Visual Acuity Right Eye Distance:   Left Eye Distance:   Bilateral Distance:    Right Eye Near:   Left Eye Near:    Bilateral Near:     Physical  Exam Vitals and nursing note reviewed.  Constitutional:      General: She is not in acute distress.    Appearance: She is well-developed. She is not ill-appearing.  HENT:     Head: Normocephalic and atraumatic.     Right Ear: There is impacted cerumen.     Left Ear: Tympanic membrane and ear canal normal.     Nose: Nose normal.     Mouth/Throat:     Mouth: Mucous membranes are moist.     Pharynx: Oropharynx is clear.  Eyes:     Conjunctiva/sclera: Conjunctivae normal.  Cardiovascular:     Rate and Rhythm: Normal rate and regular rhythm.     Heart sounds: No murmur heard. Pulmonary:     Effort: Pulmonary effort is normal. No respiratory distress.     Breath sounds: Normal breath sounds.  Abdominal:     Palpations: Abdomen is soft.     Tenderness: There is no abdominal tenderness.  Musculoskeletal:     Cervical back: Normal range of motion and neck supple. No rigidity.  Lymphadenopathy:     Cervical: No cervical adenopathy.  Skin:    General: Skin is warm and dry.  Neurological:     General: No focal deficit present.     Mental Status: She is alert and oriented to person, place, and time.     Gait: Gait normal.  Psychiatric:        Mood and Affect: Mood normal.        Behavior: Behavior normal.     UC Treatments / Results  Labs (all labs ordered are listed, but only abnormal results are displayed) Labs Reviewed - No data to display  EKG   Radiology No results found.  Procedures Procedures (including critical care time)  Medications Ordered in UC Medications - No data to display  Initial Impression / Assessment and Plan / UC Course  I have reviewed the triage vital signs and the nursing notes.  Pertinent labs & imaging results that were available during my care of the patient were reviewed by me and considered in my medical decision making (see chart for details).   Right cerumen impaction.  Cerumen removed with irrigation by RN.  TM visualized afterwards  and is clear.  Patient  reports complete relief of her symptoms.  Education provided on earwax buildup and instructed patient to follow-up with her PCP as needed.  She agrees to plan of care.   Final Clinical Impressions(s) / UC Diagnoses   Final diagnoses:  Impacted cerumen of right ear     Discharge Instructions      Follow up with your primary care provider if your symptoms are not improving.         ED Prescriptions   None    PDMP not reviewed this encounter.   Mickie Bail, NP 03/24/21 1052

## 2021-03-24 NOTE — ED Triage Notes (Signed)
Patient c/o RT sided ear pain and RT sided submandibular lymph node swelling x 2 days.   Patient denies fever,nasal congestion, sinus pressure, and headache.   Patient denies hearing changes. Patient states "it pops at times but no changes with hearing".   Patient has used Tylenol with no relief of symptoms.

## 2021-06-03 ENCOUNTER — Other Ambulatory Visit: Payer: Self-pay | Admitting: Family Medicine

## 2021-06-04 NOTE — Telephone Encounter (Signed)
Pt due for OV. Called pt and LM on VM to call back. Call back number provided. Refilled with 30 day courtesy RF  02/03/21 #30  2 RF

## 2021-06-14 ENCOUNTER — Ambulatory Visit: Payer: BC Managed Care – PPO | Admitting: Advanced Practice Midwife

## 2021-06-18 ENCOUNTER — Other Ambulatory Visit: Payer: Self-pay | Admitting: Family Medicine

## 2021-07-04 ENCOUNTER — Ambulatory Visit (INDEPENDENT_AMBULATORY_CARE_PROVIDER_SITE_OTHER): Payer: BC Managed Care – PPO | Admitting: Obstetrics & Gynecology

## 2021-07-04 ENCOUNTER — Other Ambulatory Visit: Payer: Self-pay

## 2021-07-04 ENCOUNTER — Encounter: Payer: Self-pay | Admitting: Obstetrics & Gynecology

## 2021-07-04 ENCOUNTER — Other Ambulatory Visit (HOSPITAL_COMMUNITY)
Admission: RE | Admit: 2021-07-04 | Discharge: 2021-07-04 | Disposition: A | Discharge status: Home / Self Care | Payer: BC Managed Care – PPO | Source: Ambulatory Visit | Attending: Advanced Practice Midwife | Admitting: Advanced Practice Midwife

## 2021-07-04 VITALS — BP 138/89 | HR 69 | Ht 63.0 in | Wt 152.0 lb

## 2021-07-04 DIAGNOSIS — Z01419 Encounter for gynecological examination (general) (routine) without abnormal findings: Secondary | ICD-10-CM

## 2021-07-04 DIAGNOSIS — Z113 Encounter for screening for infections with a predominantly sexual mode of transmission: Secondary | ICD-10-CM | POA: Insufficient documentation

## 2021-07-04 NOTE — Progress Notes (Signed)
GYNECOLOGY ANNUAL PREVENTATIVE CARE ENCOUNTER NOTE  History:     Cynthia Crawford is a 30 y.o. [id number] female here for a routine annual gynecologic exam.  Current complaints: feels tried all the time, but is nursing a 90 month old and stopped taking multivitamins.   Denies abnormal vaginal bleeding, discharge, pelvic pain, problems with intercourse or other gynecologic concerns.    Gynecologic History Patient's last menstrual period was 06/13/2021. Contraception: none, declined. Last Pap: 10/23/2017. Result was normal   Obstetric History OB History  Gravida Para Term Preterm AB Living  3 3 3  0 0 3  SAB IAB Ectopic Multiple Live Births  0 0 0 0 3    # Outcome Date GA Lbr Len/2nd Weight Sex Delivery Anes PTL Lv  3 Term 10/24/19 [redacted]w[redacted]d 05:30 / 00:36 6 lb 13.7 oz (3.11 kg) M Vag-Spont EPI  LIV  2 Term 05/17/18 [redacted]w[redacted]d 12:33 / 00:51 7 lb 0.5 oz (3.19 kg) M Vag-Spont EPI  LIV  1 Term 01/18/16 [redacted]w[redacted]d / 03:20 5 lb 13.5 oz (2.651 kg) F Vag-Spont EPI, Local  LIV    Past Medical History:  Diagnosis Date   Asthma    Contact dermatitis due to soap 11/20/2017   History of gestational hypertension    Hypertension 08/29/2017   Kidney stones     Past Surgical History:  Procedure Laterality Date   TONSILLECTOMY      Current Outpatient Medications on File Prior to Visit  Medication Sig Dispense Refill   cetirizine (ZYRTEC) 10 MG tablet Take 10 mg by mouth daily.     cholecalciferol (VITAMIN D3) 25 MCG (1000 UNIT) tablet Take 1,000 Units by mouth daily.     hydrochlorothiazide (HYDRODIURIL) 12.5 MG tablet TAKE 1 TABLET BY MOUTH EVERY DAY 90 tablet 0   Multiple Vitamin (MULTIVITAMIN) tablet Take by mouth daily.     No current facility-administered medications on file prior to visit.    Allergies  Allergen Reactions   Penicillins Hives    Has patient had a PCN reaction causing immediate rash, facial/tongue/throat swelling, SOB or lightheadedness with hypotension: Yes Has patient  had a PCN reaction causing severe rash involving mucus membranes or skin necrosis: No Has patient had a PCN reaction that required hospitalization No Has patient had a PCN reaction occurring within the last 10 years: No If all of the above answers are "NO", then may proceed with Cephalosporin use.   Sulfa Antibiotics Hives    Social History:  reports that she has never smoked. She has never used smokeless tobacco. She reports that she does not drink alcohol and does not use drugs.  Family History  Problem Relation Age of Onset   Diabetes Father    Asthma Mother     The following portions of the patient's history were reviewed and updated as appropriate: allergies, current medications, past family history, past medical history, past social history, past surgical history and problem list.  Review of Systems Pertinent items noted in HPI and remainder of comprehensive ROS otherwise negative.  Physical Exam:  BP 138/89   Pulse 69   Ht 5\' 3"  (1.6 m)   Wt 152 lb (68.9 kg)   LMP 06/13/2021   BMI 26.93 kg/m  CONSTITUTIONAL: Well-developed, well-nourished female in no acute distress.  HENT:  Normocephalic, atraumatic, External right and left ear normal.  EYES: Conjunctivae and EOM are normal. Pupils are equal, round, and reactive to light. No scleral icterus.  NECK: Normal range of motion, supple, no  masses.  Normal thyroid.  SKIN: Skin is warm and dry. No rash noted. Not diaphoretic. No erythema. No pallor. MUSCULOSKELETAL: Normal range of motion. No tenderness.  No cyanosis, clubbing, or edema. NEUROLOGIC: Alert and oriented to person, place, and time. Normal reflexes, muscle tone coordination.  PSYCHIATRIC: Normal mood and affect. Normal behavior. Normal judgment and thought content. CARDIOVASCULAR: Normal heart rate noted, regular rhythm RESPIRATORY: Clear to auscultation bilaterally. Effort and breath sounds normal, no problems with respiration noted. BREASTS: Symmetric in size. No  masses, tenderness, skin changes, nipple drainage, or lymphadenopathy bilaterally. Performed in the presence of a chaperone. ABDOMEN: Soft, no distention noted.  No tenderness, rebound or guarding.  PELVIC: Normal appearing external genitalia and urethral meatus; normal appearing vaginal mucosa and cervix.  No abnormal vaginal discharge noted.  Pap smear obtained.  Normal uterine size, no other palpable masses, no uterine or adnexal tenderness.  Performed in the presence of a chaperone.   Assessment and Plan:     1. Well woman exam with routine gynecological exam - Cytology - PAP Routine preventative health maintenance measures emphasized. Please refer to After Visit Summary for other counseling recommendations.      Jaynie Collins, MD, FACOG Obstetrician & Gynecologist, [facility] for [employer], [facility]

## 2021-07-04 NOTE — Patient Instructions (Signed)
Preventive Care 21-30 Years Old, Female Preventive care refers to lifestyle choices and visits with your health care provider that can promote health and wellness. This includes: A yearly physical exam. This is also called an annual wellness visit. Regular dental and eye exams. Immunizations. Screening for certain conditions. Healthy lifestyle choices, such as: Eating a healthy diet. Getting regular exercise. Not using drugs or products that contain nicotine and tobacco. Limiting alcohol use. What can I expect for my preventive care visit? Physical exam Your health care provider may check your: Height and weight. These may be used to calculate your BMI (body mass index). BMI is a measurement that tells if you are at a healthy weight. Heart rate and blood pressure. Body temperature. Skin for abnormal spots. Counseling Your health care provider may ask you questions about your: Past medical problems. Family's medical history. Alcohol, tobacco, and drug use. Emotional well-being. Home life and relationship well-being. Sexual activity. Diet, exercise, and sleep habits. Work and work environment. Access to firearms. Method of birth control. Menstrual cycle. Pregnancy history. What immunizations do I need? Vaccines are usually given at various ages, according to a schedule. Your health care provider will recommend vaccines for you based on your age, medical history, and lifestyle or other factors, such as travel or where you work. What tests do I need? Blood tests Lipid and cholesterol levels. These may be checked every 5 years starting at age 20. Hepatitis C test. Hepatitis B test. Screening Diabetes screening. This is done by checking your blood sugar (glucose) after you have not eaten for a while (fasting). STD (sexually transmitted disease) testing, if you are at risk. BRCA-related cancer screening. This may be done if you have a family history of breast, ovarian, tubal, or  peritoneal cancers. Pelvic exam and Pap test. This may be done every 3 years starting at age 21. Starting at age 30, this may be done every 5 years if you have a Pap test in combination with an HPV test. Talk with your health care provider about your test results, treatment options, and if necessary, the need for more tests. Follow these instructions at home: Eating and drinking  Eat a healthy diet that includes fresh fruits and vegetables, whole grains, lean protein, and low-fat dairy products. Take vitamin and mineral supplements as recommended by your health care provider. Do not drink alcohol if: Your health care provider tells you not to drink. You are pregnant, may be pregnant, or are planning to become pregnant. If you drink alcohol: Limit how much you have to 0-1 drink a day. Be aware of how much alcohol is in your drink. In the [place] one drink equals one 12 oz bottle of beer (355 mL), one 5 oz glass of wine (148 mL), or one 1 oz glass of hard liquor (44 mL). Lifestyle Take daily care of your teeth and gums. Brush your teeth every morning and night with fluoride toothpaste. Floss one time each day. Stay active. Exercise for at least 30 minutes 5 or more days each week. Do not use any products that contain nicotine or tobacco, such as cigarettes, e-cigarettes, and chewing tobacco. If you need help quitting, ask your health care provider. Do not use drugs. If you are sexually active, practice safe sex. Use a condom or other form of protection to prevent STIs (sexually transmitted infections). If you do not wish to become pregnant, use a form of birth control. If you plan to become pregnant, see your health care provider   for a prepregnancy visit. Find healthy ways to cope with stress, such as: Meditation, yoga, or listening to music. Journaling. Talking to a trusted person. Spending time with friends and family. Safety Always wear your seat belt while driving or riding in a  vehicle. Do not drive: If you have been drinking alcohol. Do not ride with someone who has been drinking. When you are tired or distracted. While texting. Wear a helmet and other protective equipment during sports activities. If you have firearms in your house, make sure you follow all gun safety procedures. Seek help if you have been physically or sexually abused. What's next? Go to your health care provider once a year for an annual wellness visit. Ask your health care provider how often you should have your eyes and teeth checked. Stay up to date on all vaccines. This information is not intended to replace advice given to you by your health care provider. Make sure you discuss any questions you have with your health care provider. Document Revised: 11/11/2020 Document Reviewed: 05/15/2018 Elsevier Patient Education  2022 Elsevier Inc.  

## 2021-07-05 LAB — CYTOLOGY - PAP
Chlamydia: NEGATIVE
Comment: NEGATIVE
Comment: NEGATIVE
Comment: NEGATIVE
Comment: NORMAL
Diagnosis: NEGATIVE
High risk HPV: NEGATIVE
Neisseria Gonorrhea: NEGATIVE
Trichomonas: NEGATIVE

## 2021-07-06 ENCOUNTER — Ambulatory Visit
Admission: RE | Admit: 2021-07-06 | Discharge: 2021-07-06 | Disposition: A | Discharge status: Home / Self Care | Payer: BC Managed Care – PPO | Source: Ambulatory Visit | Attending: Internal Medicine | Admitting: Internal Medicine

## 2021-07-06 ENCOUNTER — Other Ambulatory Visit: Payer: Self-pay

## 2021-07-06 VITALS — BP 125/86 | HR 72 | Temp 98.0°F | Resp 18

## 2021-07-06 DIAGNOSIS — K209 Esophagitis, unspecified without bleeding: Secondary | ICD-10-CM | POA: Diagnosis not present

## 2021-07-06 DIAGNOSIS — R079 Chest pain, unspecified: Secondary | ICD-10-CM | POA: Diagnosis not present

## 2021-07-06 MED ORDER — ALUM & MAG HYDROXIDE-SIMETH 200-200-20 MG/5ML PO SUSP
30.0000 mL | Freq: Once | ORAL | Status: AC
  Administered 2021-07-06: 30 mL via ORAL

## 2021-07-06 MED ORDER — OMEPRAZOLE 20 MG PO CPDR: 20.0000 mg | DELAYED_RELEASE_CAPSULE | Freq: Every day | ORAL | 0 refills | Status: DC

## 2021-07-06 MED ORDER — LIDOCAINE VISCOUS HCL 2 % MT SOLN
15.0000 mL | Freq: Once | OROMUCOSAL | Status: AC
  Administered 2021-07-06: 15 mL via ORAL

## 2021-07-06 NOTE — Discharge Instructions (Addendum)
Follow up with your primary care doctor in 2 week

## 2021-07-06 NOTE — ED Provider Notes (Signed)
Cynthia Crawford    CSN: [id number] Arrival date & time: 07/06/21  1615      History   Chief Complaint Chief Complaint  Patient presents with   Chest Pain    HPI Cynthia Crawford is a 30 y.o. female who presents with substernal chest pain that is dull ache and constant since yesterday. Denies SOB or palpitations.  She was just running around during her regular day activities with gradual onset. Nothing makes it worse, thumbs helped a little bit.  Denies symptoms of GERD, used to when pregnant  Denies black stools or blood in stools.      Past Medical History:  Diagnosis Date   Asthma    Contact dermatitis due to soap 11/20/2017   History of gestational hypertension    Hypertension 08/29/2017   Kidney stones     Patient Active Problem List   Diagnosis Date Noted   Allergic asthma 01/16/2019   Seasonal allergic rhinitis due to pollen 01/16/2019   History of kidney stones 01/16/2019   Intrinsic eczema 08/05/2018   Benign essential hypertension 08/29/2017   Migraine 09/25/2011    Past Surgical History:  Procedure Laterality Date   TONSILLECTOMY      OB History     Gravida  3   Para  3   Term  3   Preterm  0   AB  0   Living  3      SAB  0   IAB  0   Ectopic  0   Multiple  0   Live Births  3            Home Medications    Prior to Admission medications   Medication Sig Start Date End Date Taking? Authorizing Provider  omeprazole (PRILOSEC) 20 MG capsule Take 1 capsule (20 mg total) by mouth daily. 07/06/21  Yes Rodriguez-Southworth, Nettie Elm, PA-C  cetirizine (ZYRTEC) 10 MG tablet Take 10 mg by mouth daily.    [provider]  cholecalciferol (VITAMIN D3) 25 MCG (1000 UNIT) tablet Take 1,000 Units by mouth daily.    [provider]  hydrochlorothiazide (HYDRODIURIL) 12.5 MG tablet TAKE 1 TABLET BY MOUTH EVERY DAY 06/18/21   Erasmo Downer, MD  Multiple Vitamin (MULTIVITAMIN) tablet Take by mouth  daily.    [provider]    Family History Family History  Problem Relation Age of Onset   Diabetes Father    Asthma Mother     Social History Social History   Tobacco Use   Smoking status: Never   Smokeless tobacco: Never  Vaping Use   Vaping Use: Never used  Substance Use Topics   Alcohol use: No   Drug use: No     Allergies   Penicillins and Sulfa antibiotics   Review of Systems Review of Systems  Constitutional:  Negative for diaphoresis.  Respiratory:  Negative for cough and chest tightness.   Cardiovascular:  Positive for chest pain.  Gastrointestinal:  Negative for abdominal pain, blood in stool, constipation, diarrhea, nausea and vomiting.  Musculoskeletal:  Negative for myalgias.  Skin:  Negative for rash.    Physical Exam Triage Vital Signs ED Triage Vitals  Enc Vitals Group     BP 07/06/21 1625 (!) 160/90     Pulse Rate 07/06/21 1625 72     Resp 07/06/21 1625 18     Temp 07/06/21 1625 98 F (36.7 C)     Temp Source 07/06/21 1625 Oral  SpO2 07/06/21 1625 98 %     Weight --      Height --      Head Circumference --      Peak Flow --      Pain Score 07/06/21 1626 5     Pain Loc --      Pain Edu? --      Excl. in GC? --    No data found.  Updated Vital Signs BP 125/86   Pulse 72   Temp 98 F (36.7 C) (Oral)   Resp 18   LMP 06/13/2021   SpO2 98%   Visual Acuity Right Eye Distance:   Left Eye Distance:   Bilateral Distance:    Right Eye Near:   Left Eye Near:    Bilateral Near:     Physical Exam Vitals and nursing note reviewed.  Constitutional:      General: She is not in acute distress.    Appearance: She is normal weight. She is not toxic-appearing.  HENT:     Head: Normocephalic.     Right Ear: External ear normal.     Left Ear: External ear normal.  Eyes:     General: No scleral icterus.    Extraocular Movements: Extraocular movements intact.     Conjunctiva/sclera: Conjunctivae normal.  Cardiovascular:      Rate and Rhythm: Normal rate and regular rhythm.  Pulmonary:     Effort: Pulmonary effort is normal.     Breath sounds: Normal breath sounds. No wheezing or rales.  Abdominal:     General: Abdomen is flat.     Palpations: Abdomen is soft. There is no mass.     Tenderness: There is no guarding or rebound.     Hernia: No hernia is present.     Comments: Has mild discomfort on epigastric region  Musculoskeletal:        General: Normal range of motion.     Cervical back: Normal range of motion.  Lymphadenopathy:     Cervical: No cervical adenopathy.  Skin:    General: Skin is warm and dry.     Findings: No rash.  Neurological:     Mental Status: She is alert and oriented to person, place, and time.     Gait: Gait normal.  Psychiatric:        Mood and Affect: Mood normal.        Behavior: Behavior normal.        Thought Content: Thought content normal.        Judgment: Judgment normal.     UC Treatments / Results  Labs (all labs ordered are listed, but only abnormal results are displayed) Labs Reviewed - No data to display  EKG NSR, normal EKG  Radiology No results found.  Procedures Procedures (including critical care time)  Medications Ordered in UC Medications  alum & mag hydroxide-simeth (MAALOX/MYLANTA) 200-200-20 MG/5ML suspension 30 mL (30 mLs Oral Given 07/06/21 1647)    And  lidocaine (XYLOCAINE) 2 % viscous mouth solution 15 mL (15 mLs Oral Given 07/06/21 1648)    Initial Impression / Assessment and Plan / UC Course  I have reviewed the triage vital signs and the nursing notes. She was given GI cocktail and this helps her discomfort improve a lot. Esophagitis  I placed her on Prilosec as noted and needs to Fu with PCP  Final Clinical Impressions(s) / UC Diagnoses   Final diagnoses:  Acute esophagitis     Discharge Instructions  Follow up with your primary care doctor in 2 week       ED Prescriptions     Medication Sig Dispense  Auth. Provider   omeprazole (PRILOSEC) 20 MG capsule Take 1 capsule (20 mg total) by mouth daily. 30 capsule Rodriguez-Southworth, Nettie Elm, PA-C      PDMP not reviewed this encounter.   Garey Ham, [place] 07/06/21 1741

## 2021-07-06 NOTE — ED Triage Notes (Signed)
Pt here with substernal chest pain that is a constant dull ache since yesterday. No palpitations. No SOB

## 2021-07-12 ENCOUNTER — Ambulatory Visit: Payer: Self-pay

## 2021-07-12 NOTE — Telephone Encounter (Signed)
Please review.  Can Robynn Pane work her in sooner?  Thanks,   -Vernona Rieger

## 2021-07-12 NOTE — Telephone Encounter (Signed)
Pt. Reports she was seen 07/06/21 with acid reflux and chest pain from the reflux, per pt. Today the discomfort to middle of her chest has returned. "Feels like when the reflux started." No availability today. Appointment made for December. Requesting to "see anybody if I can be seen sooner." States "I might need to see a GI doctor too." Instructed to return to UC today, refuses.Please advise pt.    Reason for Disposition  [1] Chest pain lasts > 5 minutes AND [2] occurred > 3 days ago (72 hours) AND [3] NO chest pain or cardiac symptoms now  Answer Assessment - Initial Assessment Questions 1. LOCATION: "Where does it hurt?"       Middle 2. RADIATION: "Does the pain go anywhere else?" (e.g., into neck, jaw, arms, back)     Back 3. ONSET: "When did the chest pain begin?" (Minutes, hours or days)      Last week, but again today 4. PATTERN "Does the pain come and go, or has it been constant since it started?"  "Does it get worse with exertion?"      Constant 5. DURATION: "How long does it last" (e.g., seconds, minutes, hours)     Constant 6. SEVERITY: "How bad is the pain?"  (e.g., Scale 1-10; mild, moderate, or severe)    - MILD (1-3): doesn't interfere with normal activities     - MODERATE (4-7): interferes with normal activities or awakens from sleep    - SEVERE (8-10): excruciating pain, unable to do any normal activities       Now - 3-4 7. CARDIAC RISK FACTORS: "Do you have any history of heart problems or risk factors for heart disease?" (e.g., angina, prior heart attack; diabetes, high blood pressure, high cholesterol, smoker, or strong family history of heart disease)     No 8. PULMONARY RISK FACTORS: "Do you have any history of lung disease?"  (e.g., blood clots in lung, asthma, emphysema, birth control pills)     Asthma 9. CAUSE: "What do you think is causing the chest pain?"     Reflux 10. OTHER SYMPTOMS: "Do you have any other symptoms?" (e.g., dizziness, nausea, vomiting,  sweating, fever, difficulty breathing, cough)       No 11. PREGNANCY: "Is there any chance you are pregnant?" "When was your last menstrual period?"       No  Protocols used: Chest Pain-A-AH

## 2021-07-13 ENCOUNTER — Ambulatory Visit (INDEPENDENT_AMBULATORY_CARE_PROVIDER_SITE_OTHER): Payer: BC Managed Care – PPO | Admitting: Family Medicine

## 2021-07-13 ENCOUNTER — Other Ambulatory Visit: Payer: Self-pay

## 2021-07-13 ENCOUNTER — Encounter: Payer: Self-pay | Admitting: Family Medicine

## 2021-07-13 VITALS — BP 125/89 | HR 63 | Temp 97.3°F | Resp 16 | Wt 154.4 lb

## 2021-07-13 DIAGNOSIS — F458 Other somatoform disorders: Secondary | ICD-10-CM

## 2021-07-13 DIAGNOSIS — K219 Gastro-esophageal reflux disease without esophagitis: Secondary | ICD-10-CM

## 2021-07-13 DIAGNOSIS — F419 Anxiety disorder, unspecified: Secondary | ICD-10-CM | POA: Insufficient documentation

## 2021-07-13 DIAGNOSIS — I10 Essential (primary) hypertension: Secondary | ICD-10-CM

## 2021-07-13 DIAGNOSIS — Z23 Encounter for immunization: Secondary | ICD-10-CM | POA: Diagnosis not present

## 2021-07-13 HISTORY — DX: Gastro-esophageal reflux disease without esophagitis: K21.9

## 2021-07-13 MED ORDER — HYDROXYZINE HCL 10 MG PO TABS: 10.0000 mg | ORAL_TABLET | Freq: Three times a day (TID) | ORAL | 0 refills | Status: DC | PRN

## 2021-07-13 NOTE — Assessment & Plan Note (Signed)
Thorough talk about s/s of indigestion, EKG, and overall lifestyle- home patterns. Patient is busy mom of multiple kids, whose SO is often out of town for days at a time.  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  Encouraged self care techniques and focus on small moments.  Trial of PRN medication at this time. Patient already seeing a counselor every 2 weeks.

## 2021-07-13 NOTE — Assessment & Plan Note (Signed)
Recommend 14 day use of PPI, prilosec given recent UC visit for indigestion symptoms Recommend diet/journal of symptoms for food triggers/events

## 2021-07-13 NOTE — Progress Notes (Signed)
Established patient visit   Patient: Cynthia Crawford   DOB: 1991/06/21   30 y.o. Female  MRN: [id number] Visit Date: 07/13/2021  Today's healthcare provider: Jacky Kindle, FNP   Chief Complaint  Patient presents with   Follow-up   Subjective    HPI  Follow up for Acute Esophagitis   The patient was last seen for this 7 days ago at [facility]. Changes made at last visit include patient was started on Omeprazole 20mg  .  She reports excellent compliance with treatment. She feels that condition is Unchanged. Patient reports chest pain still centered in middle of chest radiating to her back, belching, and feeling full easily.  She is not having side effects.   -----------------------------------------------------------------------------------------   Medications: Outpatient Medications Prior to Visit  Medication Sig   cetirizine (ZYRTEC) 10 MG tablet Take 10 mg by mouth daily.   cholecalciferol (VITAMIN D3) 25 MCG (1000 UNIT) tablet Take 1,000 Units by mouth daily.   hydrochlorothiazide (HYDRODIURIL) 12.5 MG tablet TAKE 1 TABLET BY MOUTH EVERY DAY   Multiple Vitamin (MULTIVITAMIN) tablet Take by mouth daily.   omeprazole (PRILOSEC) 20 MG capsule Take 1 capsule (20 mg total) by mouth daily.   No facility-administered medications prior to visit.    Review of Systems     Objective    BP 125/89   Pulse 63   Temp (!) 97.3 F (36.3 C) (Temporal)   Resp 16   Wt 154 lb 6.4 oz (70 kg)   LMP 06/13/2021   SpO2 100%   BMI 27.35 kg/m  {Show previous vital signs (optional):23777}  Physical Exam Vitals and nursing note reviewed.  Constitutional:      General: She is not in acute distress.    Appearance: Normal appearance. She is overweight. She is not ill-appearing, toxic-appearing or diaphoretic.  HENT:     Head: Normocephalic and atraumatic.  Cardiovascular:     Rate and Rhythm: Normal rate and regular rhythm.     Pulses: Normal pulses.      Heart sounds: Normal heart sounds. No murmur heard.   No friction rub. No gallop.  Pulmonary:     Effort: Pulmonary effort is normal. No respiratory distress.     Breath sounds: Normal breath sounds. No stridor. No wheezing, rhonchi or rales.  Chest:     Chest wall: No tenderness.  Abdominal:     General: Bowel sounds are normal.     Palpations: Abdomen is soft.  Musculoskeletal:        General: No swelling, tenderness, deformity or signs of injury. Normal range of motion.     Right lower leg: No edema.     Left lower leg: No edema.  Skin:    General: Skin is warm and dry.     Capillary Refill: Capillary refill takes less than 2 seconds.     Coloration: Skin is not jaundiced or pale.     Findings: No bruising, erythema, lesion or rash.  Neurological:     General: No focal deficit present.     Mental Status: She is alert and oriented to person, place, and time. Mental status is at baseline.     Cranial Nerves: No cranial nerve deficit.     Sensory: No sensory deficit.     Motor: No weakness.     Coordination: Coordination normal.  Psychiatric:        Mood and Affect: Mood normal.        Behavior:  Behavior normal.        Thought Content: Thought content normal.        Judgment: Judgment normal.      No results found for any visits on 07/13/21.  Assessment & Plan     Problem List Items Addressed This Visit       Cardiovascular and Mediastinum   Benign essential hypertension    Chronic, stable Remains on HCTZ        Other   Need for influenza vaccination    Consent reviewed; provided today      Relevant Orders   Flu Vaccine QUAD 75mo+IM (Fluarix, Fluzone & Alfiuria Quad PF) (Completed)   Anxiety - Primary    Thorough talk about s/s of indigestion, EKG, and overall lifestyle- home patterns. Patient is busy mom of multiple kids, whose SO is often out of town for days at a time.  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  Encouraged self care techniques and focus on small  moments.  Trial of PRN medication at this time. Patient already seeing a counselor every 2 weeks.      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      Leilani Merl, FNP, have reviewed all documentation for this visit. The documentation on 07/13/21 for the exam, diagnosis, procedures, and orders are all accurate and complete.    Jacky Kindle, FNP  [facility] [phone] (phone) [phone] (fax)  [facility]

## 2021-07-13 NOTE — Assessment & Plan Note (Signed)
Chronic, stable Remains on HCTZ

## 2021-07-13 NOTE — Assessment & Plan Note (Signed)
Continues to BF her 72 month old son

## 2021-07-13 NOTE — Assessment & Plan Note (Signed)
Consent reviewed; provided today 

## 2021-08-23 ENCOUNTER — Other Ambulatory Visit: Payer: Self-pay

## 2021-08-23 ENCOUNTER — Ambulatory Visit
Admission: RE | Admit: 2021-08-23 | Discharge: 2021-08-23 | Disposition: A | Discharge status: Home / Self Care | Payer: BC Managed Care – PPO | Source: Ambulatory Visit | Attending: Emergency Medicine | Admitting: Emergency Medicine

## 2021-08-23 VITALS — BP 141/91 | HR 81 | Temp 98.9°F | Resp 18

## 2021-08-23 DIAGNOSIS — B349 Viral infection, unspecified: Secondary | ICD-10-CM | POA: Diagnosis not present

## 2021-08-23 MED ORDER — BENZONATATE 100 MG PO CAPS: 100.0000 mg | ORAL_CAPSULE | Freq: Three times a day (TID) | ORAL | 0 refills | Status: DC

## 2021-08-23 NOTE — ED Provider Notes (Signed)
Cynthia Crawford    CSN: [id number] Arrival date & time: 08/23/21  1747      History   Chief Complaint Chief Complaint  Patient presents with   Sore Throat   Cough   Headache    HPI Cynthia Crawford is a 30 y.o. female.  Patient presents with 2-day history of sore throat and cough.  She reports mild headache also.  She denies fever, shortness of breath, vomiting, diarrhea, or other symptoms.  No OTC medications taken today.  Her medical history includes asthma, hypertension, kidney stones, migraine headache, seasonal allergies, eczema.  The history is provided by the patient and medical records.   Past Medical History:  Diagnosis Date   Asthma    Contact dermatitis due to soap 11/20/2017   History of gestational hypertension    Hypertension 08/29/2017   Kidney stones     Patient Active Problem List   Diagnosis Date Noted   Need for influenza vaccination 07/13/2021   Anxiety 07/13/2021   Lactating mother 07/13/2021   Nervous indigestion 07/13/2021   Allergic asthma 01/16/2019   Seasonal allergic rhinitis due to pollen 01/16/2019   History of kidney stones 01/16/2019   Intrinsic eczema 08/05/2018   Benign essential hypertension 08/29/2017   Migraine 09/25/2011    Past Surgical History:  Procedure Laterality Date   TONSILLECTOMY      OB History     Gravida  3   Para  3   Term  3   Preterm  0   AB  0   Living  3      SAB  0   IAB  0   Ectopic  0   Multiple  0   Live Births  3            Home Medications    Prior to Admission medications   Medication Sig Start Date End Date Taking? Authorizing Provider  benzonatate (TESSALON) 100 MG capsule Take 1 capsule (100 mg total) by mouth every 8 (eight) hours. 08/23/21  Yes Sharion Balloon, NP  cetirizine (ZYRTEC) 10 MG tablet Take 10 mg by mouth daily.    [provider]  cholecalciferol (VITAMIN D3) 25 MCG (1000 UNIT) tablet Take 1,000 Units by mouth daily.    [provider]  hydrochlorothiazide (HYDRODIURIL) 12.5 MG tablet TAKE 1 TABLET BY MOUTH EVERY DAY 06/18/21   Bacigalupo, Dionne Bucy, MD  hydrOXYzine (ATARAX/VISTARIL) 10 MG tablet Take 1 tablet (10 mg total) by mouth 3 (three) times daily as needed. 07/13/21   Gwyneth Sprout, FNP  Multiple Vitamin (MULTIVITAMIN) tablet Take by mouth daily.    [provider]  omeprazole (PRILOSEC) 20 MG capsule Take 1 capsule (20 mg total) by mouth daily. 07/06/21   Rodriguez-Southworth, Sunday Spillers, PA-C    Family History Family History  Problem Relation Age of Onset   Diabetes Father    Asthma Mother     Social History Social History   Tobacco Use   Smoking status: Never   Smokeless tobacco: Never  Vaping Use   Vaping Use: Never used  Substance Use Topics   Alcohol use: No   Drug use: No     Allergies   Penicillins and Sulfa antibiotics   Review of Systems Review of Systems  Constitutional:  Negative for chills and fever.  HENT:  Positive for sore throat. Negative for ear pain.   Respiratory:  Positive for cough. Negative for shortness of breath.   Cardiovascular:  Negative  for chest pain and palpitations.  Gastrointestinal:  Negative for diarrhea and vomiting.  Skin:  Negative for color change and rash.  Neurological:  Positive for headaches. Negative for syncope.  All other systems reviewed and are negative.   Physical Exam Triage Vital Signs ED Triage Vitals  Enc Vitals Group     BP      Pulse      Resp      Temp      Temp src      SpO2      Weight      Height      Head Circumference      Peak Flow      Pain Score      Pain Loc      Pain Edu?      Excl. in [place]?    No data found.  Updated Vital Signs BP (!) 141/91 (BP Location: Left Arm)   Pulse 81   Temp 98.9 F (37.2 C) (Oral)   Resp 18   SpO2 97%   Visual Acuity Right Eye Distance:   Left Eye Distance:   Bilateral Distance:    Right Eye Near:   Left Eye Near:    Bilateral Near:     Physical  Exam Vitals and nursing note reviewed.  Constitutional:      General: She is not in acute distress.    Appearance: She is well-developed.  HENT:     Right Ear: Tympanic membrane normal.     Left Ear: Tympanic membrane normal.     Nose: Nose normal.     Mouth/Throat:     Mouth: Mucous membranes are moist.     Pharynx: Oropharynx is clear.  Cardiovascular:     Rate and Rhythm: Normal rate and regular rhythm.     Heart sounds: Normal heart sounds.  Pulmonary:     Effort: Pulmonary effort is normal. No respiratory distress.     Breath sounds: Normal breath sounds.  Abdominal:     Palpations: Abdomen is soft.     Tenderness: There is no abdominal tenderness.  Musculoskeletal:     Cervical back: Neck supple.  Skin:    General: Skin is warm and dry.  Neurological:     Mental Status: She is alert.  Psychiatric:        Mood and Affect: Mood normal.        Behavior: Behavior normal.     UC Treatments / Results  Labs (all labs ordered are listed, but only abnormal results are displayed) Labs Reviewed - No data to display  EKG   Radiology No results found.  Procedures Procedures (including critical care time)  Medications Ordered in UC Medications - No data to display  Initial Impression / Assessment and Plan / UC Course  I have reviewed the triage vital signs and the nursing notes.  Pertinent labs & imaging results that were available during my care of the patient were reviewed by me and considered in my medical decision making (see chart for details).    Viral illness.  COVID and flu pending.  Instructed patient to self quarantine per CDC guidelines.  Discussed symptomatic treatment including Tessalon Perles, Tylenol or ibuprofen, rest, hydration.  Instructed patient to follow up with PCP if symptoms are not improving.  Patient agrees to plan of care.   Final Clinical Impressions(s) / UC Diagnoses   Final diagnoses:  Viral illness     Discharge Instructions  Your COVID and Flu tests are pending.  You should self quarantine until the test results are back.    Take the [facility] as needed for cough.  Take Tylenol or ibuprofen as needed for fever or discomfort.  Rest and keep yourself hydrated.    Follow-up with your primary care provider if your symptoms are not improving.         ED Prescriptions     Medication Sig Dispense Auth. Provider   benzonatate (TESSALON) 100 MG capsule Take 1 capsule (100 mg total) by mouth every 8 (eight) hours. 21 capsule Mickie Bail, NP      PDMP not reviewed this encounter.   Mickie Bail, NP 08/23/21 [phone]

## 2021-08-23 NOTE — ED Triage Notes (Signed)
Pt here with URI sx starting 2 days ago. No fever

## 2021-08-23 NOTE — Discharge Instructions (Addendum)
Your COVID and Flu tests are pending.  You should self quarantine until the test results are back.    Take the Tessalon Perles as needed for cough.  Take Tylenol or ibuprofen as needed for fever or discomfort.  Rest and keep yourself hydrated.    Follow-up with your primary care provider if your symptoms are not improving.   

## 2021-08-24 LAB — COVID-19, FLU A+B NAA
Influenza A, NAA: NOT DETECTED
Influenza B, NAA: NOT DETECTED
SARS-CoV-2, NAA: NOT DETECTED

## 2021-09-01 ENCOUNTER — Other Ambulatory Visit: Payer: Self-pay

## 2021-09-01 ENCOUNTER — Encounter: Payer: Self-pay | Admitting: Family Medicine

## 2021-09-01 ENCOUNTER — Ambulatory Visit (INDEPENDENT_AMBULATORY_CARE_PROVIDER_SITE_OTHER): Payer: BC Managed Care – PPO | Admitting: Family Medicine

## 2021-09-01 VITALS — BP 135/95 | HR 62 | Temp 97.2°F | Resp 16 | Ht 63.0 in | Wt 156.0 lb

## 2021-09-01 DIAGNOSIS — I10 Essential (primary) hypertension: Secondary | ICD-10-CM | POA: Diagnosis not present

## 2021-09-01 DIAGNOSIS — F419 Anxiety disorder, unspecified: Secondary | ICD-10-CM

## 2021-09-01 DIAGNOSIS — K219 Gastro-esophageal reflux disease without esophagitis: Secondary | ICD-10-CM | POA: Diagnosis not present

## 2021-09-01 DIAGNOSIS — R5383 Other fatigue: Secondary | ICD-10-CM | POA: Diagnosis not present

## 2021-09-01 DIAGNOSIS — E663 Overweight: Secondary | ICD-10-CM

## 2021-09-01 MED ORDER — HYDROCHLOROTHIAZIDE 25 MG PO TABS: 25.0000 mg | ORAL_TABLET | Freq: Every day | ORAL | 1 refills | Status: DC

## 2021-09-01 NOTE — Assessment & Plan Note (Signed)
Chronic and uncontrolled Goal less than 130/80 Increase HCTZ to 25 mg daily Recheck metabolic panel Screening lipids

## 2021-09-01 NOTE — Progress Notes (Signed)
Established patient visit   Patient: Cynthia Crawford   DOB: 1991-01-02   30 y.o. Female  MRN: [id number] Visit Date: 09/01/2021  Today's healthcare provider: Shirlee Latch, MD   Chief Complaint  Patient presents with   Gastroesophageal Reflux   Anxiety   Hypertension   Fatigue   I,Sulibeya S Dimas,acting as a scribe for Shirlee Latch, MD.,have documented all relevant documentation on the behalf of Shirlee Latch, MD,as directed by  Shirlee Latch, MD while in the presence of Shirlee Latch, MD.  Subjective    HPI  Patient C/O worsening fatigue. Patient reports sleeping well however still wakes up feeling very tired. Patient requesting to have vitamin D level checked. Follow up for acid reflux  The patient was last seen for this 2 months ago. Changes made at last visit include start prilosec for 14 days.  She reports excellent compliance with treatment. She feels that condition is Improved. She is not having side effects.   ----------------------------------------------------------------------------------------- Hypertension, follow-up  BP Readings from Last 3 Encounters:  09/01/21 (!) 135/95  08/23/21 (!) 141/91  07/13/21 125/89   Wt Readings from Last 3 Encounters:  09/01/21 156 lb (70.8 kg)  07/13/21 154 lb 6.4 oz (70 kg)  07/04/21 152 lb (68.9 kg)     She was last seen for hypertension 2 months ago.  BP at that visit was 125/89. Management since that visit includes no changes.  She reports excellent compliance with treatment. She is not having side effects.  She is following a Regular diet. She is exercising. She does not smoke.  Use of agents associated with hypertension: none.   Outside blood pressures are not being checked. Symptoms: No chest pain No chest pressure  No palpitations No syncope  No dyspnea No orthopnea  No paroxysmal nocturnal dyspnea No lower extremity edema   Pertinent labs: Lab Results  Component  Value Date   CHOL 158 01/23/2019   HDL 82 01/23/2019   LDLCALC 68 01/23/2019   TRIG 39 01/23/2019   CHOLHDL 1.9 01/23/2019   Lab Results  Component Value Date   NA 140 09/19/2020   K 4.0 09/19/2020   CREATININE 0.69 09/19/2020   GFRNONAA 118 09/19/2020   GLUCOSE 83 09/19/2020   TSH 1.400 04/15/2019     The ASCVD Risk score (Arnett DK, et al., 2019) failed to calculate for the following reasons:   The 2019 ASCVD risk score is only valid for ages 67 to 9   ---------------------------------------------------------------------------------------------------  Anxiety, Follow-up  She was last seen for anxiety 2 months ago. Changes made at last visit include no changes.   She reports excellent compliance with treatment. She reports excellent tolerance of treatment. She is not having side effects.   She feels her anxiety is mild and Unchanged since last visit.  Symptoms: No chest pain No difficulty concentrating  No dizziness Yes fatigue  No feelings of losing control No insomnia  No irritable No palpitations  No panic attacks No racing thoughts  No shortness of breath No sweating  No tremors/shakes    GAD-7 Results GAD-7 Generalized Anxiety Disorder Screening Tool 09/01/2021 08/05/2018  1. Feeling Nervous, Anxious, or on Edge 0 2  2. Not Being Able to Stop or Control Worrying 0 1  3. Worrying Too Much About Different Things 0 1  4. Trouble Relaxing 0 1  5. Being So Restless it's Hard To Sit Still 0 0  6. Becoming Easily Annoyed or Irritable 0 1  7.  Feeling Afraid As If Something Awful Might Happen 0 2  Total GAD-7 Score 0 8  Difficulty At Work, Home, or Getting  Along With Others? Not difficult at all Somewhat difficult    PHQ-9 Scores PHQ9 SCORE ONLY 09/01/2021 09/19/2020 08/05/2018  PHQ-9 Total Score 1 0 3    ---------------------------------------------------------------------------------------------------    Medications: Outpatient Medications Prior to Visit   Medication Sig   benzonatate (TESSALON) 100 MG capsule Take 1 capsule (100 mg total) by mouth every 8 (eight) hours.   cetirizine (ZYRTEC) 10 MG tablet Take 10 mg by mouth daily.   hydrOXYzine (ATARAX/VISTARIL) 10 MG tablet Take 1 tablet (10 mg total) by mouth 3 (three) times daily as needed.   Multiple Vitamin (MULTIVITAMIN) tablet Take by mouth daily.   [DISCONTINUED] cholecalciferol (VITAMIN D3) 25 MCG (1000 UNIT) tablet Take 1,000 Units by mouth daily. (Patient not taking: Reported on 09/01/2021)   [DISCONTINUED] hydrochlorothiazide (HYDRODIURIL) 12.5 MG tablet TAKE 1 TABLET BY MOUTH EVERY DAY   [DISCONTINUED] omeprazole (PRILOSEC) 20 MG capsule Take 1 capsule (20 mg total) by mouth daily. (Patient not taking: Reported on 09/01/2021)   No facility-administered medications prior to visit.    Review of Systems  Constitutional:  Positive for fatigue. Negative for activity change and appetite change.  Respiratory:  Negative for chest tightness and shortness of breath.   Cardiovascular:  Negative for chest pain and palpitations.  Gastrointestinal:  Negative for abdominal distention, abdominal pain and nausea.  Psychiatric/Behavioral:  Negative for sleep disturbance. The patient is not nervous/anxious.    Last metabolic panel Lab Results  Component Value Date   GLUCOSE 83 09/19/2020   NA 140 09/19/2020   K 4.0 09/19/2020   CL 103 09/19/2020   CO2 22 09/19/2020   BUN 10 09/19/2020   CREATININE 0.69 09/19/2020   GFRNONAA 118 09/19/2020   CALCIUM 9.2 09/19/2020   PROT 8.3 (H) 11/26/2019   ALBUMIN 4.5 11/26/2019   LABGLOB 2.7 09/23/2019   AGRATIO 1.3 09/23/2019   BILITOT 0.5 11/26/2019   ALKPHOS 71 11/26/2019   AST 21 11/26/2019   ALT 20 11/26/2019   ANIONGAP 8 11/26/2019   Last lipids Lab Results  Component Value Date   CHOL 158 01/23/2019   HDL 82 01/23/2019   LDLCALC 68 01/23/2019   TRIG 39 01/23/2019   CHOLHDL 1.9 01/23/2019   Last hemoglobin A1c Lab Results   Component Value Date   HGBA1C 5.4 04/15/2019   Last vitamin D Lab Results  Component Value Date   VD25OH 20 (L) 08/26/2013       Objective    BP (!) 135/95 (BP Location: Right Arm, Patient Position: Sitting, Cuff Size: Normal)    Pulse 62    Temp (!) 97.2 F (36.2 C) (Temporal)    Resp 16    Ht 5\' 3"  (1.6 m)    Wt 156 lb (70.8 kg)    LMP 08/29/2021 (Exact Date)    SpO2 100%    Breastfeeding Yes    BMI 27.63 kg/m  BP Readings from Last 3 Encounters:  09/01/21 (!) 135/95  08/23/21 (!) 141/91  07/13/21 125/89   Wt Readings from Last 3 Encounters:  09/01/21 156 lb (70.8 kg)  07/13/21 154 lb 6.4 oz (70 kg)  07/04/21 152 lb (68.9 kg)      Physical Exam Vitals reviewed.  Constitutional:      General: She is not in acute distress.    Appearance: Normal appearance. She is well-developed. She is not diaphoretic.  HENT:     Head: Normocephalic and atraumatic.  Eyes:     General: No scleral icterus.    Conjunctiva/sclera: Conjunctivae normal.  Neck:     Thyroid: No thyromegaly.  Cardiovascular:     Rate and Rhythm: Normal rate and regular rhythm.     Pulses: Normal pulses.     Heart sounds: Normal heart sounds. No murmur heard. Pulmonary:     Effort: Pulmonary effort is normal. No respiratory distress.     Breath sounds: Normal breath sounds. No wheezing, rhonchi or rales.  Musculoskeletal:     Cervical back: Neck supple.     Right lower leg: No edema.     Left lower leg: No edema.  Lymphadenopathy:     Cervical: No cervical adenopathy.  Skin:    General: Skin is warm and dry.     Findings: No rash.  Neurological:     Mental Status: She is alert and oriented to person, place, and time. Mental status is at baseline.  Psychiatric:        Mood and Affect: Mood normal.        Behavior: Behavior normal.      No results found for any visits on 09/01/21.  Assessment & Plan     Problem List Items Addressed This Visit       Cardiovascular and Mediastinum   Benign  essential hypertension    Chronic and uncontrolled Goal less than 130/80 Increase HCTZ to 25 mg daily Recheck metabolic panel Screening lipids      Relevant Medications   hydrochlorothiazide (HYDRODIURIL) 25 MG tablet   Other Relevant Orders   Comprehensive metabolic panel   Lipid panel     Digestive   GERD (gastroesophageal reflux disease) - Primary    Much improved s/p 14 day course of PPI No longer taking this      Relevant Orders   CBC     Other   Anxiety    Improved Only in high stress situations Only taking hydroxyzine very rarely Seeing a therapist      Overweight    Discussed importance of healthy weight management Discussed diet and exercise       Relevant Orders   Comprehensive metabolic panel   CBC   VITAMIN D 25 Hydroxy (Vit-D Deficiency, Fractures)   B12   TSH   Lipid panel   Fatigue    Check labs today to see if there are any underlying etiologies Not currently on vitamin D supplement, but may need to resume pending labs      Relevant Orders   CBC   VITAMIN D 25 Hydroxy (Vit-D Deficiency, Fractures)   B12   TSH     Return in about 6 weeks (around 10/13/2021) for BP f/u.  As scheduled     I, Shirlee Latch, MD, have reviewed all documentation for this visit. The documentation on 09/01/21 for the exam, diagnosis, procedures, and orders are all accurate and complete.   Talibah Colasurdo, Marzella Schlein, MD, MPH [facility]

## 2021-09-01 NOTE — Assessment & Plan Note (Signed)
Check labs today to see if there are any underlying etiologies Not currently on vitamin D supplement, but may need to resume pending labs

## 2021-09-01 NOTE — Assessment & Plan Note (Signed)
Discussed importance of healthy weight management Discussed diet and exercise  

## 2021-09-01 NOTE — Assessment & Plan Note (Signed)
Much improved s/p 14 day course of PPI No longer taking this

## 2021-09-01 NOTE — Assessment & Plan Note (Signed)
Improved Only in high stress situations Only taking hydroxyzine very rarely Seeing a therapist

## 2021-09-04 DIAGNOSIS — K219 Gastro-esophageal reflux disease without esophagitis: Secondary | ICD-10-CM | POA: Diagnosis not present

## 2021-09-04 DIAGNOSIS — R5383 Other fatigue: Secondary | ICD-10-CM | POA: Diagnosis not present

## 2021-09-04 DIAGNOSIS — I10 Essential (primary) hypertension: Secondary | ICD-10-CM | POA: Diagnosis not present

## 2021-09-04 DIAGNOSIS — E663 Overweight: Secondary | ICD-10-CM | POA: Diagnosis not present

## 2021-09-05 LAB — VITAMIN D 25 HYDROXY (VIT D DEFICIENCY, FRACTURES): Vit D, 25-Hydroxy: 26.6 ng/mL — ABNORMAL LOW (ref 30.0–100.0)

## 2021-09-05 LAB — COMPREHENSIVE METABOLIC PANEL
ALT: 9 IU/L (ref 0–32)
AST: 18 IU/L (ref 0–40)
Albumin/Globulin Ratio: 1.7 (ref 1.2–2.2)
Albumin: 4.8 g/dL (ref 3.9–5.0)
Alkaline Phosphatase: 57 IU/L (ref 44–121)
BUN/Creatinine Ratio: 16 (ref 9–23)
BUN: 13 mg/dL (ref 6–20)
Bilirubin Total: 0.3 mg/dL (ref 0.0–1.2)
CO2: 24 mmol/L (ref 20–29)
Calcium: 9.7 mg/dL (ref 8.7–10.2)
Chloride: 98 mmol/L (ref 96–106)
Creatinine, Ser: 0.8 mg/dL (ref 0.57–1.00)
Globulin, Total: 2.9 g/dL (ref 1.5–4.5)
Glucose: 89 mg/dL (ref 70–99)
Potassium: 3.7 mmol/L (ref 3.5–5.2)
Sodium: 137 mmol/L (ref 134–144)
Total Protein: 7.7 g/dL (ref 6.0–8.5)
eGFR: 102 mL/min/{1.73_m2} (ref >59)

## 2021-09-05 LAB — CBC
Hematocrit: 38.6 % (ref 34.0–46.6)
Hemoglobin: 12.8 g/dL (ref 11.1–15.9)
MCH: 28.8 pg (ref 26.6–33.0)
MCHC: 33.2 g/dL (ref 31.5–35.7)
MCV: 87 fL (ref 79–97)
Platelets: 198 10*3/uL (ref 150–450)
RBC: 4.45 x10E6/uL (ref 3.77–5.28)
RDW: 12.2 % (ref 11.7–15.4)
WBC: 3.9 10*3/uL (ref 3.4–10.8)

## 2021-09-05 LAB — LIPID PANEL
Chol/HDL Ratio: 2.5 ratio (ref 0.0–4.4)
Cholesterol, Total: 177 mg/dL (ref 100–199)
HDL: 71 mg/dL (ref >39)
LDL Chol Calc (NIH): 96 mg/dL (ref 0–99)
Triglycerides: 47 mg/dL (ref 0–149)
VLDL Cholesterol Cal: 10 mg/dL (ref 5–40)

## 2021-09-05 LAB — VITAMIN B12: Vitamin B-12: 779 pg/mL (ref 232–1245)

## 2021-09-05 LAB — TSH: TSH: 0.836 u[IU]/mL (ref 0.450–4.500)

## 2021-09-17 NOTE — L&D Delivery Note (Addendum)
Patient: Cynthia Crawford MRN: [id number]  GBS status: negative  Patient is a 31 y.o. now G4P4 s/p NSVD at [redacted]w[redacted]d, who was admitted for IOL for [facility]. SROM 0h 36m prior to delivery with copious amounts of clear fluid.    Delivery Note At 3:01 PM a viable female was delivered via Vaginal, Spontaneous (Presentation: Left Occiput Anterior).  APGAR:8,9; weight pending.   Placenta status:  intact.  Cord: Tight nuchal, unable to reduce and with the following complications: cord avulsion.   Anesthesia: Epidural Episiotomy: None Lacerations: None Suture Repair:  n/a Est. Blood Loss (mL): 300  Mom to postpartum.  Baby to Couplet care / Skin to Skin.  Madeleine Mendelow 07/14/2022, 3:20 PM     Head delivered LOA. Tight nuchal cord present; could not reduce, cord avulsed upon delivery of shoulder and body, quickly clamped on baby's end. Infant with spontaneous cry, placed on mother's abdomen, dried and bulb suctioned. Cord blood drawn. Placenta delivered spontaneously with gentle cord traction. Fundus firm with massage and Pitocin. Small trickle although fundus firm, resolved after TXA given. Perineum inspected and found to have no lacerations.  Midwife attestation: I was gloved and present for delivery in its entirety and I agree with the above resident's note.  Julianne Handler, CNM 4:57 PM

## 2021-09-22 ENCOUNTER — Other Ambulatory Visit: Payer: Self-pay | Admitting: Family Medicine

## 2021-09-22 NOTE — Telephone Encounter (Signed)
Requested Prescriptions  Pending Prescriptions Disp Refills   hydrochlorothiazide (HYDRODIURIL) 12.5 MG tablet [Pharmacy Med Name: HYDROCHLOROTHIAZIDE 12.5 MG TB] 90 tablet 0    Sig: TAKE 1 TABLET BY MOUTH EVERY DAY     Cardiovascular: Diuretics - Thiazide Failed - 09/22/2021  7:34 PM      Failed - Last BP in normal range    BP Readings from Last 1 Encounters:  09/01/21 (!) 135/95         Passed - Ca in normal range and within 360 days    Calcium  Date Value Ref Range Status  09/04/2021 9.7 8.7 - 10.2 mg/dL Final         Passed - Cr in normal range and within 360 days    Creat  Date Value Ref Range Status  01/16/2016 0.62 0.50 - 1.10 mg/dL Final   Creatinine, Ser  Date Value Ref Range Status  09/04/2021 0.80 0.57 - 1.00 mg/dL Final   Creatinine, Urine  Date Value Ref Range Status  10/24/2019 31.20 mg/dL Final         Passed - K in normal range and within 360 days    Potassium  Date Value Ref Range Status  09/04/2021 3.7 3.5 - 5.2 mmol/L Final         Passed - Na in normal range and within 360 days    Sodium  Date Value Ref Range Status  09/04/2021 137 134 - 144 mmol/L Final         Passed - Valid encounter within last 6 months    Recent Outpatient Visits          3 weeks ago Gastroesophageal reflux disease without esophagitis   [facility] [place], Marzella Schlein, MD   2 months ago Anxiety   [facility] Jacky Kindle, FNP   1 year ago Benign essential hypertension   [facility] [place], Marzella Schlein, MD   1 year ago Mild intermittent extrinsic asthma without complication   [facility] Osvaldo Angst M, PA-C   1 year ago Mild intermittent extrinsic asthma without complication   [facility] Trey Sailors, PA-C      Future Appointments            In 3 days Bacigalupo, Marzella Schlein, MD [facility]

## 2021-09-25 ENCOUNTER — Ambulatory Visit: Payer: 59 | Admitting: Family Medicine

## 2021-10-03 NOTE — Progress Notes (Deleted)
Established patient visit   Patient: Cynthia Crawford   DOB: 11/13/1990   31 y.o. Female  MRN: [id number] Visit Date: 10/05/2021  Today's healthcare provider: Lavon Paganini, MD   No chief complaint on file.  Subjective    HPI  Hypertension, follow-up  BP Readings from Last 3 Encounters:  09/01/21 (!) 135/95  08/23/21 (!) 141/91  07/13/21 125/89   Wt Readings from Last 3 Encounters:  09/01/21 156 lb (70.8 kg)  07/13/21 154 lb 6.4 oz (70 kg)  07/04/21 152 lb (68.9 kg)     She was last seen for hypertension 6 weeks ago.  BP at that visit was 135/95. Management since that visit includes Increase HCTZ to 25 mg daily.  She reports {excellent/good/fair/poor:19665} compliance with treatment. She {is/is not:9024} having side effects. {document side effects if present:1} She is following a Regular diet. She is exercising.  Outside blood pressures are {***enter patient reported home BP readings, or 'not being checked':1}. Symptoms: {Yes/No:20286} chest pain {Yes/No:20286} chest pressure  {Yes/No:20286} palpitations {Yes/No:20286} syncope  {Yes/No:20286} dyspnea {Yes/No:20286} orthopnea  {Yes/No:20286} paroxysmal nocturnal dyspnea {Yes/No:20286} lower extremity edema   Pertinent labs: Lab Results  Component Value Date   CHOL 177 09/04/2021   HDL 71 09/04/2021   LDLCALC 96 09/04/2021   TRIG 47 09/04/2021   CHOLHDL 2.5 09/04/2021   Lab Results  Component Value Date   NA 137 09/04/2021   K 3.7 09/04/2021   CREATININE 0.80 09/04/2021   EGFR 102 09/04/2021   GLUCOSE 89 09/04/2021   TSH 0.836 09/04/2021     The ASCVD Risk score (Arnett DK, et al., 2019) failed to calculate for the following reasons:   The 2019 ASCVD risk score is only valid for ages 36 to 56   ---------------------------------------------------------------------------------------------------   Medications: Outpatient Medications Prior to Visit  Medication Sig   benzonatate (TESSALON)  100 MG capsule Take 1 capsule (100 mg total) by mouth every 8 (eight) hours.   cetirizine (ZYRTEC) 10 MG tablet Take 10 mg by mouth daily.   hydrochlorothiazide (HYDRODIURIL) 25 MG tablet Take 1 tablet (25 mg total) by mouth daily.   hydrOXYzine (ATARAX/VISTARIL) 10 MG tablet Take 1 tablet (10 mg total) by mouth 3 (three) times daily as needed.   Multiple Vitamin (MULTIVITAMIN) tablet Take by mouth daily.   No facility-administered medications prior to visit.    Review of Systems  {Labs   Heme   Chem   Endocrine   Serology   Results Review (optional):23779}   Objective    There were no vitals taken for this visit. {Show previous vital signs (optional):23777}  Physical Exam  ***  No results found for any visits on 10/05/21.  Assessment & Plan     ***  No follow-ups on file.      {provider attestation***:1}   Lavon Paganini, MD  [facility] [phone] (phone) [phone] (fax)  [place]

## 2021-10-05 ENCOUNTER — Ambulatory Visit: Payer: 59 | Admitting: Family Medicine

## 2021-10-09 ENCOUNTER — Encounter: Payer: Self-pay | Admitting: Emergency Medicine

## 2021-10-09 ENCOUNTER — Emergency Department
Admission: EM | Admit: 2021-10-09 | Discharge: 2021-10-09 | Disposition: A | Discharge status: Home / Self Care | Payer: 59 | Attending: Emergency Medicine | Admitting: Emergency Medicine

## 2021-10-09 ENCOUNTER — Ambulatory Visit: Payer: Self-pay | Admitting: *Deleted

## 2021-10-09 ENCOUNTER — Other Ambulatory Visit: Payer: Self-pay

## 2021-10-09 DIAGNOSIS — Z79899 Other long term (current) drug therapy: Secondary | ICD-10-CM | POA: Diagnosis not present

## 2021-10-09 DIAGNOSIS — R110 Nausea: Secondary | ICD-10-CM | POA: Insufficient documentation

## 2021-10-09 DIAGNOSIS — R202 Paresthesia of skin: Secondary | ICD-10-CM | POA: Diagnosis not present

## 2021-10-09 DIAGNOSIS — E876 Hypokalemia: Secondary | ICD-10-CM

## 2021-10-09 DIAGNOSIS — Z20822 Contact with and (suspected) exposure to covid-19: Secondary | ICD-10-CM | POA: Insufficient documentation

## 2021-10-09 DIAGNOSIS — R42 Dizziness and giddiness: Secondary | ICD-10-CM | POA: Diagnosis not present

## 2021-10-09 DIAGNOSIS — I10 Essential (primary) hypertension: Secondary | ICD-10-CM | POA: Insufficient documentation

## 2021-10-09 DIAGNOSIS — R519 Headache, unspecified: Secondary | ICD-10-CM | POA: Diagnosis not present

## 2021-10-09 LAB — URINALYSIS, ROUTINE W REFLEX MICROSCOPIC
Bilirubin Urine: NEGATIVE
Glucose, UA: NEGATIVE mg/dL
Hgb urine dipstick: NEGATIVE
Leukocytes,Ua: NEGATIVE
Nitrite: NEGATIVE
Protein, ur: NEGATIVE mg/dL
Specific Gravity, Urine: 1.02 (ref 1.005–1.030)
pH: 8.5 — ABNORMAL HIGH (ref 5.0–8.0)

## 2021-10-09 LAB — COMPREHENSIVE METABOLIC PANEL
ALT: 12 U/L (ref 0–44)
AST: 14 U/L — ABNORMAL LOW (ref 15–41)
Albumin: 4.3 g/dL (ref 3.5–5.0)
Alkaline Phosphatase: 45 U/L (ref 38–126)
Anion gap: 9 (ref 5–15)
BUN: 13 mg/dL (ref 6–20)
CO2: 26 mmol/L (ref 22–32)
Calcium: 9.1 mg/dL (ref 8.9–10.3)
Chloride: 100 mmol/L (ref 98–111)
Creatinine, Ser: 0.73 mg/dL (ref 0.44–1.00)
GFR, Estimated: >60 mL/min (ref >60)
Glucose, Bld: 108 mg/dL — ABNORMAL HIGH (ref 70–99)
Potassium: 3.4 mmol/L — ABNORMAL LOW (ref 3.5–5.1)
Sodium: 135 mmol/L (ref 135–145)
Total Bilirubin: 0.3 mg/dL (ref 0.3–1.2)
Total Protein: 7.8 g/dL (ref 6.5–8.1)

## 2021-10-09 LAB — CBC
HCT: 36.3 % (ref 36.0–46.0)
Hemoglobin: 12 g/dL (ref 12.0–15.0)
MCH: 29.1 pg (ref 26.0–34.0)
MCHC: 33.1 g/dL (ref 30.0–36.0)
MCV: 88.1 fL (ref 80.0–100.0)
Platelets: 187 10*3/uL (ref 150–400)
RBC: 4.12 MIL/uL (ref 3.87–5.11)
RDW: 12.7 % (ref 11.5–15.5)
WBC: 6.4 10*3/uL (ref 4.0–10.5)
nRBC: 0 % (ref 0.0–0.2)

## 2021-10-09 LAB — RESP PANEL BY RT-PCR (FLU A&B, COVID) ARPGX2
Influenza A by PCR: NEGATIVE
Influenza B by PCR: NEGATIVE
SARS Coronavirus 2 by RT PCR: NEGATIVE

## 2021-10-09 LAB — LIPASE, BLOOD: Lipase: 29 U/L (ref 11–51)

## 2021-10-09 LAB — POC URINE PREG, ED: Preg Test, Ur: NEGATIVE

## 2021-10-09 MED ORDER — POTASSIUM CHLORIDE CRYS ER 20 MEQ PO TBCR
40.0000 meq | EXTENDED_RELEASE_TABLET | Freq: Once | ORAL | Status: AC
  Administered 2021-10-09: 40 meq via ORAL
  Filled 2021-10-09: qty 2

## 2021-10-09 NOTE — ED Triage Notes (Signed)
Pt to ED via POV with c/o headache, nausea, dizziness and extremities tingling since Friday, she called to her PMD and does have an appointment tomorrow but felt that she could not wait that long. She did drink water yesterday because she felt dehydrated and began to feel better even went to work, but once she got home she started to feel bad again.

## 2021-10-09 NOTE — Telephone Encounter (Signed)
Reason for Disposition  Taking a medicine that could cause dizziness (e.g., blood pressure medications, diuretics)  Answer Assessment - Initial Assessment Questions 1. DESCRIPTION: "Describe your dizziness."     Pt c/o having a headache on Friday.   Through the weekend I'm having lightheaded and weak and having nausea.   I started drinking water.   My BP medication was increased.   I called yesterday and was told to go to the urgent care.  I didn't go to the urgent care because it was a 3 hr wait.   My BP medication was increased.   HCTZ increased from 12.5 to 25 mg.   09/22/2021 dose was increased.    2. LIGHTHEADED: "Do you feel lightheaded?" (e.g., somewhat faint, woozy, weak upon standing)     Yes.    It can last for 3 hrs or so but I eat and drink and I'm better.    No diabetes 3. VERTIGO: "Do you feel like either you or the room is spinning or tilting?" (i.e. vertigo)     *No Answer* 4. SEVERITY: "How bad is it?"  "Do you feel like you are going to faint?" "Can you stand and walk?"   - MILD: Feels slightly dizzy, but walking normally.   - MODERATE: Feels unsteady when walking, but not falling; interferes with normal activities (e.g., school, work).   - SEVERE: Unable to walk without falling, or requires assistance to walk without falling; feels like passing out now.      *No Answer* 5. ONSET:  "When did the dizziness begin?"     This started with the increase in my dose but Friday I got a headache and the dizziness got worse. 6. AGGRAVATING FACTORS: "Does anything make it worse?" (e.g., standing, change in head position)     *No Answer* 7. HEART RATE: "Can you tell me your heart rate?" "How many beats in 15 seconds?"  (Note: not all patients can do this)       *No Answer* 8. CAUSE: "What do you think is causing the dizziness?"     BP medication was increased. 9. RECURRENT SYMPTOM: "Have you had dizziness before?" If Yes, ask: "When was the last time?" "What happened that time?"     *No  Answer* 10. OTHER SYMPTOMS: "Do you have any other symptoms?" (e.g., fever, chest pain, vomiting, diarrhea, bleeding)       nausea 11. PREGNANCY: "Is there any chance you are pregnant?" "When was your last menstrual period?"       *No Answer*  Protocols used: Dizziness - Lightheadedness-A-AH

## 2021-10-09 NOTE — ED Provider Notes (Signed)
[facility] Provider Note    Event Date/Time   First MD Initiated Contact with Patient 10/09/21 2131     (approximate)   History   Headache, Nausea, Dizziness, and Numbness   HPI  Cynthia Crawford is a 31 y.o. female who presents to the ED for evaluation of Headache, Nausea, Dizziness, and Numbness   I reviewed PCP visit from 12/16.  History of anxiety, GERD and hypertension. Patient reports her HCTZ was recently doubled in dose last week by her PCP.  Patient presents to the ED for evaluation of a couple days of paresthesias to her bilateral feet, nausea and presyncopal dizziness with standing.  She reports improved symptoms and resolution of her headache when she drinks more water.  She reports no abdominal pain, emesis, diarrhea, dysuria, fever.  Denies falls or syncopal episodes.  Denies chest pain.  Physical Exam   Triage Vital Signs: ED Triage Vitals  Enc Vitals Group     BP 10/09/21 2112 (!) 143/84     Pulse Rate 10/09/21 2112 71     Resp 10/09/21 2112 17     Temp 10/09/21 2112 99 F (37.2 C)     Temp Source 10/09/21 2112 Oral     SpO2 10/09/21 2112 100 %     Weight 10/09/21 2113 151 lb (68.5 kg)     Height 10/09/21 2113 5\' 4"  (1.626 m)     Head Circumference --      Peak Flow --      Pain Score 10/09/21 2113 2     Pain Loc --      Pain Edu? --      Excl. in [place]? --     Most recent vital signs: Vitals:   10/09/21 2112  BP: (!) 143/84  Pulse: 71  Resp: 17  Temp: 99 F (37.2 C)  SpO2: 100%    General: Awake, no distress.  Ambulatory with a normal gait. CV:  Good peripheral perfusion.  Resp:  Normal effort.  Abd:  No distention.  MSK:  No deformity noted.  Neuro:  No focal deficits appreciated. Cranial nerves II through XII intact 5/5 strength and sensation in all 4 extremities Other:     ED Results / Procedures / Treatments   Labs (all labs ordered are listed, but only abnormal results are displayed) Labs  Reviewed  COMPREHENSIVE METABOLIC PANEL - Abnormal; Notable for the following components:      Result Value   Potassium 3.4 (*)    Glucose, Bld 108 (*)    AST 14 (*)    All other components within normal limits  URINALYSIS, ROUTINE W REFLEX MICROSCOPIC - Abnormal; Notable for the following components:   APPearance CLEAR (*)    pH 8.5 (*)    Ketones, ur TRACE (*)    Bacteria, UA RARE (*)    All other components within normal limits  RESP PANEL BY RT-PCR (FLU A&B, COVID) ARPGX2  LIPASE, BLOOD  CBC  POC URINE PREG, ED    EKG Sinus rhythm with a rate of 71 bpm.  Normal axis and intervals.  Nonspecific ST changes to lateral and inferior leads.  RADIOLOGY   Official radiology report(s): No results found.  PROCEDURES and INTERVENTIONS:  Procedures  Medications  potassium chloride SA (KLOR-CON M) CR tablet 40 mEq (40 mEq Oral Given 10/09/21 2217)     IMPRESSION / MDM / ASSESSMENT AND PLAN / ED COURSE  I reviewed the triage vital signs and the  nursing notes.  31 year old female presents to the ED with a couple days of paresthesias and nausea, with evidence of mild hypokalemia, and otherwise without evidence of acute pathology.  Normal vitals on room air.  She looks clinically well to me and has a benign examination.  No evidence of neurologic or vascular deficits.  Benign abdomen.  Mild hypokalemia is noted on her blood work, normal CBC and lipase.  No evidence of infectious features on her urinalysis and trace ketones suggest mild dehydration.  Improving symptoms with oral potassium supplementation and oral water.  Swab for COVID and we discussed how to check this on her MyChart results.  Return precautions for the ED discussed and she is suitable for outpatient management.  She already has follow-up with her PCP tomorrow and we discussed following up with them for potassium recheck.      FINAL CLINICAL IMPRESSION(S) / ED DIAGNOSES   Final diagnoses:  Hypokalemia  Paresthesia      Rx / DC Orders   ED Discharge Orders     None        Note:  This document was prepared using Dragon voice recognition software and may include unintentional dictation errors.   Vladimir Crofts, MD 10/09/21 2236

## 2021-10-09 NOTE — Discharge Instructions (Addendum)
Check your MyChart results for your COVID/flu test results.  Please take Tylenol and ibuprofen/Advil for your pain.  It is safe to take them together, or to alternate them every few hours.  Take up to 1000mg  of Tylenol at a time, up to 4 times per day.  Do not take more than 4000 mg of Tylenol in 24 hours.  For ibuprofen, take 400-600 mg, 4-5 times per day.

## 2021-10-09 NOTE — Telephone Encounter (Signed)
°  Chief Complaint: side effects from increase in HCTZ Symptoms: dizziness, nausea, headache Frequency: got worse over the weekend Pertinent Negatives: Patient denies not being able to ambulate. Disposition: [] ED /[] Urgent Care (no appt availability in office) / [x] Appointment(In office/virtual)/ []  [place] [facility]/ [] Home Care/ [] Refused Recommended Disposition /[] [place] [facility]/ []  Follow-up with PCP Additional Notes: Appt made for 10/10/2021 at 2:20 with .

## 2021-10-10 ENCOUNTER — Encounter: Payer: Self-pay | Admitting: Family Medicine

## 2021-10-10 ENCOUNTER — Ambulatory Visit: Payer: 59 | Admitting: Family Medicine

## 2021-10-10 ENCOUNTER — Other Ambulatory Visit: Payer: Self-pay

## 2021-10-10 VITALS — BP 129/84 | HR 71 | Resp 16 | Wt 155.7 lb

## 2021-10-10 DIAGNOSIS — Z20818 Contact with and (suspected) exposure to other bacterial communicable diseases: Secondary | ICD-10-CM | POA: Diagnosis not present

## 2021-10-10 DIAGNOSIS — J029 Acute pharyngitis, unspecified: Secondary | ICD-10-CM

## 2021-10-10 DIAGNOSIS — R21 Rash and other nonspecific skin eruption: Secondary | ICD-10-CM | POA: Diagnosis not present

## 2021-10-10 DIAGNOSIS — I10 Essential (primary) hypertension: Secondary | ICD-10-CM | POA: Diagnosis not present

## 2021-10-10 LAB — POCT RAPID STREP A (OFFICE): Rapid Strep A Screen: NEGATIVE

## 2021-10-10 MED ORDER — AZITHROMYCIN 500 MG PO TABS: 500.0000 mg | ORAL_TABLET | Freq: Every day | ORAL | 0 refills | Status: DC

## 2021-10-10 MED ORDER — CLOBETASOL PROPIONATE 0.05 % EX CREA: 1.0000 "application " | TOPICAL_CREAM | Freq: Two times a day (BID) | CUTANEOUS | 0 refills | Status: DC

## 2021-10-10 NOTE — Assessment & Plan Note (Signed)
Negative strep rapid However, exudate present on external throat on exam, no tonsils  Pt with 3 young children at home, 5,3,2 with known strep exposure

## 2021-10-10 NOTE — Progress Notes (Signed)
Established patient visit   Patient: Cynthia Crawford   DOB: 1991/08/24   30 y.o. Female  MRN: [id number] Visit Date: 10/10/2021  Today's healthcare provider: Jacky Kindle, FNP   Chief Complaint  Patient presents with   Hypertension   Sore Throat   Subjective    Sore Throat  This is a new problem. The current episode started in the past 7 days. The problem has been unchanged. There has been no fever. Associated symptoms include headaches. Pertinent negatives include no abdominal pain, congestion, coughing, diarrhea, drooling, ear discharge, ear pain, hoarse voice, plugged ear sensation, neck pain, shortness of breath, stridor, swollen glands, trouble swallowing or vomiting. She has had exposure to strep. She has tried nothing for the symptoms.   Hypertension, follow-up  BP Readings from Last 3 Encounters:  10/10/21 129/84  10/09/21 (!) 143/84  09/01/21 (!) 135/95   Wt Readings from Last 3 Encounters:  10/10/21 155 lb 11.2 oz (70.6 kg)  10/09/21 151 lb (68.5 kg)  09/01/21 156 lb (70.8 kg)     She was last seen for hypertension 1 months ago.  BP at that visit was 135/95. Management since that visit includes Chronic and uncontrolled Goal less than 130/80. Increase HCTZ to 25 mg daily,Recheck metabolic panel Screening lipids She reports good compliance with treatment. She is having side effects. Light headness and dizziness She is following a Regular diet. She is exercising. She does not smoke.  Use of agents associated with hypertension: none.   Outside blood pressures are not currently checked. Symptoms: No chest pain No chest pressure  No palpitations No syncope  No dyspnea No orthopnea  No paroxysmal nocturnal dyspnea No lower extremity edema   Pertinent labs: Lab Results  Component Value Date   CHOL 177 09/04/2021   HDL 71 09/04/2021   LDLCALC 96 09/04/2021   TRIG 47 09/04/2021   CHOLHDL 2.5 09/04/2021   Lab Results  Component Value Date   NA  135 10/09/2021   K 3.4 (L) 10/09/2021   CREATININE 0.73 10/09/2021   GFRNONAA >60 10/09/2021   GLUCOSE 108 (H) 10/09/2021   TSH 0.836 09/04/2021     The ASCVD Risk score (Arnett DK, et al., 2019) failed to calculate for the following reasons:   The 2019 ASCVD risk score is only valid for ages 17 to 46   ---------------------------------------------------------------------------------------------------   Medications: Outpatient Medications Prior to Visit  Medication Sig   cetirizine (ZYRTEC) 10 MG tablet Take 10 mg by mouth daily.   hydrochlorothiazide (HYDRODIURIL) 25 MG tablet Take 1 tablet (25 mg total) by mouth daily.   hydrOXYzine (ATARAX/VISTARIL) 10 MG tablet Take 1 tablet (10 mg total) by mouth 3 (three) times daily as needed.   Multiple Vitamin (MULTIVITAMIN) tablet Take by mouth daily.   [DISCONTINUED] benzonatate (TESSALON) 100 MG capsule Take 1 capsule (100 mg total) by mouth every 8 (eight) hours.   No facility-administered medications prior to visit.    Review of Systems  HENT:  Negative for congestion, drooling, ear discharge, ear pain, hoarse voice and trouble swallowing.   Respiratory:  Negative for cough, shortness of breath and stridor.   Gastrointestinal:  Negative for abdominal pain, diarrhea and vomiting.  Musculoskeletal:  Negative for neck pain.  Neurological:  Positive for headaches.      Objective    BP 129/84    Pulse 71    Resp 16    Wt 155 lb 11.2 oz (70.6 kg)  SpO2 100%    BMI 26.73 kg/m    Physical Exam Vitals and nursing note reviewed.  Constitutional:      General: She is not in acute distress.    Appearance: Normal appearance. She is well-developed, well-groomed and overweight. She is not ill-appearing, toxic-appearing or diaphoretic.  HENT:     Head: Normocephalic and atraumatic.     Nose: Nose normal.     Mouth/Throat:     Mouth: Mucous membranes are moist. No oral lesions.     Pharynx: Oropharynx is clear. Uvula midline. No  pharyngeal swelling, oropharyngeal exudate, posterior oropharyngeal erythema or uvula swelling.      Comments: No tonsils; however, exudate on side of month Eyes:     Conjunctiva/sclera: Conjunctivae normal.  Cardiovascular:     Rate and Rhythm: Normal rate and regular rhythm.     Pulses: Normal pulses.     Heart sounds: Normal heart sounds. No murmur heard.   No friction rub. No gallop.  Pulmonary:     Effort: Pulmonary effort is normal. No respiratory distress.     Breath sounds: Normal breath sounds. No stridor. No wheezing, rhonchi or rales.  Chest:     Chest wall: No tenderness.  Abdominal:     General: Bowel sounds are normal.     Palpations: Abdomen is soft.  Musculoskeletal:        General: No swelling, tenderness, deformity or signs of injury. Normal range of motion.     Cervical back: Normal range of motion and neck supple.     Right lower leg: No edema.     Left lower leg: No edema.  Skin:    General: Skin is warm and dry.     Capillary Refill: Capillary refill takes less than 2 seconds.     Coloration: Skin is not jaundiced or pale.     Findings: No bruising, erythema, lesion or rash.          Comments: Unknown cause; hx of allergic dermatitis   Neurological:     General: No focal deficit present.     Mental Status: She is alert and oriented to person, place, and time. Mental status is at baseline.     Cranial Nerves: No cranial nerve deficit.     Sensory: No sensory deficit.     Motor: No weakness.     Coordination: Coordination normal.  Psychiatric:        Mood and Affect: Mood normal.        Behavior: Behavior normal.        Thought Content: Thought content normal.        Judgment: Judgment normal.     No results found for any visits on 10/10/21.  Assessment & Plan     Problem List Items Addressed This Visit       Cardiovascular and Mediastinum   Benign essential hypertension    Chronic, stable Denies CP Denies SOB Denies DOE No LE Edema noted  on exam Continue medication Encourage increase water intake Seek emergent care if you develop CP, chest pain or chest pressure         Respiratory   Pharyngitis    Negative strep rapid However, exudate present on external throat on exam, no tonsils  Pt with 3 young children at home, 5,3,2 with known strep exposure      Relevant Medications   azithromycin (ZITHROMAX) 500 MG tablet     Musculoskeletal and Integument   Rash of back  Slight raised rash on back Trial of topical cream Refer to derm or allergy for testing if no response Continue contact tracing to determine cause if able; previous allergen       Relevant Medications   clobetasol cream (TEMOVATE) 0.05 %     Other   Exposure to strep throat - Primary    Known exposure from kindergarten daughter       Relevant Orders   POCT rapid strep A     Return in about 3 months (around 01/08/2022) for HTN management.      Leilani Merl, FNP, have reviewed all documentation for this visit. The documentation on 10/10/21 for the exam, diagnosis, procedures, and orders are all accurate and complete.  Clovis Fredrickson Bernis Stecher,acting as a scribe for Jacky Kindle, FNP.,have documented all relevant documentation on the behalf of Jacky Kindle, FNP,as directed by  Jacky Kindle, FNP while in the presence of Jacky Kindle, FNP.   Jacky Kindle, FNP  [facility] [phone] (phone) [phone] (fax)  [facility]

## 2021-10-10 NOTE — Assessment & Plan Note (Signed)
Chronic, stable Denies CP Denies SOB Denies DOE No LE Edema noted on exam Continue medication Encourage increase water intake Seek emergent care if you develop CP, chest pain or chest pressure

## 2021-10-10 NOTE — Assessment & Plan Note (Signed)
Slight raised rash on back Trial of topical cream Refer to derm or allergy for testing if no response Continue contact tracing to determine cause if able; previous allergen

## 2021-10-10 NOTE — Assessment & Plan Note (Signed)
Known exposure from kindergarten daughter

## 2021-10-20 ENCOUNTER — Ambulatory Visit: Payer: 59 | Admitting: Family Medicine

## 2021-11-21 ENCOUNTER — Telehealth: Payer: Self-pay | Admitting: Family Medicine

## 2021-11-21 MED ORDER — LABETALOL HCL 100 MG PO TABS: 100.0000 mg | ORAL_TABLET | Freq: Two times a day (BID) | ORAL | 2 refills | Status: DC

## 2021-11-21 NOTE — Telephone Encounter (Signed)
Patient was advised by PCP if she was expecting she would have to change her hydrochlorothiazide (HYDRODIURIL) 25 MG tablet, patient is expecting and seeking clinical advice ?

## 2021-11-21 NOTE — Telephone Encounter (Signed)
Patient advised as below.  

## 2021-11-21 NOTE — Telephone Encounter (Signed)
Switch to labetalol 100mg  BID (send new Rx and d/c HCTZ).  F/u with OB for ongoing management through pregnancy. ?

## 2021-11-28 ENCOUNTER — Other Ambulatory Visit: Payer: Self-pay | Admitting: Family Medicine

## 2021-11-28 NOTE — Telephone Encounter (Signed)
Requested medications are due for refill today.  unsure ? ?Requested medications are on the active medications list.  yes ? ?Last refill. 11/21/2021 #30 2 refills ? ?Future visit scheduled.   yes ? ?Notes to clinic.  Per note from 3/7/ 2023 pt to be followed by OB during pregnancy. ? ? ? ?Requested Prescriptions  ?Pending Prescriptions Disp Refills  ? labetalol (NORMODYNE) 100 MG tablet [Pharmacy Med Name: LABETALOL HCL 100 MG TABLET] 30 tablet 2  ?  Sig: TAKE 1 TABLET BY MOUTH TWICE A DAY  ?  ? Cardiovascular:  Beta Blockers Passed - 11/28/2021  2:31 PM  ?  ?  Passed - Last BP in normal range  ?  BP Readings from Last 1 Encounters:  ?10/10/21 129/84  ?  ?  ?  ?  Passed - Last Heart Rate in normal range  ?  Pulse Readings from Last 1 Encounters:  ?10/10/21 71  ?  ?  ?  ?  Passed - Valid encounter within last 6 months  ?  Recent Outpatient Visits   ? ?      ? 1 month ago Exposure to strep throat  ? [place] T, FNP  ? 2 months ago Gastroesophageal reflux disease without esophagitis  ? [facility], Dionne Bucy, MD  ? 4 months ago Anxiety  ? [facility] T[place] Joe T, FNP  ? 1 year ago Benign essential hypertension  ? [facility] [place], Dionne Bucy, MD  ? 1 year ago Mild intermittent extrinsic asthma without complication  ? [facility], [place], [place]  ? ?  ?  ?Future Appointments   ? ?        ? In 1 month Gwyneth Sprout, [place], [facility]  ? ?  ? ?  ?  ?  ?  ?

## 2021-12-04 ENCOUNTER — Other Ambulatory Visit: Payer: Self-pay | Admitting: *Deleted

## 2021-12-04 MED ORDER — DOXYLAMINE-PYRIDOXINE 10-10 MG PO TBEC: 2.0000 | DELAYED_RELEASE_TABLET | Freq: Every day | ORAL | 5 refills | Status: DC

## 2021-12-04 MED ORDER — PROMETHAZINE HCL 25 MG PO TABS: 25.0000 mg | ORAL_TABLET | Freq: Four times a day (QID) | ORAL | 2 refills | Status: DC | PRN

## 2021-12-13 ENCOUNTER — Other Ambulatory Visit: Payer: Self-pay | Admitting: Family Medicine

## 2021-12-19 ENCOUNTER — Ambulatory Visit (INDEPENDENT_AMBULATORY_CARE_PROVIDER_SITE_OTHER): Payer: 59 | Admitting: *Deleted

## 2021-12-19 ENCOUNTER — Ambulatory Visit (INDEPENDENT_AMBULATORY_CARE_PROVIDER_SITE_OTHER): Payer: 59

## 2021-12-19 VITALS — BP 125/80 | HR 77 | Wt 161.0 lb

## 2021-12-19 DIAGNOSIS — Z3481 Encounter for supervision of other normal pregnancy, first trimester: Secondary | ICD-10-CM | POA: Diagnosis not present

## 2021-12-19 DIAGNOSIS — O3680X0 Pregnancy with inconclusive fetal viability, not applicable or unspecified: Secondary | ICD-10-CM

## 2021-12-19 DIAGNOSIS — Z3A01 Less than 8 weeks gestation of pregnancy: Secondary | ICD-10-CM

## 2021-12-19 DIAGNOSIS — O0990 Supervision of high risk pregnancy, unspecified, unspecified trimester: Secondary | ICD-10-CM | POA: Insufficient documentation

## 2021-12-19 DIAGNOSIS — O10919 Unspecified pre-existing hypertension complicating pregnancy, unspecified trimester: Secondary | ICD-10-CM | POA: Insufficient documentation

## 2021-12-19 DIAGNOSIS — O0991 Supervision of high risk pregnancy, unspecified, first trimester: Secondary | ICD-10-CM

## 2021-12-19 NOTE — Progress Notes (Addendum)
New OB Intake ? ?I explained I am completing New OB Intake today. We discussed her EDD of 07/29/22 that is based on LMP of 10/22/21. Pt is G4/P3. I reviewed her allergies, medications, Medical/Surgical/OB history, and appropriate screenings.  ? ?Patient Active Problem List  ? Diagnosis Date Noted  ? Supervision of high-risk pregnancy 12/19/2021  ? Preexisting hypertension complicating pregnancy, antepartum 12/19/2021  ? Anxiety 07/13/2021  ? Allergic asthma 01/16/2019  ? Benign essential hypertension 08/29/2017  ? ? ?Concerns addressed today ? ?Delivery Plans:  ?Plans to deliver at [facility].  ? ?Anatomy US ?Explained first scheduled [place] will be around 19 weeks.  ? ?Labs ?Discussed Johnsie Cancel genetic screening with patient.  Routine prenatal labs needed. ? ? ?Placed OB Box on problem list and updated ? ? ?Patient informed that the ultrasound is considered a limited obstetric ultrasound and is not intended to be a complete ultrasound exam.  Patient also informed that the ultrasound is not being completed with the intent of assessing for fetal or placental anomalies or any pelvic abnormalities. Explained that the purpose of today's ultrasound is to assess for dating and fetal heart rate.  Patient acknowledges the purpose of the exam and the limitations of the study.    ? ? First visit review ?I reviewed new OB appt with pt. I explained she will have ob bloodwork with genetic screening. Explained pt will be seen by Dr Harolyn Rutherford at first visit; encounter routed to appropriate provider.  ? ? ?Verita Schneiders, MD ?12/19/2021  11:26 AM  ?

## 2022-01-01 ENCOUNTER — Other Ambulatory Visit: Payer: Self-pay | Admitting: Family Medicine

## 2022-01-02 ENCOUNTER — Encounter: Payer: Self-pay | Admitting: Obstetrics & Gynecology

## 2022-01-02 ENCOUNTER — Ambulatory Visit (INDEPENDENT_AMBULATORY_CARE_PROVIDER_SITE_OTHER): Payer: 59 | Admitting: Obstetrics & Gynecology

## 2022-01-02 VITALS — BP 136/78 | HR 86 | Wt 164.0 lb

## 2022-01-02 DIAGNOSIS — O0991 Supervision of high risk pregnancy, unspecified, first trimester: Secondary | ICD-10-CM | POA: Diagnosis not present

## 2022-01-02 DIAGNOSIS — Z3A10 10 weeks gestation of pregnancy: Secondary | ICD-10-CM

## 2022-01-02 DIAGNOSIS — O10911 Unspecified pre-existing hypertension complicating pregnancy, first trimester: Secondary | ICD-10-CM

## 2022-01-02 DIAGNOSIS — O10919 Unspecified pre-existing hypertension complicating pregnancy, unspecified trimester: Secondary | ICD-10-CM | POA: Diagnosis not present

## 2022-01-02 DIAGNOSIS — R7303 Prediabetes: Secondary | ICD-10-CM

## 2022-01-02 MED ORDER — ASPIRIN EC 81 MG PO TBEC: 81.0000 mg | DELAYED_RELEASE_TABLET | Freq: Every day | ORAL | 2 refills | Status: DC

## 2022-01-02 NOTE — Telephone Encounter (Signed)
Requested Prescriptions  ?Pending Prescriptions Disp Refills  ?? labetalol (NORMODYNE) 100 MG tablet [Pharmacy Med Name: LABETALOL HCL 100 MG TABLET] 60 tablet 1  ?  Sig: TAKE 1 TABLET BY MOUTH TWICE A DAY  ?  ? Cardiovascular:  Beta Blockers Passed - 01/01/2022  2:31 PM  ?  ?  Passed - Last BP in normal range  ?  BP Readings from Last 1 Encounters:  ?12/19/21 125/80  ?   ?  ?  Passed - Last Heart Rate in normal range  ?  Pulse Readings from Last 1 Encounters:  ?12/19/21 77  ?   ?  ?  Passed - Valid encounter within last 6 months  ?  Recent Outpatient Visits   ?      ? 2 months ago Exposure to strep throat  ? [facility] T, FNP  ? 4 months ago Gastroesophageal reflux disease without esophagitis  ? [facility] [place], Marzella Schlein, MD  ? 5 months ago Anxiety  ? [facility] Merita Norton T, FNP  ? 1 year ago Benign essential hypertension  ? [facility] [place], Marzella Schlein, MD  ? 1 year ago Mild intermittent extrinsic asthma without complication  ? [facility] [place], [place] M, [place]  ?  ?  ? ?  ?  ?  ? ? ?

## 2022-01-02 NOTE — Progress Notes (Signed)
? ?History:  ? Cynthia Crawford is a 31 y.o. [phone] at [redacted]w[redacted]d by LMP, early ultrasound being seen today for her first obstetrical visit.  Her obstetrical history is significant for  [facility], also had three term SVDs . Patient does intend to breast feed. Pregnancy history fully reviewed. ? ?Patient reports no complaints. ?  ? ?  ?HISTORY: ?OB History  ?Gravida Para Term Preterm AB Living  ?4 3 3  0 0 3  ?SAB IAB Ectopic Multiple Live Births  ?0 0 0 0 3  ?  ?# Outcome Date GA Lbr Len/2nd Weight Sex Delivery Anes PTL Lv  ?4 Current           ?3 Term 10/24/19 [redacted]w[redacted]d 05:30 / 00:36 6 lb 13.7 oz (3.11 kg) M Vag-Spont EPI  LIV  ?   Name: Coutts,BOY Laketra  ?   Apgar1: 9  Apgar5: 9  ?2 Term 05/17/18 [redacted]w[redacted]d 12:33 / 00:51 7 lb 0.5 oz (3.19 kg) M Vag-Spont EPI  LIV  ?   Name: Mendia,BOY Skila  ?   Apgar1: 8  Apgar5: 9  ?1 Term 01/18/16 [redacted]w[redacted]d / 03:20 5 lb 13.5 oz (2.651 kg) F Vag-Spont EPI, Local  LIV  ?   Name: TAMYRA, HOLLERS [place]  ?   Apgar1: 8  Apgar5: 9  ?  ?Last pap smear was done 07/04/2021 and was normal ? ?Past Medical History:  ?Diagnosis Date  ? Asthma   ? Contact dermatitis due to soap 11/20/2017  ? GERD (gastroesophageal reflux disease) 07/13/2021  ? History of gestational hypertension   ? History of kidney stones 01/16/2019  ? Hypertension 08/29/2017  ? Intrinsic eczema 08/05/2018  ? Kidney stones   ? Migraine 09/25/2011  ? ?Past Surgical History:  ?Procedure Laterality Date  ? TONSILLECTOMY    ? ?Family History  ?Problem Relation Age of Onset  ? Diabetes Father   ? Asthma Mother   ? ?Social History  ? ?Tobacco Use  ? Smoking status: Never  ? Smokeless tobacco: Never  ?Vaping Use  ? Vaping Use: Never used  ?Substance Use Topics  ? Alcohol use: No  ? Drug use: No  ? ?Allergies  ?Allergen Reactions  ? Penicillins Hives  ?  Has patient had a PCN reaction causing immediate rash, facial/tongue/throat swelling, SOB or lightheadedness with hypotension: Yes ?Has patient had a PCN reaction causing severe rash involving  mucus membranes or skin necrosis: No ?Has patient had a PCN reaction that required hospitalization No ?Has patient had a PCN reaction occurring within the last 10 years: No ?If all of the above answers are "NO", then may proceed with Cephalosporin use.  ? Sulfa Antibiotics Hives  ? ?Current Outpatient Medications on File Prior to Visit  ?Medication Sig Dispense Refill  ? cetirizine (ZYRTEC) 10 MG tablet Take 10 mg by mouth daily.    ? labetalol (NORMODYNE) 100 MG tablet TAKE 1 TABLET BY MOUTH TWICE A DAY 60 tablet 1  ? Multiple Vitamin (MULTIVITAMIN) tablet Take by mouth daily.    ? promethazine (PHENERGAN) 25 MG tablet Take 1 tablet (25 mg total) by mouth every 6 (six) hours as needed for nausea or vomiting. 30 tablet 2  ? azithromycin (ZITHROMAX) 500 MG tablet Take 1 tablet (500 mg total) by mouth daily for 5 doses. 5 tablet 0  ? Doxylamine-Pyridoxine (DICLEGIS) 10-10 MG TBEC Take 2 tablets by mouth at bedtime. If symptoms persist, add one tablet in the morning and one in the afternoon 100 tablet 5  ? ?  No current facility-administered medications on file prior to visit.  ? ? ?Review of Systems ?Pertinent items noted in HPI and remainder of comprehensive ROS otherwise negative. ? ?Physical Exam:  ? ?Vitals:  ? 01/02/22 1454  ?BP: 136/78  ?Pulse: 86  ?Weight: 164 lb (74.4 kg)  ? ?Fetal Heart Rate (bpm): 171  ?General: well-developed, well-nourished female in no acute distress  ?Breasts:  deferred  ?Skin: normal coloration and turgor, no rashes  ?Neurologic: oriented, normal, negative, normal mood  ?Extremities: normal strength, tone, and muscle mass, ROM of all joints is normal  ?HEENT PERRLA, extraocular movement intact and sclera clear, anicteric  ?Neck supple and no masses  ?Cardiovascular: regular rate and rhythm  ?Respiratory:  no respiratory distress, normal breath sounds  ?Abdomen: soft, non-tender; bowel sounds normal; no masses,  no organomegaly  ?Pelvic: deferred  ?  ?Assessment:  ?  ?Pregnancy:  [id number] ?Patient Active Problem List  ? Diagnosis Date Noted  ? Supervision of high-risk pregnancy 12/19/2021  ? Preexisting hypertension complicating pregnancy, antepartum 12/19/2021  ? Anxiety 07/13/2021  ? Allergic asthma 01/16/2019  ? Benign essential hypertension 08/29/2017  ? ?  ?Plan:  ?  ?1. Preexisting hypertension complicating pregnancy, antepartum ?2. [redacted] weeks gestation of pregnancy ?3. Supervision of high risk pregnancy in first trimester ?- CBC/D/Plt+RPR+Rh+ABO+RubIgG... ?- Culture, OB Urine ?- GC/Chlamydia probe amp ([place])not at [facility] ?- Comprehensive metabolic panel ?- Protein / creatinine ratio, urine ?- [place] MFM OB DETAIL +14 WK; Future ?- Hemoglobin A1c ?- aspirin EC 81 MG tablet; Take 1 tablet (81 mg total) by mouth daily. Take after 12 weeks for prevention of preeclampsia later in pregnancy  Dispense: 300 tablet; Refill: 2 ? ?Initial labs drawn. ?Continue prenatal vitamins. ?Continue Labetalol 100 mg po bid for now, goal BP <135/85. Patient reports lower BP at home, no changes made today but will continue to monitor, ?Problem list reviewed and updated. ?Genetic Screening discussed, Panorama and Horizon:  to be checked next visit . ?Ultrasound discussed; fetal anatomic survey: ordered. ?Anticipatory guidance about prenatal visits given including labs, ultrasounds, and testing. ?Discussed usage of the Babyscripts app for more information about pregnancy, and to track blood pressures. ?Also discussed usage of virtual visits as additional source of managing and completing prenatal visits.   ?Patient was encouraged to use MyChart to review results, send requests, and have questions addressed.   ?The nature of [place] for [facility]/Faculty Practice with multiple MDs and Advanced Practice Providers was explained to patient; also emphasized that residents, students are part of our team. ?Routine obstetric precautions reviewed. Encouraged to seek out care at office or emergency  room [facility] MAU preferred) for urgent and/or emergent concerns. ?Return in about 4 weeks (around 01/30/2022) for OFFICE OB VISIT (MD only).  ?  ? ?Verita Schneiders, MD, FACOG ?Obstetrician Social research officer, government, Faculty Practice ?[facility] [place] ? ? ?

## 2022-01-03 LAB — PROTEIN / CREATININE RATIO, URINE
Creatinine, Urine: 215.7 mg/dL
Protein, Ur: 12.8 mg/dL
Protein/Creat Ratio: 59 mg/g creat (ref 0–200)

## 2022-01-03 LAB — CBC/D/PLT+RPR+RH+ABO+RUBIGG...
Antibody Screen: NEGATIVE
Basophils Absolute: 0 10*3/uL (ref 0.0–0.2)
Basos: 0 %
EOS (ABSOLUTE): 0.2 10*3/uL (ref 0.0–0.4)
Eos: 2 %
HCV Ab: NONREACTIVE
HIV Screen 4th Generation wRfx: NONREACTIVE
Hematocrit: 37.3 % (ref 34.0–46.6)
Hemoglobin: 12.4 g/dL (ref 11.1–15.9)
Hepatitis B Surface Ag: NEGATIVE
Immature Grans (Abs): 0 10*3/uL (ref 0.0–0.1)
Immature Granulocytes: 0 %
Lymphocytes Absolute: 1.8 10*3/uL (ref 0.7–3.1)
Lymphs: 18 %
MCH: 29.5 pg (ref 26.6–33.0)
MCHC: 33.2 g/dL (ref 31.5–35.7)
MCV: 89 fL (ref 79–97)
Monocytes Absolute: 0.6 10*3/uL (ref 0.1–0.9)
Monocytes: 6 %
Neutrophils Absolute: 7.7 10*3/uL — ABNORMAL HIGH (ref 1.4–7.0)
Neutrophils: 74 %
Platelets: 180 10*3/uL (ref 150–450)
RBC: 4.21 x10E6/uL (ref 3.77–5.28)
RDW: 13 % (ref 11.7–15.4)
RPR Ser Ql: NONREACTIVE
Rh Factor: POSITIVE
Rubella Antibodies, IGG: 3.21 index (ref >0.99)
WBC: 10.4 10*3/uL (ref 3.4–10.8)

## 2022-01-03 LAB — COMPREHENSIVE METABOLIC PANEL
ALT: 17 IU/L (ref 0–32)
AST: 19 IU/L (ref 0–40)
Albumin/Globulin Ratio: 1.6 (ref 1.2–2.2)
Albumin: 4.3 g/dL (ref 3.9–5.0)
Alkaline Phosphatase: 51 IU/L (ref 44–121)
BUN/Creatinine Ratio: 11 (ref 9–23)
BUN: 8 mg/dL (ref 6–20)
Bilirubin Total: <0.2 mg/dL (ref 0.0–1.2)
CO2: 20 mmol/L (ref 20–29)
Calcium: 9 mg/dL (ref 8.7–10.2)
Chloride: 102 mmol/L (ref 96–106)
Creatinine, Ser: 0.7 mg/dL (ref 0.57–1.00)
Globulin, Total: 2.7 g/dL (ref 1.5–4.5)
Glucose: 100 mg/dL — ABNORMAL HIGH (ref 70–99)
Potassium: 4 mmol/L (ref 3.5–5.2)
Sodium: 137 mmol/L (ref 134–144)
Total Protein: 7 g/dL (ref 6.0–8.5)
eGFR: 119 mL/min/{1.73_m2} (ref >59)

## 2022-01-03 LAB — HCV INTERPRETATION

## 2022-01-03 LAB — HEMOGLOBIN A1C
Est. average glucose Bld gHb Est-mCnc: 117 mg/dL
Hgb A1c MFr Bld: 5.7 % — ABNORMAL HIGH (ref 4.8–5.6)

## 2022-01-04 LAB — CULTURE, OB URINE

## 2022-01-04 LAB — URINE CULTURE, OB REFLEX: Organism ID, Bacteria: NO GROWTH

## 2022-01-05 ENCOUNTER — Ambulatory Visit (INDEPENDENT_AMBULATORY_CARE_PROVIDER_SITE_OTHER): Payer: 59 | Admitting: *Deleted

## 2022-01-05 ENCOUNTER — Telehealth: Payer: Self-pay | Admitting: *Deleted

## 2022-01-05 DIAGNOSIS — R7303 Prediabetes: Secondary | ICD-10-CM | POA: Insufficient documentation

## 2022-01-05 DIAGNOSIS — O0991 Supervision of high risk pregnancy, unspecified, first trimester: Secondary | ICD-10-CM

## 2022-01-05 NOTE — Telephone Encounter (Signed)
Pt called stating she had another episode of blood when she wiped this morning and a small amount in the toilet. Denies any pain, cramping. Discussed with pt that this could be due to a subchorionic hemorrhage and that we could send her for a formal scan. Pt state she had that with her last pregnancy and does have a wedding to be in today, and so we will have her come in the office to a fetal HR check and if pt has another episode of bleeding this weekend she will go to MAU to be evaluated.  ?

## 2022-01-05 NOTE — Progress Notes (Signed)
Pt here to check FHR ? ?FHR via Doppler 166 ? ?Pt to follow up as needed ? ?Crosby Oyster, RN  ?

## 2022-01-07 ENCOUNTER — Inpatient Hospital Stay (HOSPITAL_COMMUNITY)
Admission: AD | Admit: 2022-01-07 | Discharge: 2022-01-07 | Disposition: A | Discharge status: Home / Self Care | Payer: 59 | Attending: Obstetrics & Gynecology | Admitting: Obstetrics & Gynecology

## 2022-01-07 ENCOUNTER — Other Ambulatory Visit: Payer: Self-pay

## 2022-01-07 ENCOUNTER — Encounter (HOSPITAL_COMMUNITY): Payer: Self-pay | Admitting: Obstetrics & Gynecology

## 2022-01-07 ENCOUNTER — Inpatient Hospital Stay (HOSPITAL_COMMUNITY): Payer: 59

## 2022-01-07 DIAGNOSIS — O10911 Unspecified pre-existing hypertension complicating pregnancy, first trimester: Secondary | ICD-10-CM | POA: Insufficient documentation

## 2022-01-07 DIAGNOSIS — O0991 Supervision of high risk pregnancy, unspecified, first trimester: Secondary | ICD-10-CM

## 2022-01-07 DIAGNOSIS — O4431 Partial placenta previa with hemorrhage, first trimester: Secondary | ICD-10-CM | POA: Diagnosis not present

## 2022-01-07 DIAGNOSIS — O4432 Partial placenta previa with hemorrhage, second trimester: Secondary | ICD-10-CM | POA: Diagnosis present

## 2022-01-07 DIAGNOSIS — Z3A11 11 weeks gestation of pregnancy: Secondary | ICD-10-CM | POA: Diagnosis not present

## 2022-01-07 DIAGNOSIS — O10919 Unspecified pre-existing hypertension complicating pregnancy, unspecified trimester: Secondary | ICD-10-CM

## 2022-01-07 DIAGNOSIS — O209 Hemorrhage in early pregnancy, unspecified: Secondary | ICD-10-CM | POA: Diagnosis not present

## 2022-01-07 LAB — URINALYSIS, ROUTINE W REFLEX MICROSCOPIC
Bilirubin Urine: NEGATIVE
Glucose, UA: NEGATIVE mg/dL
Ketones, ur: NEGATIVE mg/dL
Leukocytes,Ua: NEGATIVE
Nitrite: NEGATIVE
Protein, ur: NEGATIVE mg/dL
Specific Gravity, Urine: 1.018 (ref 1.005–1.030)
pH: 7 (ref 5.0–8.0)

## 2022-01-07 NOTE — MAU Note (Signed)
Cynthia Crawford is a 31 y.o. at [redacted]w[redacted]d here in MAU reporting: spotting with wiping.  States VB had occurred on Wednesday, Friday, and today.  States seen in office on Friday, [place] heard, instructed to to to MAU if continues.  Denies abd pain or cramping. ? ?Onset of complaint: 01/03/2022 ?Pain score: 0 ?There were no vitals filed for this visit.   ?FHT: 170 bpm ?Lab orders placed from triage:    ?

## 2022-01-07 NOTE — MAU Provider Note (Signed)
Faculty Practice OB/GYN Attending MAU Note ? ?Chief Complaint: Vaginal Bleeding ? ? ? Event Date/Time  ? First Provider Initiated Contact with Patient 01/07/22 1559   ?  ? ?SUBJECTIVE ?Cynthia Crawford is a 31 y.o. [phone] at [redacted]w[redacted]d by LMP who presents with report of noticing spotting with wiping.  Had some light vaginal bleeding a few times in the past week, had reassuring FHT in office on 01/05/22.  Had new OB visit on 01/02/22, had negative pelvic cultures and negative urine culture. Denies any abdominal pain, fevers, chills, sweats, dysuria, nausea, vomiting, other GI or GU symptoms or other general symptoms. ? ? ?Past Medical History:  ?Diagnosis Date  ? Asthma   ? Contact dermatitis due to soap 11/20/2017  ? GERD (gastroesophageal reflux disease) 07/13/2021  ? History of gestational hypertension   ? History of kidney stones 01/16/2019  ? Hypertension 08/29/2017  ? Intrinsic eczema 08/05/2018  ? Kidney stones   ? Migraine 09/25/2011  ? ?OB History  ?Gravida Para Term Preterm AB Living  ?4 3 3  0 0 3  ?SAB IAB Ectopic Multiple Live Births  ?0 0 0 0 3  ?  ?# Outcome Date GA Lbr Len/2nd Weight Sex Delivery Anes PTL Lv  ?4 Current           ?3 Term 10/24/19 [redacted]w[redacted]d 05:30 / 00:36 3110 g M Vag-Spont EPI  LIV  ?2 Term 05/17/18 [redacted]w[redacted]d 12:33 / 00:51 3190 g M Vag-Spont EPI  LIV  ?1 Term 01/18/16 [redacted]w[redacted]d / 03:20 2651 g F Vag-Spont EPI, Local  LIV  ? ?Past Surgical History:  ?Procedure Laterality Date  ? TONSILLECTOMY    ? ?Social History  ? ?Socioeconomic History  ? Marital status: Married  ?  Spouse name: Not on file  ? Number of children: 1  ? Years of education: Not on file  ? Highest education level: Not on file  ?Occupational History  ? Not on file  ?Tobacco Use  ? Smoking status: Never  ? Smokeless tobacco: Never  ?Vaping Use  ? Vaping Use: Never used  ?Substance and Sexual Activity  ? Alcohol use: No  ? Drug use: No  ? Sexual activity: Yes  ?  Partners: Male  ?  Birth control/protection: None  ?  Comment: one month ago   ?Other Topics Concern  ? Not on file  ?Social History Narrative  ? Not on file  ? ?Social Determinants of Health  ? ?Financial Resource Strain: Not on file  ?Food Insecurity: Not on file  ?Transportation Needs: Not on file  ?Physical Activity: Not on file  ?Stress: Not on file  ?Social Connections: Not on file  ?Intimate Partner Violence: Not on file  ? ?No current facility-administered medications on file prior to encounter.  ? ?Current Outpatient Medications on File Prior to Encounter  ?Medication Sig Dispense Refill  ? cetirizine (ZYRTEC) 10 MG tablet Take 10 mg by mouth daily.    ? labetalol (NORMODYNE) 100 MG tablet TAKE 1 TABLET BY MOUTH TWICE A DAY 60 tablet 1  ? Multiple Vitamin (MULTIVITAMIN) tablet Take by mouth daily.    ? aspirin EC 81 MG tablet Take 1 tablet (81 mg total) by mouth daily. Take after 12 weeks for prevention of preeclampsia later in pregnancy 300 tablet 2  ? azithromycin (ZITHROMAX) 500 MG tablet Take 1 tablet (500 mg total) by mouth daily for 5 doses. 5 tablet 0  ? Doxylamine-Pyridoxine (DICLEGIS) 10-10 MG TBEC Take 2 tablets by mouth at bedtime.  If symptoms persist, add one tablet in the morning and one in the afternoon 100 tablet 5  ? promethazine (PHENERGAN) 25 MG tablet Take 1 tablet (25 mg total) by mouth every 6 (six) hours as needed for nausea or vomiting. 30 tablet 2  ? ?Allergies  ?Allergen Reactions  ? Penicillins Hives  ?  Has patient had a PCN reaction causing immediate rash, facial/tongue/throat swelling, SOB or lightheadedness with hypotension: Yes ?Has patient had a PCN reaction causing severe rash involving mucus membranes or skin necrosis: No ?Has patient had a PCN reaction that required hospitalization No ?Has patient had a PCN reaction occurring within the last 10 years: No ?If all of the above answers are "NO", then may proceed with Cephalosporin use.  ? Sulfa Antibiotics Hives  ? ? ?ROS: Pertinent items in HPI ? ?OBJECTIVE ?BP 134/79 (BP Location: Right Arm)   Pulse  79   Temp 97.9 ?F (36.6 ?C) (Oral)   Resp 20   Ht 5\' 4"  (1.626 m)   Wt 75 kg   LMP 10/22/2021 (Exact Date)   SpO2 100%   BMI 28.37 kg/m?  ?CONSTITUTIONAL: Well-developed, well-nourished female in no acute distress.  ?SKIN: Skin is warm and dry. No rash noted. Not diaphoretic. No erythema. No pallor. ?[place]: Alert and oriented to person, place, and time. Normal reflexes, muscle tone coordination. No cranial nerve deficit noted. ?PSYCHIATRIC: Normal mood and affect. Normal behavior. Normal judgment and thought content. ?CARDIOVASCULAR: Normal heart rate noted ?RESPIRATORY: Effort and breath sounds normal, no problems with respiration noted. ?ABDOMEN: Soft, normal bowel sounds, no distention noted.  No tenderness, rebound or guarding.  ?PELVIC:Deferred ?MUSCULOSKELETAL: Normal range of motion. No tenderness.  No cyanosis, clubbing, or edema.  2+ distal pulses. ? ?LAB RESULTS ?Results for orders placed or performed during the hospital encounter of 01/07/22 (from the past 48 hour(s))  ?Urinalysis, Routine w reflex microscopic Urine, Clean Catch     Status: Abnormal  ? Collection Time: 01/07/22  4:05 PM  ?Result Value Ref Range  ? Color, Urine YELLOW YELLOW  ? APPearance CLOUDY (A) CLEAR  ? Specific Gravity, Urine 1.018 1.005 - 1.030  ? pH 7.0 5.0 - 8.0  ? Glucose, UA NEGATIVE NEGATIVE mg/dL  ? Hgb urine dipstick SMALL (A) NEGATIVE  ? Bilirubin Urine NEGATIVE NEGATIVE  ? Ketones, ur NEGATIVE NEGATIVE mg/dL  ? Protein, ur NEGATIVE NEGATIVE mg/dL  ? Nitrite NEGATIVE NEGATIVE  ? Leukocytes,Ua NEGATIVE NEGATIVE  ? WBC, UA 0-5 0 - 5 WBC/hpf  ? Bacteria, UA RARE (A) NONE SEEN  ? Squamous Epithelial / LPF 0-5 0 - 5  ? Mucus PRESENT   ? Amorphous Crystal PRESENT   ?  Comment: Performed at [facility], [place] [address], [place], [place] [address]  ? ? ?IMAGING ?US OB Comp Less 14 Wks ? ?Result Date: 01/07/2022 ?CLINICAL DATA:  Pregnant vaginal bleeding EXAM: OBSTETRIC <14 WK ULTRASOUND TECHNIQUE: Transabdominal  ultrasound was performed for evaluation of the gestation as well as the maternal uterus and adnexal regions. COMPARISON:  12/19/2021 FINDINGS: Intrauterine gestational sac: Single Yolk sac:  Visualized. Embryo:  Visualized. Cardiac Activity: Visualized. Heart Rate: 155 bpm CRL:   44.9 mm   11 w 1 d                  [place] EDC: 07/28/2022 Subchorionic hemorrhage:  None visualized. Maternal uterus/adnexae: Posterior placenta, marginal placenta previa, placental tip extending to the internal cervical os. IMPRESSION: 1. Single intrauterine gestation at sonographic gestational  age of [redacted] weeks, 1 day. Fetal heart rate 155 beats per minute. 2. Posterior placenta with marginal placenta previa in early gestation. Attention on follow-up. Electronically Signed   By: Delanna Ahmadi M.D.   On: 01/07/2022 17:54   ? ? ?MAU COURSE ?No active bleeding in MAU ?Ultrasound performed. ? ?ASSESSMENT ?1. Marginal placenta previa with bleeding in first trimester   ?2. Supervision of high risk pregnancy in first trimester   ?3. Preexisting hypertension complicating pregnancy, antepartum   ?4. Vaginal bleeding in pregnancy, first trimester   ?5. [redacted] weeks gestation of pregnancy   ? ? ?PLAN ?Reassuring FHR on ultrasound ?Bleeding precautions reviewed, pelvic rest precautions advised ?Placental location will be followed up on anatomy scan, will likely be resolved by then ?Discharged to home, follow up with appointments in office as scheduled ? ?Allergies as of 01/07/2022   ? ?   Reactions  ? Penicillins Hives  ? Has patient had a PCN reaction causing immediate rash, facial/tongue/throat swelling, SOB or lightheadedness with hypotension: Yes ?Has patient had a PCN reaction causing severe rash involving mucus membranes or skin necrosis: No ?Has patient had a PCN reaction that required hospitalization No ?Has patient had a PCN reaction occurring within the last 10 years: No ?If all of the above answers are "NO", then may proceed with Cephalosporin use.   ? Sulfa Antibiotics Hives  ? ?  ? ?  ?Medication List  ?  ? ?STOP taking these medications   ? ?azithromycin 500 MG tablet ?Commonly known as: ZITHROMAX ?  ? ?  ? ?TAKE these medications   ? ?aspirin EC 81 MG t

## 2022-01-08 ENCOUNTER — Telehealth: Payer: Self-pay | Admitting: *Deleted

## 2022-01-08 NOTE — Telephone Encounter (Signed)
Pt aware of results and will do an early 1hr gtt. Pt to come in 01/09/22 ?

## 2022-01-08 NOTE — Telephone Encounter (Signed)
-----   Message from Tereso Newcomer, MD sent at 01/05/2022  8:04 AM EDT ----- ?Early GTT (can be 1 hr or 2 hr) recommended given prediabetes.  Please call to inform patient of results and recommendations. ? ?

## 2022-01-09 ENCOUNTER — Other Ambulatory Visit: Payer: 59

## 2022-01-09 ENCOUNTER — Ambulatory Visit: Payer: 59 | Admitting: Family Medicine

## 2022-01-09 DIAGNOSIS — O0991 Supervision of high risk pregnancy, unspecified, first trimester: Secondary | ICD-10-CM

## 2022-01-09 DIAGNOSIS — R7303 Prediabetes: Secondary | ICD-10-CM

## 2022-01-10 LAB — GLUCOSE TOLERANCE, 1 HOUR: Glucose, 1Hr PP: 105 mg/dL (ref 70–199)

## 2022-01-30 ENCOUNTER — Encounter: Payer: Self-pay | Admitting: Obstetrics & Gynecology

## 2022-01-30 ENCOUNTER — Ambulatory Visit (INDEPENDENT_AMBULATORY_CARE_PROVIDER_SITE_OTHER): Payer: 59 | Admitting: Obstetrics & Gynecology

## 2022-01-30 VITALS — BP 137/85 | HR 89 | Wt 171.0 lb

## 2022-01-30 DIAGNOSIS — O219 Vomiting of pregnancy, unspecified: Secondary | ICD-10-CM

## 2022-01-30 DIAGNOSIS — Z3A14 14 weeks gestation of pregnancy: Secondary | ICD-10-CM

## 2022-01-30 DIAGNOSIS — O4432 Partial placenta previa with hemorrhage, second trimester: Secondary | ICD-10-CM

## 2022-01-30 DIAGNOSIS — O0992 Supervision of high risk pregnancy, unspecified, second trimester: Secondary | ICD-10-CM

## 2022-01-30 DIAGNOSIS — O10919 Unspecified pre-existing hypertension complicating pregnancy, unspecified trimester: Secondary | ICD-10-CM

## 2022-01-30 MED ORDER — METOCLOPRAMIDE HCL 10 MG PO TABS: 10.0000 mg | ORAL_TABLET | Freq: Four times a day (QID) | ORAL | 2 refills | Status: DC | PRN

## 2022-01-30 MED ORDER — ONDANSETRON 4 MG PO TBDP: 4.0000 mg | ORAL_TABLET | Freq: Four times a day (QID) | ORAL | 0 refills | Status: DC | PRN

## 2022-01-30 NOTE — Progress Notes (Signed)
? ?  PRENATAL VISIT NOTE ? ?Subjective:  ?Cynthia Crawford is a 31 y.o. [phone] at [redacted]w[redacted]d being seen today for ongoing prenatal care.  She is currently monitored for the following issues for this high-risk pregnancy and has Benign essential hypertension; Allergic asthma; Anxiety; Supervision of high-risk pregnancy; Preexisting hypertension complicating pregnancy, antepartum; Prediabetes; and Marginal placenta previa with bleeding in second trimester on their problem list. ? ?Patient reports nausea and vomiting and wants non-drowsy medication. No bleeding.  Contractions: Not present. Vag. Bleeding: None.  Movement: Absent. Denies leaking of fluid.  ? ?The following portions of the patient's history were reviewed and updated as appropriate: allergies, current medications, past family history, past medical history, past social history, past surgical history and problem list.  ? ?Objective:  ? ?Vitals:  ? 01/30/22 1351  ?BP: 137/85  ?Pulse: 89  ?Weight: 171 lb (77.6 kg)  ? ? ?Fetal Status: Fetal Heart Rate (bpm): 147   Movement: Absent    ? ?General:  Alert, oriented and cooperative. Patient is in no acute distress.  ?Skin: Skin is warm and dry. No rash noted.   ?Cardiovascular: Normal heart rate noted  ?Respiratory: Normal respiratory effort, no problems with respiration noted  ?Abdomen: Soft, gravid, appropriate for gestational age.  Pain/Pressure: Absent     ?Pelvic: Cervical exam deferred        ?Extremities: Normal range of motion.     ?Mental Status: Normal mood and affect. Normal behavior. Normal judgment and thought content.  ? ?Assessment and Plan:  ?Pregnancy: [id number] at [redacted]w[redacted]d ?1. Marginal placenta previa with bleeding in second trimester ?No bleeding. Follow up ultrasound scheduled later this week, will follow up results and manage accordingly. ?Continue pelvic rest for now. ? ?2. Preexisting hypertension complicating pregnancy, antepartum ?Stable BP on Labetalol 100 mg po bid.  Continue ASA.  ? ?3.  Nausea and vomiting in pregnancy ?Antiemetics prescribed, will monitor effect. ?- ondansetron (ZOFRAN-ODT) 4 MG disintegrating tablet; Take 1 tablet (4 mg total) by mouth every 6 (six) hours as needed for nausea.  Dispense: 20 tablet; Refill: 0 ?- metoCLOPramide (REGLAN) 10 MG tablet; Take 1 tablet (10 mg total) by mouth 4 (four) times daily as needed for nausea or vomiting.  Dispense: 30 tablet; Refill: 2 ? ?4. [redacted] weeks gestation of pregnancy ?5. Supervision of high risk pregnancy in second trimester ?No other complaints or concerns.  Routine obstetric precautions reviewed. ? ?Please refer to After Visit Summary for other counseling recommendations.  ? ?Return in about 4 weeks (around 02/27/2022) for OFFICE OB VISIT (MD only). ? ?Future Appointments  ?Date Time Provider [place]  ?02/01/2022  8:00 AM [facility]  ?02/21/2022  1:30 PM Donnamae Jude, MD [facility]  ?03/21/2022  9:55 AM Donnamae Jude, MD [facility]  ?04/18/2022  9:35 AM Donnamae Jude, MD [facility]  ? ? ?Verita Schneiders, MD ? ?

## 2022-02-01 ENCOUNTER — Other Ambulatory Visit: Payer: Self-pay | Admitting: Family Medicine

## 2022-02-01 ENCOUNTER — Other Ambulatory Visit: Payer: 59

## 2022-02-12 ENCOUNTER — Ambulatory Visit
Admission: RE | Admit: 2022-02-12 | Discharge: 2022-02-12 | Disposition: A | Discharge status: Home / Self Care | Payer: 59 | Source: Ambulatory Visit | Attending: Emergency Medicine | Admitting: Emergency Medicine

## 2022-02-12 VITALS — BP 143/89 | HR 74 | Temp 98.2°F | Resp 16

## 2022-02-12 DIAGNOSIS — Z3A16 16 weeks gestation of pregnancy: Secondary | ICD-10-CM | POA: Diagnosis not present

## 2022-02-12 DIAGNOSIS — J029 Acute pharyngitis, unspecified: Secondary | ICD-10-CM

## 2022-02-12 LAB — POCT RAPID STREP A (OFFICE): Rapid Strep A Screen: NEGATIVE

## 2022-02-12 NOTE — ED Triage Notes (Signed)
Pt reports sore throat since Friday. States swallowing is painful.

## 2022-02-12 NOTE — ED Provider Notes (Signed)
[name]    CSN: [id number] Arrival date & time: 02/12/22  1341      History   Chief Complaint Chief Complaint  Patient presents with   Sore Throat    Entered by patient    HPI Cynthia Crawford is a 31 y.o. female.  Patient is [redacted] weeks pregnant.  She presents with sore throat x 3 days.  She also reports mild occasional cough.  She denies fever, chills, rash, shortness of breath, abdominal pain, vomiting, diarrhea, or other symptoms.  Treatment at home with Tylenol; last taken at midnight.  Her medical history includes hypertension, asthma, prediabetes, kidney stones, migraine headaches, GERD.   The history is provided by the patient and medical records.   Past Medical History:  Diagnosis Date   Asthma    Contact dermatitis due to soap 11/20/2017   GERD (gastroesophageal reflux disease) 07/13/2021   History of gestational hypertension    History of kidney stones 01/16/2019   Hypertension 08/29/2017   Intrinsic eczema 08/05/2018   Kidney stones    Migraine 09/25/2011    Patient Active Problem List   Diagnosis Date Noted   Marginal placenta previa with bleeding in second trimester 01/07/2022   Prediabetes 01/05/2022   Supervision of high-risk pregnancy 12/19/2021   Preexisting hypertension complicating pregnancy, antepartum 12/19/2021   Anxiety 07/13/2021   Allergic asthma 01/16/2019   Benign essential hypertension 08/29/2017    Past Surgical History:  Procedure Laterality Date   TONSILLECTOMY      OB History     Gravida  4   Para  3   Term  3   Preterm  0   AB  0   Living  3      SAB  0   IAB  0   Ectopic  0   Multiple  0   Live Births  3            Home Medications    Prior to Admission medications   Medication Sig Start Date End Date Taking? Authorizing Provider  aspirin EC 81 MG tablet Take 1 tablet (81 mg total) by mouth daily. Take after 12 weeks for prevention of preeclampsia later in pregnancy 01/02/22    Anyanwu, Sallyanne Havers, MD  cetirizine (ZYRTEC) 10 MG tablet Take 10 mg by mouth daily.    [provider]  labetalol (NORMODYNE) 100 MG tablet TAKE 1 TABLET BY MOUTH TWICE A DAY 02/01/22   Bacigalupo, Dionne Bucy, MD  metoCLOPramide (REGLAN) 10 MG tablet Take 1 tablet (10 mg total) by mouth 4 (four) times daily as needed for nausea or vomiting. 01/30/22   Anyanwu, Sallyanne Havers, MD  Multiple Vitamin (MULTIVITAMIN) tablet Take by mouth daily.    [provider]  ondansetron (ZOFRAN-ODT) 4 MG disintegrating tablet Take 1 tablet (4 mg total) by mouth every 6 (six) hours as needed for nausea. 01/30/22   Osborne Oman, MD    Family History Family History  Problem Relation Age of Onset   Diabetes Father    Asthma Mother     Social History Social History   Tobacco Use   Smoking status: Never   Smokeless tobacco: Never  Vaping Use   Vaping Use: Never used  Substance Use Topics   Alcohol use: No   Drug use: No     Allergies   Penicillins and Sulfa antibiotics   Review of Systems Review of Systems  Constitutional:  Negative for chills and fever.  HENT:  Positive for sore throat. Negative for ear pain.   Respiratory:  Positive for cough. Negative for shortness of breath.   Cardiovascular:  Negative for chest pain and palpitations.  Gastrointestinal:  Negative for abdominal pain, diarrhea and vomiting.  Skin:  Negative for color change and rash.  All other systems reviewed and are negative.   Physical Exam Triage Vital Signs ED Triage Vitals  Enc Vitals Group     BP      Pulse      Resp      Temp      Temp src      SpO2      Weight      Height      Head Circumference      Peak Flow      Pain Score      Pain Loc      Pain Edu?      Excl. in [place]?    No data found.  Updated Vital Signs BP (!) 143/89 (BP Location: Left Arm)   Pulse 74   Temp 98.2 F (36.8 C) (Oral)   Resp 16   LMP 10/22/2021 (Exact Date)   SpO2 100%   Visual Acuity Right Eye Distance:    Left Eye Distance:   Bilateral Distance:    Right Eye Near:   Left Eye Near:    Bilateral Near:     Physical Exam Vitals and nursing note reviewed.  Constitutional:      General: She is not in acute distress.    Appearance: Normal appearance. She is well-developed. She is not ill-appearing.  HENT:     Right Ear: Tympanic membrane normal.     Left Ear: Tympanic membrane normal.     Nose: Nose normal.     Mouth/Throat:     Mouth: Mucous membranes are moist.     Pharynx: Oropharynx is clear.  Cardiovascular:     Rate and Rhythm: Normal rate and regular rhythm.     Heart sounds: Normal heart sounds.  Pulmonary:     Effort: Pulmonary effort is normal. No respiratory distress.     Breath sounds: Normal breath sounds.  Musculoskeletal:     Cervical back: Neck supple.  Skin:    General: Skin is warm and dry.  Neurological:     Mental Status: She is alert.  Psychiatric:        Mood and Affect: Mood normal.        Behavior: Behavior normal.     UC Treatments / Results  Labs (all labs ordered are listed, but only abnormal results are displayed) Labs Reviewed  POCT RAPID STREP A (OFFICE)    EKG   Radiology No results found.  Procedures Procedures (including critical care time)  Medications Ordered in UC Medications - No data to display  Initial Impression / Assessment and Plan / UC Course  I have reviewed the triage vital signs and the nursing notes.  Pertinent labs & imaging results that were available during my care of the patient were reviewed by me and considered in my medical decision making (see chart for details).   [redacted] weeks pregnant, sore throat. Rapid strep negative.  Discussed symptomatic care; instructed patient to take OTC medications only as directed by her OB/GYN.  She agrees to plan of care.    Final Clinical Impressions(s) / UC Diagnoses   Final diagnoses:  [redacted] weeks gestation of pregnancy  Sore throat     Discharge Instructions  The  strep test is negative.  Follow up with your primary care provider if your symptoms are not improving.        ED Prescriptions   None    PDMP not reviewed this encounter.   Sharion Balloon, NP 02/12/22 1407

## 2022-02-12 NOTE — Discharge Instructions (Addendum)
The strep test is negative.  Follow up with your primary care provider if your symptoms are not improving.

## 2022-02-21 ENCOUNTER — Encounter: Payer: Self-pay | Admitting: Family Medicine

## 2022-02-21 ENCOUNTER — Ambulatory Visit (INDEPENDENT_AMBULATORY_CARE_PROVIDER_SITE_OTHER): Payer: 59 | Admitting: Family Medicine

## 2022-02-21 VITALS — BP 124/77 | HR 82 | Wt 175.0 lb

## 2022-02-21 DIAGNOSIS — O0992 Supervision of high risk pregnancy, unspecified, second trimester: Secondary | ICD-10-CM | POA: Diagnosis not present

## 2022-02-21 DIAGNOSIS — O4432 Partial placenta previa with hemorrhage, second trimester: Secondary | ICD-10-CM

## 2022-02-21 DIAGNOSIS — O10919 Unspecified pre-existing hypertension complicating pregnancy, unspecified trimester: Secondary | ICD-10-CM

## 2022-02-21 NOTE — Progress Notes (Signed)
   PRENATAL VISIT NOTE  Subjective:  Cynthia Crawford is a 31 y.o. G4P3003 at [redacted]w[redacted]d being seen today for ongoing prenatal care.  She is currently monitored for the following issues for this high-risk pregnancy and has Benign essential hypertension; Allergic asthma; Supervision of high-risk pregnancy; Preexisting hypertension complicating pregnancy, antepartum; Prediabetes; and Marginal placenta previa with bleeding in second trimester on their problem list.  Patient reports no complaints.  Contractions: Not present. Vag. Bleeding: None.  Movement: Present. Denies leaking of fluid.   The following portions of the patient's history were reviewed and updated as appropriate: allergies, current medications, past family history, past medical history, past social history, past surgical history and problem list.   Objective:   Vitals:   02/21/22 1338  BP: 124/77  Pulse: 82  Weight: 175 lb (79.4 kg)    Fetal Status: Fetal Heart Rate (bpm): 154   Movement: Present     General:  Alert, oriented and cooperative. Patient is in no acute distress.  Skin: Skin is warm and dry. No rash noted.   Cardiovascular: Normal heart rate noted  Respiratory: Normal respiratory effort, no problems with respiration noted  Abdomen: Soft, gravid, appropriate for gestational age.  Pain/Pressure: Absent     Pelvic: Cervical exam deferred        Extremities: Normal range of motion.  Edema: None  Mental Status: Normal mood and affect. Normal behavior. Normal judgment and thought content.   Assessment and Plan:  Pregnancy: G4P3003 at [redacted]w[redacted]d 1. Preexisting hypertension complicating pregnancy, antepartum On labetalol and ASA  2. Supervision of high risk pregnancy in second trimester AFP today - AFP, Serum, Open Spina Bifida  3. Marginal placenta previa with bleeding in second trimester No further bleeding Has f/u US for anatomy in a few weeks  Preterm labor symptoms and general obstetric precautions  including but not limited to vaginal bleeding, contractions, leaking of fluid and fetal movement were reviewed in detail with the patient. Please refer to After Visit Summary for other counseling recommendations.   Return in 4 weeks (on 03/21/2022).  Future Appointments  Date Time Provider Dennis  03/06/2022 10:00 AM [facility]  03/21/2022  9:55 AM Donnamae Jude, MD [facility]  04/18/2022  9:35 AM Donnamae Jude, MD [facility]    Donnamae Jude, MD

## 2022-02-21 NOTE — Progress Notes (Signed)
ROB [redacted]w[redacted]d   CC: NONE

## 2022-02-23 LAB — AFP, SERUM, OPEN SPINA BIFIDA
AFP MoM: 1.1
AFP Value: 44.7 ng/mL
Gest. Age on Collection Date: 17.3 weeks
Maternal Age At EDD: 31.4 yr
OSBR Risk 1 IN: 10000
Test Results:: NEGATIVE
Weight: 175 [lb_av]

## 2022-02-26 DIAGNOSIS — L400 Psoriasis vulgaris: Secondary | ICD-10-CM | POA: Diagnosis not present

## 2022-03-03 ENCOUNTER — Other Ambulatory Visit: Payer: Self-pay | Admitting: Family Medicine

## 2022-03-05 NOTE — Telephone Encounter (Signed)
Requested Prescriptions  Pending Prescriptions Disp Refills  . labetalol (NORMODYNE) 100 MG tablet [Pharmacy Med Name: LABETALOL HCL 100 MG TABLET] 60 tablet 1    Sig: TAKE 1 TABLET BY MOUTH TWICE A DAY     Cardiovascular:  Beta Blockers Passed - 03/03/2022  8:31 AM      Passed - Last BP in normal range    BP Readings from Last 1 Encounters:  02/21/22 124/77         Passed - Last Heart Rate in normal range    Pulse Readings from Last 1 Encounters:  02/21/22 82         Passed - Valid encounter within last 6 months    Recent Outpatient Visits          4 months ago Exposure to strep throat   [facility] Merita Norton T, FNP   6 months ago Gastroesophageal reflux disease without esophagitis   [facility], Marzella Schlein, MD   7 months ago Anxiety   [facility] Jacky Kindle, FNP   1 year ago Benign essential hypertension   [facility], Marzella Schlein, MD   2 years ago Mild intermittent extrinsic asthma without complication   [facility], Lavella Hammock, [place]

## 2022-03-06 ENCOUNTER — Other Ambulatory Visit: Payer: Self-pay

## 2022-03-06 ENCOUNTER — Ambulatory Visit (HOSPITAL_BASED_OUTPATIENT_CLINIC_OR_DEPARTMENT_OTHER): Payer: 59 | Admitting: Obstetrics and Gynecology

## 2022-03-06 ENCOUNTER — Ambulatory Visit: Payer: 59 | Attending: Obstetrics and Gynecology

## 2022-03-06 DIAGNOSIS — O10012 Pre-existing essential hypertension complicating pregnancy, second trimester: Secondary | ICD-10-CM | POA: Insufficient documentation

## 2022-03-06 DIAGNOSIS — Z3A19 19 weeks gestation of pregnancy: Secondary | ICD-10-CM

## 2022-03-06 DIAGNOSIS — O10919 Unspecified pre-existing hypertension complicating pregnancy, unspecified trimester: Secondary | ICD-10-CM

## 2022-03-06 DIAGNOSIS — O4412 Placenta previa with hemorrhage, second trimester: Secondary | ICD-10-CM | POA: Insufficient documentation

## 2022-03-06 DIAGNOSIS — Z79899 Other long term (current) drug therapy: Secondary | ICD-10-CM | POA: Insufficient documentation

## 2022-03-06 DIAGNOSIS — O10912 Unspecified pre-existing hypertension complicating pregnancy, second trimester: Secondary | ICD-10-CM

## 2022-03-06 DIAGNOSIS — O0991 Supervision of high risk pregnancy, unspecified, first trimester: Secondary | ICD-10-CM

## 2022-03-06 DIAGNOSIS — O0992 Supervision of high risk pregnancy, unspecified, second trimester: Secondary | ICD-10-CM | POA: Insufficient documentation

## 2022-03-06 DIAGNOSIS — Z7982 Long term (current) use of aspirin: Secondary | ICD-10-CM | POA: Diagnosis not present

## 2022-03-06 DIAGNOSIS — Z363 Encounter for antenatal screening for malformations: Secondary | ICD-10-CM | POA: Insufficient documentation

## 2022-03-06 NOTE — Progress Notes (Signed)
Maternal-Fetal Medicine   Name: Cynthia Crawford DOB: May 03, 1991 MRN: [id number] Referring Provider: Tinnie Gens, MD  I had the pleasure of seeing [name] today at [facility].  She was accompanied by her husband.  She is G4 P3003 at 19w 2d gestation and is here for fetal anatomy scan. She has chronic hypertension and takes labetalol.  Her blood pressures have been normal at prenatal visits. MSAFP screening showed low risk for open neural tube defects.  Patient had opted not to screen for fetal aneuploidies.  Hemoglobin A1c was 5.7% and early screening ruled out gestational diabetes. Obstetric history significant for 3 term vaginal deliveries.  Patient had chronic hypertension and a previous pregnancy and was taking labetalol. On early ultrasound performed at [redacted] weeks gestation, placenta previa was suspected.  Ultrasound We performed fetal anatomical survey.  Amniotic fluid is normal and good fetal activity seen.  Fetal biometry is consistent with the previously established dates.  No markers of aneuploidies or fetal structural defects are seen. Placenta is anterior and there is no evidence of previa or low-lying placenta.  Chronic hypertension -Adverse outcomes of severe chronic hypertension include maternal stroke, endorgan damage, coagulation disturbances. Placental abruption is more common. -Superimposed preeclampsia occurs in more than 30% of women with chronic hypertension I discussed the benefit of low-dose aspirin prophylaxis that helps delaying or preventing preeclampsia. -I discussed the safety profile of antihypertensives.  Labetalol can be safely given in pregnancy.  It can be associated with low birthweights.  Alternative medications include nifedipine. -I discussed ultrasound protocol of monitoring fetal growth assessment and antenatal testing. -Timing of delivery: Provided her blood pressures are well controlled, she can be delivered at 39 weeks' gestation.   Early term delivery is an option if hypertension is not well controlled.  Recommendations -An appointment was made for her to return in 5 weeks for fetal growth assessment. Fetal growth assessments every 4 weeks till delivery. -Weekly BPP from [redacted] weeks gestation till delivery. -Continue low-dose aspirin. -Delivery at [redacted] weeks gestation provided her blood pressures are well controlled.  Thank you for consultation.  If you have any questions or concerns, please contact me the [facility].  Consultation including face-to-face counseling (more than 50% of time spent) is 45 minutes.

## 2022-03-21 ENCOUNTER — Encounter: Payer: Self-pay | Admitting: Family Medicine

## 2022-03-21 ENCOUNTER — Ambulatory Visit (INDEPENDENT_AMBULATORY_CARE_PROVIDER_SITE_OTHER): Payer: 59 | Admitting: Family Medicine

## 2022-03-21 VITALS — BP 117/79 | HR 81 | Wt 178.4 lb

## 2022-03-21 DIAGNOSIS — O10919 Unspecified pre-existing hypertension complicating pregnancy, unspecified trimester: Secondary | ICD-10-CM

## 2022-03-21 DIAGNOSIS — O0992 Supervision of high risk pregnancy, unspecified, second trimester: Secondary | ICD-10-CM

## 2022-03-21 DIAGNOSIS — O4432 Partial placenta previa with hemorrhage, second trimester: Secondary | ICD-10-CM

## 2022-03-21 NOTE — Progress Notes (Signed)
   PRENATAL VISIT NOTE  Subjective:  Cynthia Crawford is a 31 y.o. G4P3003 at [redacted]w[redacted]d being seen today for ongoing prenatal care.  She is currently monitored for the following issues for this high-risk pregnancy and has Benign essential hypertension; Allergic asthma; Supervision of high-risk pregnancy; Preexisting hypertension complicating pregnancy, antepartum; Prediabetes; Marginal placenta previa with bleeding in second trimester; and Subluxation of patellofemoral joint on their problem list.  Patient reports no complaints.  Contractions: Irritability. Vag. Bleeding: None.  Movement: Present. Denies leaking of fluid.   The following portions of the patient's history were reviewed and updated as appropriate: allergies, current medications, past family history, past medical history, past social history, past surgical history and problem list.   Objective:   Vitals:   03/21/22 1000  BP: 117/79  Pulse: 81  Weight: 178 lb 6.4 oz (80.9 kg)    Fetal Status: Fetal Heart Rate (bpm): 145 Fundal Height: 21 cm Movement: Present     General:  Alert, oriented and cooperative. Patient is in no acute distress.  Skin: Skin is warm and dry. No rash noted.   Cardiovascular: Normal heart rate noted  Respiratory: Normal respiratory effort, no problems with respiration noted  Abdomen: Soft, gravid, appropriate for gestational age.  Pain/Pressure: Absent     Pelvic: Cervical exam deferred        Extremities: Normal range of motion.  Edema: Mild pitting, slight indentation  Mental Status: Normal mood and affect. Normal behavior. Normal judgment and thought content.   Assessment and Plan:  Pregnancy: G4P3003 at 108w3d 1. Supervision of high risk pregnancy in second trimester Continue prenatal care.  2. Preexisting hypertension complicating pregnancy, antepartum On Labetalol  and ASA Serial u/s for growth are scheduled  3. Marginal placenta previa with bleeding in second  trimester resolved  Preterm labor symptoms and general obstetric precautions including but not limited to vaginal bleeding, contractions, leaking of fluid and fetal movement were reviewed in detail with the patient. Please refer to After Visit Summary for other counseling recommendations.   Return in 4 weeks (on 04/18/2022).  Future Appointments  Date Time Provider Department Center  04/12/2022  9:00 AM [facility]  04/18/2022  9:35 AM Reva Bores, MD [facility]    Reva Bores, MD

## 2022-04-03 ENCOUNTER — Other Ambulatory Visit: Payer: Self-pay | Admitting: Family Medicine

## 2022-04-10 ENCOUNTER — Other Ambulatory Visit: Payer: Self-pay

## 2022-04-10 DIAGNOSIS — O10012 Pre-existing essential hypertension complicating pregnancy, second trimester: Secondary | ICD-10-CM

## 2022-04-12 ENCOUNTER — Other Ambulatory Visit: Payer: Self-pay

## 2022-04-12 ENCOUNTER — Ambulatory Visit: Payer: 59 | Attending: Obstetrics

## 2022-04-12 VITALS — BP 125/82 | HR 71 | Temp 98.2°F | Ht 64.0 in | Wt 183.0 lb

## 2022-04-12 DIAGNOSIS — O1012 Pre-existing hypertensive heart disease complicating childbirth: Secondary | ICD-10-CM | POA: Diagnosis not present

## 2022-04-12 DIAGNOSIS — O10012 Pre-existing essential hypertension complicating pregnancy, second trimester: Secondary | ICD-10-CM | POA: Diagnosis not present

## 2022-04-12 DIAGNOSIS — O0992 Supervision of high risk pregnancy, unspecified, second trimester: Secondary | ICD-10-CM

## 2022-04-12 DIAGNOSIS — O10919 Unspecified pre-existing hypertension complicating pregnancy, unspecified trimester: Secondary | ICD-10-CM

## 2022-04-12 DIAGNOSIS — O4432 Partial placenta previa with hemorrhage, second trimester: Secondary | ICD-10-CM

## 2022-04-12 DIAGNOSIS — Z3A24 24 weeks gestation of pregnancy: Secondary | ICD-10-CM | POA: Insufficient documentation

## 2022-04-13 ENCOUNTER — Encounter: Payer: Self-pay | Admitting: Family Medicine

## 2022-04-18 ENCOUNTER — Telehealth (INDEPENDENT_AMBULATORY_CARE_PROVIDER_SITE_OTHER): Payer: 59 | Admitting: Family Medicine

## 2022-04-18 ENCOUNTER — Encounter: Payer: 59 | Admitting: Family Medicine

## 2022-04-18 ENCOUNTER — Encounter: Payer: Self-pay | Admitting: Family Medicine

## 2022-04-18 DIAGNOSIS — R7303 Prediabetes: Secondary | ICD-10-CM

## 2022-04-18 DIAGNOSIS — Z3A25 25 weeks gestation of pregnancy: Secondary | ICD-10-CM

## 2022-04-18 DIAGNOSIS — O99282 Endocrine, nutritional and metabolic diseases complicating pregnancy, second trimester: Secondary | ICD-10-CM

## 2022-04-18 DIAGNOSIS — O10912 Unspecified pre-existing hypertension complicating pregnancy, second trimester: Secondary | ICD-10-CM

## 2022-04-18 DIAGNOSIS — O4432 Partial placenta previa with hemorrhage, second trimester: Secondary | ICD-10-CM

## 2022-04-18 DIAGNOSIS — O0992 Supervision of high risk pregnancy, unspecified, second trimester: Secondary | ICD-10-CM

## 2022-04-18 DIAGNOSIS — O10919 Unspecified pre-existing hypertension complicating pregnancy, unspecified trimester: Secondary | ICD-10-CM

## 2022-04-18 NOTE — Progress Notes (Signed)
OBSTETRICS PRENATAL VIRTUAL VISIT ENCOUNTER NOTE  Provider location: [facility] [place] [facility]   Patient location: Home  I connected with Cynthia Crawford on 04/18/22 at  1:50 PM EDT by MyChart Video Encounter and verified that I am speaking with the correct person using two identifiers. I discussed the limitations, risks, security and privacy concerns of performing an evaluation and management service virtually and the availability of in person appointments. I also discussed with the patient that there may be a patient responsible charge related to this service. The patient expressed understanding and agreed to proceed. Subjective:  Cynthia Crawford is a 31 y.o. G4P3003 at [redacted]w[redacted]d being seen today for ongoing prenatal care.  She is currently monitored for the following issues for this high-risk pregnancy and has Benign essential hypertension; Allergic asthma; Supervision of high-risk pregnancy; Preexisting hypertension complicating pregnancy, antepartum; Prediabetes; and Subluxation of patellofemoral joint on their problem list.  Patient reports no complaints.  Contractions: Irritability. Vag. Bleeding: None.  Movement: Present. Denies any leaking of fluid.   The following portions of the patient's history were reviewed and updated as appropriate: allergies, current medications, past family history, past medical history, past social history, past surgical history and problem list.   Objective:  There were no vitals filed for this visit.  Fetal Status:     Movement: Present     General:  Alert, oriented and cooperative. Patient is in no acute distress.  Respiratory: Normal respiratory effort, no problems with respiration noted  Mental Status: Normal mood and affect. Normal behavior. Normal judgment and thought content.  Rest of physical exam deferred due to type of encounter  Imaging: [place] MFM OB FOLLOW UP  Result Date:  04/12/2022 ----------------------------------------------------------------------  OBSTETRICS REPORT                       (Signed Final 04/12/2022 09:44 am) ---------------------------------------------------------------------- Patient Info  ID #:       [id number]                          D.O.B.:  24-May-1991 (31 yrs)  Name:       Cynthia Crawford                Visit Date: 04/12/2022 09:22 am              [place] ---------------------------------------------------------------------- Performed By  Attending:        Johnell Comings MD         Secondary Phy.:   Osborne Oman MD  Performed By:     Eveline Keto         Address:          [address]  [place], [place]  Referred By:      [place]       [facility]                                                             [facility]  Ref. Address:     [place] ---------------------------------------------------------------------- Orders  #  Description                           Code        Ordered By  1  [place] MFM OB FOLLOW UP                   [phone]    Verita Schneiders ----------------------------------------------------------------------  #  Order #                     Accession #                Episode #  1  [id number]                   [id number]                 [id number] ---------------------------------------------------------------------- Indications  Pre-existing essential hypertension            0000000  complicating pregnancy, second trimester  (Labetolol, ASA)  [redacted] weeks gestation of pregnancy                Z3A.24  AFP:Neg ---------------------------------------------------------------------- Fetal Evaluation  Num Of  Fetuses:         1  Fetal Heart Rate(bpm):  138  Cardiac Activity:       Observed  Presentation:           Breech  Placenta:               Posterior  P. Cord Insertion:      Previously Visualized  Amniotic Fluid  AFI FV:      Within normal limits  AFI Sum(cm)     %Tile       Largest Pocket(cm)  17.98           70          7.05  RUQ(cm)       RLQ(cm)       LUQ(cm)  LLQ(cm)  4.19          3.77          2.97           7.05 ---------------------------------------------------------------------- Biometry  BPD:     58.54  mm     G. Age:  24w 0d         22  %    CI:        72.32   %    70 - 86                                                          FL/HC:      18.2   %    18.7 - 20.3  HC:       219   mm     G. Age:  24w 0d         12  %    HC/AC:      1.11        1.04 - 1.22  AC:    196.95   mm     G. Age:  24w 3d         34  %    FL/BPD:     68.1   %    71 - 87  FL:      39.85  mm     G. Age:  22w 6d        3.5  %    FL/AC:      20.2   %    20 - 24  CER:      28.4  mm     G. Age:  25w 1d         71  %  LV:        3.4  mm  CM:        5.7  mm  Est. FW:     625  gm      1 lb 6 oz     12  % ---------------------------------------------------------------------- OB History  Gravidity:    4         Term:   3        Prem:   0        SAB:   0  TOP:          0       Ectopic:  0        Living: 3 ---------------------------------------------------------------------- Gestational Age  LMP:           24w 4d        Date:  10/22/21                   EDD:   07/29/22  U/S Today:     23w 6d                                        EDD:   08/03/22  Best:          24w 4d     Det. By:  LMP  (10/22/21)          EDD:   07/29/22 ---------------------------------------------------------------------- Anatomy  Cranium:  Appears normal         LVOT:                   Appears normal  Cavum:                 Appears normal         Aortic Arch:            Previously seen  Ventricles:            Appears normal         Ductal Arch:             Previously seen  Choroid Plexus:        Appears normal         Diaphragm:              Appears normal  Cerebellum:            Appears normal         Stomach:                Appears normal, left                                                                        sided  Posterior Fossa:       Appears normal         Abdomen:                Appears normal  Nuchal Fold:           Previously seen        Abdominal Wall:         Previously seen  Face:                  Orbits and profile     Cord Vessels:           Previously seen                         previously seen  Lips:                  Previously seen        Kidneys:                Appear normal  Palate:                Appears normal         Bladder:                Appears normal  Thoracic:              Appears normal         Spine:                  Previously seen  Heart:                 Appears normal         Upper Extremities:      Previously seen                         (4CH, axis, and  situs)  RVOT:                  Appears normal         Lower Extremities:      Previously seen  Other:  Female gender previously seen. Hands and feet visualized. Open          hands visualized. Nasal bone, VC, 3VV  previously visualized. 3VTV          visualized. ---------------------------------------------------------------------- Cervix Uterus Adnexa  Cervix  Length:            4.5  cm.  Normal appearance by transabdominal scan.  Uterus  No abnormality visualized.  Right Ovary  Not visualized.  Left Ovary  Not visualized. ---------------------------------------------------------------------- Comments  This patient was seen for a follow up growth scan due to  chronic hypertension treated with labetalol 100 mg twice a  day.  She denies any problems since her last exam.  She was informed that the fetal growth and amniotic fluid  level appears appropriate for her gestational age.  A follow up exam was scheduled in 4 weeks.  Due to chronic hypertension  treated with labetalol, weekly  fetal testing is recommended starting at 32 weeks. ----------------------------------------------------------------------                   Johnell Comings, MD Electronically Signed Final Report   04/12/2022 09:44 am ----------------------------------------------------------------------   Assessment and Plan:  Pregnancy: [id number] at [redacted]w[redacted]d 1. Supervision of high risk pregnancy in second trimester Continue  prenatal care.  2. Preexisting hypertension complicating pregnancy, antepartum On Labetalol and ASA BP ok with MFM last week Recent growth WNL  3. Marginal placenta previa with intrapartum hemorrhage in second trimester resolved  4. Prediabetes Normal early 1 hour--2 hour next visit.  Preterm labor symptoms and general obstetric precautions including but not limited to vaginal bleeding, contractions, leaking of fluid and fetal movement were reviewed in detail with the patient. I discussed the assessment and treatment plan with the patient. The patient was provided an opportunity to ask questions and all were answered. The patient agreed with the plan and demonstrated an understanding of the instructions. The patient was advised to call back or seek an in-person office evaluation/go to [facility] at [facility] for any urgent or concerning symptoms. Please refer to After Visit Summary for other counseling recommendations.   I provided 8 minutes of face-to-face time during this encounter.  Return in 3 weeks (on 05/09/2022) for 28 wk labs, [place].  Future Appointments  Date Time Provider [place]  05/08/2022  9:30 AM [facility] [place]  05/16/2022  8:30 AM [facility] LAB [facility]  05/16/2022  9:35 AM Donnamae Jude, MD [facility]  06/13/2022 11:15 AM Donnamae Jude, MD [facility]  06/27/2022 10:35 AM Donnamae Jude, MD [facility]  07/11/2022 10:35 AM Donnamae Jude, MD [facility]     Donnamae Jude, MD [facility] [place]

## 2022-04-18 NOTE — Progress Notes (Signed)
ROB [redacted]w[redacted]d  CC: None   Pt not able to check B/P at this time.

## 2022-05-01 ENCOUNTER — Other Ambulatory Visit: Payer: Self-pay

## 2022-05-01 DIAGNOSIS — O10919 Unspecified pre-existing hypertension complicating pregnancy, unspecified trimester: Secondary | ICD-10-CM

## 2022-05-08 ENCOUNTER — Ambulatory Visit: Payer: 59 | Attending: Obstetrics and Gynecology

## 2022-05-08 ENCOUNTER — Other Ambulatory Visit: Payer: Self-pay

## 2022-05-08 VITALS — BP 135/71 | HR 78 | Temp 98.1°F | Ht 64.0 in | Wt 190.5 lb

## 2022-05-08 DIAGNOSIS — O10013 Pre-existing essential hypertension complicating pregnancy, third trimester: Secondary | ICD-10-CM

## 2022-05-08 DIAGNOSIS — Z3A28 28 weeks gestation of pregnancy: Secondary | ICD-10-CM | POA: Diagnosis not present

## 2022-05-08 DIAGNOSIS — O10919 Unspecified pre-existing hypertension complicating pregnancy, unspecified trimester: Secondary | ICD-10-CM

## 2022-05-08 DIAGNOSIS — O0993 Supervision of high risk pregnancy, unspecified, third trimester: Secondary | ICD-10-CM

## 2022-05-16 ENCOUNTER — Other Ambulatory Visit: Payer: 59

## 2022-05-16 ENCOUNTER — Ambulatory Visit (INDEPENDENT_AMBULATORY_CARE_PROVIDER_SITE_OTHER): Payer: 59 | Admitting: Family Medicine

## 2022-05-16 VITALS — BP 127/81 | HR 80 | Wt 190.0 lb

## 2022-05-16 DIAGNOSIS — O0992 Supervision of high risk pregnancy, unspecified, second trimester: Secondary | ICD-10-CM

## 2022-05-16 DIAGNOSIS — Z3A29 29 weeks gestation of pregnancy: Secondary | ICD-10-CM

## 2022-05-16 DIAGNOSIS — O10919 Unspecified pre-existing hypertension complicating pregnancy, unspecified trimester: Secondary | ICD-10-CM

## 2022-05-16 DIAGNOSIS — Z23 Encounter for immunization: Secondary | ICD-10-CM

## 2022-05-16 NOTE — Progress Notes (Signed)
ROB [redacted]w[redacted]d  Tdap offered : Desires Flu vaccine offered pt wants to wait [place].  CC: pelvic pain, lighting crouch, back pain.

## 2022-05-16 NOTE — Progress Notes (Signed)
    PRENATAL VISIT NOTE  Subjective:  Cynthia Crawford is a 31 y.o. G4P3003 at [redacted]w[redacted]d being seen today for ongoing prenatal care.  She is currently monitored for the following issues for this high-risk pregnancy and has Benign essential hypertension; Allergic asthma; Supervision of high-risk pregnancy; Preexisting hypertension complicating pregnancy, antepartum; Prediabetes; and Subluxation of patellofemoral joint on their problem list.  Patient reports no complaints.  Contractions: Irritability. Vag. Bleeding: None.  Movement: Present. Denies leaking of fluid.   The following portions of the patient's history were reviewed and updated as appropriate: allergies, current medications, past family history, past medical history, past social history, past surgical history and problem list.   Objective:   Vitals:   05/16/22 0854  BP: 127/81  Pulse: 80  Weight: 190 lb (86.2 kg)    Fetal Status: Fetal Heart Rate (bpm): 140 Fundal Height: 29 cm Movement: Present     General:  Alert, oriented and cooperative. Patient is in no acute distress.  Skin: Skin is warm and dry. No rash noted.   Cardiovascular: Normal heart rate noted  Respiratory: Normal respiratory effort, no problems with respiration noted  Abdomen: Soft, gravid, appropriate for gestational age.  Pain/Pressure: Absent     Pelvic: Cervical exam deferred        Extremities: Normal range of motion.  Edema: Moderate pitting, indentation subsides rapidly  Mental Status: Normal mood and affect. Normal behavior. Normal judgment and thought content.   Assessment and Plan:  Pregnancy: G4P3003 at [redacted]w[redacted]d 1. Supervision of high risk pregnancy in second trimester 28 week labs and TDaP today - Glucose Tolerance, 2 Hours w/1 Hour - CBC - RPR - HIV Antibody (routine testing w rflx)  2. Preexisting hypertension complicating pregnancy, antepartum BP is well controlled on Labetalol Continue ASA Last u/s for growth was at 26%--needs  weekly BPP at 32 weeks.  Preterm labor symptoms and general obstetric precautions including but not limited to vaginal bleeding, contractions, leaking of fluid and fetal movement were reviewed in detail with the patient. Please refer to After Visit Summary for other counseling recommendations.   Return in 2 weeks (on 05/30/2022) for OB visit and BPP weekly starting in 3 weeks please.  Future Appointments  Date Time Provider Department Center  05/16/2022  9:35 AM Reva Bores, MD [facility]  06/07/2022  9:00 AM [facility]  06/13/2022 11:15 AM Reva Bores, MD [facility]  06/14/2022  9:00 AM [facility] US1 [facility]  06/21/2022  9:00 AM [facility]  06/27/2022 10:35 AM Reva Bores, MD [facility]  07/11/2022 10:35 AM Reva Bores, MD [facility]    Reva Bores, MD

## 2022-05-16 NOTE — Addendum Note (Signed)
Addended by: Kennon Portela on: 05/16/2022 09:22 AM   Modules accepted: Orders

## 2022-05-17 LAB — CBC
Hematocrit: 33 % — ABNORMAL LOW (ref 34.0–46.6)
Hemoglobin: 10.9 g/dL — ABNORMAL LOW (ref 11.1–15.9)
MCH: 29.9 pg (ref 26.6–33.0)
MCHC: 33 g/dL (ref 31.5–35.7)
MCV: 91 fL (ref 79–97)
Platelets: 159 10*3/uL (ref 150–450)
RBC: 3.64 x10E6/uL — ABNORMAL LOW (ref 3.77–5.28)
RDW: 12.3 % (ref 11.7–15.4)
WBC: 10.6 10*3/uL (ref 3.4–10.8)

## 2022-05-17 LAB — GLUCOSE TOLERANCE, 2 HOURS W/ 1HR
Glucose, 1 hour: 137 mg/dL (ref 70–179)
Glucose, 2 hour: 80 mg/dL (ref 70–152)
Glucose, Fasting: 76 mg/dL (ref 70–91)

## 2022-05-17 LAB — RPR: RPR Ser Ql: NONREACTIVE

## 2022-05-17 LAB — HIV ANTIBODY (ROUTINE TESTING W REFLEX): HIV Screen 4th Generation wRfx: NONREACTIVE

## 2022-05-18 ENCOUNTER — Encounter: Payer: Self-pay | Admitting: Family Medicine

## 2022-05-22 ENCOUNTER — Other Ambulatory Visit: Payer: Self-pay | Admitting: *Deleted

## 2022-05-22 MED ORDER — FERROUS SULFATE 325 (65 FE) MG PO TABS: 325.0000 mg | ORAL_TABLET | ORAL | 3 refills | Status: DC

## 2022-06-06 ENCOUNTER — Ambulatory Visit (INDEPENDENT_AMBULATORY_CARE_PROVIDER_SITE_OTHER): Payer: 59 | Admitting: *Deleted

## 2022-06-06 ENCOUNTER — Ambulatory Visit (INDEPENDENT_AMBULATORY_CARE_PROVIDER_SITE_OTHER): Payer: 59

## 2022-06-06 VITALS — BP 131/79 | HR 85 | Wt 196.0 lb

## 2022-06-06 DIAGNOSIS — O0992 Supervision of high risk pregnancy, unspecified, second trimester: Secondary | ICD-10-CM

## 2022-06-06 DIAGNOSIS — Z3A32 32 weeks gestation of pregnancy: Secondary | ICD-10-CM

## 2022-06-06 DIAGNOSIS — O10919 Unspecified pre-existing hypertension complicating pregnancy, unspecified trimester: Secondary | ICD-10-CM

## 2022-06-06 DIAGNOSIS — O10013 Pre-existing essential hypertension complicating pregnancy, third trimester: Secondary | ICD-10-CM

## 2022-06-06 NOTE — Progress Notes (Cosign Needed Addendum)
Patient informed that the ultrasound is considered a limited obstetric ultrasound and is not intended to be a complete ultrasound exam.  Patient also informed that the ultrasound is not being completed with the intent of assessing for fetal or placental anomalies or any pelvic abnormalities. Explained that the purpose of today's ultrasound is to assess for fetal well being.  Patient acknowledges the purpose of the exam and the limitations of the study.      Oprah Camarena, RN      

## 2022-06-07 ENCOUNTER — Other Ambulatory Visit: Payer: 59

## 2022-06-13 ENCOUNTER — Ambulatory Visit (INDEPENDENT_AMBULATORY_CARE_PROVIDER_SITE_OTHER): Payer: 59

## 2022-06-13 ENCOUNTER — Ambulatory Visit (INDEPENDENT_AMBULATORY_CARE_PROVIDER_SITE_OTHER): Payer: 59 | Admitting: *Deleted

## 2022-06-13 ENCOUNTER — Ambulatory Visit (INDEPENDENT_AMBULATORY_CARE_PROVIDER_SITE_OTHER): Payer: 59 | Admitting: Family Medicine

## 2022-06-13 VITALS — BP 115/72 | HR 83 | Wt 197.0 lb

## 2022-06-13 DIAGNOSIS — O0993 Supervision of high risk pregnancy, unspecified, third trimester: Secondary | ICD-10-CM

## 2022-06-13 DIAGNOSIS — O10913 Unspecified pre-existing hypertension complicating pregnancy, third trimester: Secondary | ICD-10-CM

## 2022-06-13 DIAGNOSIS — R102 Pelvic and perineal pain: Secondary | ICD-10-CM

## 2022-06-13 DIAGNOSIS — O26893 Other specified pregnancy related conditions, third trimester: Secondary | ICD-10-CM

## 2022-06-13 DIAGNOSIS — O0992 Supervision of high risk pregnancy, unspecified, second trimester: Secondary | ICD-10-CM

## 2022-06-13 DIAGNOSIS — O10919 Unspecified pre-existing hypertension complicating pregnancy, unspecified trimester: Secondary | ICD-10-CM

## 2022-06-13 DIAGNOSIS — Z3A33 33 weeks gestation of pregnancy: Secondary | ICD-10-CM

## 2022-06-13 NOTE — Progress Notes (Signed)
   PRENATAL VISIT NOTE  Subjective:  Cynthia Crawford is a 31 y.o. G4P3003 at [redacted]w[redacted]d being seen today for ongoing prenatal care.  She is currently monitored for the following issues for this high-risk pregnancy and has Benign essential hypertension; Allergic asthma; Supervision of high-risk pregnancy; Preexisting hypertension complicating pregnancy, antepartum; Prediabetes; and Subluxation of patellofemoral joint on their problem list.  Patient reports  pelvic pain and pressure .  Contractions: Irritability. Vag. Bleeding: None.  Movement: Present. Denies leaking of fluid.   The following portions of the patient's history were reviewed and updated as appropriate: allergies, current medications, past family history, past medical history, past social history, past surgical history and problem list.   Objective:   Vitals:   06/13/22 1002  BP: 115/72  Pulse: 83  Weight: 197 lb (89.4 kg)    Fetal Status: Fetal Heart Rate (bpm): NST   Movement: Present  Presentation: Vertex  General:  Alert, oriented and cooperative. Patient is in no acute distress.  Skin: Skin is warm and dry. No rash noted.   Cardiovascular: Normal heart rate noted  Respiratory: Normal respiratory effort, no problems with respiration noted  Abdomen: Soft, gravid, appropriate for gestational age.  Pain/Pressure: Present     Pelvic: Cervical exam deferred        Extremities: Normal range of motion.     Mental Status: Normal mood and affect. Normal behavior. Normal judgment and thought content.   Assessment and Plan:  Pregnancy: G4P3003 at [redacted]w[redacted]d 1. Preexisting hypertension complicating pregnancy, antepartum On Labetalol and ASA Last growth at 26% NST:  Baseline: 130 bpm, Variability: Good {> 6 bpm), Accelerations: Reactive, and Decelerations: Absent BPP 8/8 today   2. Supervision of high risk pregnancy in third trimester   3. Pelvic pain affecting pregnancy in third trimester, antepartum Refer to PT--has  maternity belt and isn't bothered during the day-- - Ambulatory referral to Physical Therapy  Preterm labor symptoms and general obstetric precautions including but not limited to vaginal bleeding, contractions, leaking of fluid and fetal movement were reviewed in detail with the patient. Please refer to After Visit Summary for other counseling recommendations.   Return in 1 week (on 06/20/2022) for [facility].  Future Appointments  Date Time Provider [place]  06/13/2022 11:15 AM Donnamae Jude, MD [facility]  06/20/2022  9:15 AM [facility] NST [facility]  06/27/2022  9:15 AM [facility] NST [facility]  06/27/2022 10:35 AM Donnamae Jude, MD [facility]  07/04/2022  9:15 AM [facility] NST [facility]  07/11/2022  9:15 AM [facility] NST [facility]  07/11/2022 10:35 AM Donnamae Jude, MD [facility]  07/19/2022 11:15 AM Darlina Rumpf, CNM [facility]  07/25/2022 10:35 AM Donnamae Jude, MD [facility]  08/02/2022 10:35 AM Darlina Rumpf, CNM [facility]  08/08/2022 11:15 AM Donnamae Jude, MD [facility]    Donnamae Jude, MD

## 2022-06-13 NOTE — Progress Notes (Signed)
Patient informed that the ultrasound is considered a limited obstetric ultrasound and is not intended to be a complete ultrasound exam.  Patient also informed that the ultrasound is not being completed with the intent of assessing for fetal or placental anomalies or any pelvic abnormalities. Explained that the purpose of today's ultrasound is to assess for fetal well being.  Patient acknowledges the purpose of the exam and the limitations of the study.      Tawanna Funk, RN      

## 2022-06-14 ENCOUNTER — Other Ambulatory Visit: Payer: 59

## 2022-06-15 DIAGNOSIS — Z3483 Encounter for supervision of other normal pregnancy, third trimester: Secondary | ICD-10-CM | POA: Diagnosis not present

## 2022-06-15 DIAGNOSIS — Z3482 Encounter for supervision of other normal pregnancy, second trimester: Secondary | ICD-10-CM | POA: Diagnosis not present

## 2022-06-20 ENCOUNTER — Other Ambulatory Visit: Payer: Self-pay | Admitting: Obstetrics and Gynecology

## 2022-06-20 ENCOUNTER — Ambulatory Visit (INDEPENDENT_AMBULATORY_CARE_PROVIDER_SITE_OTHER): Payer: 59 | Admitting: *Deleted

## 2022-06-20 ENCOUNTER — Ambulatory Visit (INDEPENDENT_AMBULATORY_CARE_PROVIDER_SITE_OTHER): Payer: 59

## 2022-06-20 VITALS — BP 117/74 | HR 85 | Wt 197.0 lb

## 2022-06-20 DIAGNOSIS — Z363 Encounter for antenatal screening for malformations: Secondary | ICD-10-CM

## 2022-06-20 DIAGNOSIS — O10919 Unspecified pre-existing hypertension complicating pregnancy, unspecified trimester: Secondary | ICD-10-CM

## 2022-06-20 NOTE — Progress Notes (Signed)
Patient informed that the ultrasound is considered a limited obstetric ultrasound and is not intended to be a complete ultrasound exam.  Patient also informed that the ultrasound is not being completed with the intent of assessing for fetal or placental anomalies or any pelvic abnormalities. Explained that the purpose of today's ultrasound is to assess for fetal wellbeing.  Patient acknowledges the purpose of the exam and the limitations of the study.         

## 2022-06-21 ENCOUNTER — Other Ambulatory Visit: Payer: 59

## 2022-06-21 NOTE — Progress Notes (Signed)
  Patient seen and assessed by nursing staff.  Agree with documentation and plan.  NST:  Baseline: 130 bpm, Variability: Good {> 6 bpm), Accelerations: Reactive, and Decelerations: Absent  

## 2022-06-26 ENCOUNTER — Ambulatory Visit: Payer: 59 | Admitting: *Deleted

## 2022-06-26 ENCOUNTER — Ambulatory Visit: Payer: 59 | Attending: Obstetrics and Gynecology

## 2022-06-26 VITALS — BP 150/69 | HR 83

## 2022-06-26 DIAGNOSIS — O10919 Unspecified pre-existing hypertension complicating pregnancy, unspecified trimester: Secondary | ICD-10-CM | POA: Diagnosis not present

## 2022-06-26 DIAGNOSIS — Z363 Encounter for antenatal screening for malformations: Secondary | ICD-10-CM

## 2022-06-26 DIAGNOSIS — O0993 Supervision of high risk pregnancy, unspecified, third trimester: Secondary | ICD-10-CM | POA: Insufficient documentation

## 2022-06-26 DIAGNOSIS — Z3A35 35 weeks gestation of pregnancy: Secondary | ICD-10-CM

## 2022-06-26 DIAGNOSIS — O10013 Pre-existing essential hypertension complicating pregnancy, third trimester: Secondary | ICD-10-CM

## 2022-06-27 ENCOUNTER — Other Ambulatory Visit: Payer: 59

## 2022-06-27 ENCOUNTER — Encounter: Payer: Self-pay | Admitting: Family Medicine

## 2022-06-27 ENCOUNTER — Telehealth (INDEPENDENT_AMBULATORY_CARE_PROVIDER_SITE_OTHER): Payer: 59 | Admitting: Family Medicine

## 2022-06-27 DIAGNOSIS — O10913 Unspecified pre-existing hypertension complicating pregnancy, third trimester: Secondary | ICD-10-CM

## 2022-06-27 DIAGNOSIS — O10919 Unspecified pre-existing hypertension complicating pregnancy, unspecified trimester: Secondary | ICD-10-CM

## 2022-06-27 DIAGNOSIS — Z3A35 35 weeks gestation of pregnancy: Secondary | ICD-10-CM

## 2022-06-27 DIAGNOSIS — O0993 Supervision of high risk pregnancy, unspecified, third trimester: Secondary | ICD-10-CM

## 2022-06-27 NOTE — Progress Notes (Signed)
ROB [redacted]w[redacted]d  Pt had growth U/S  and BPP yesterday.  CC: Swelling in feet.

## 2022-06-27 NOTE — Progress Notes (Signed)
OBSTETRICS PRENATAL VIRTUAL VISIT ENCOUNTER NOTE  Provider location: [facility] [place] [facility]   Patient location: Home  I connected with Cynthia Crawford on 06/27/22 at 10:55 AM EDT by MyChart Video Encounter and verified that I am speaking with the correct person using two identifiers. I discussed the limitations, risks, security and privacy concerns of performing an evaluation and management service virtually and the availability of in person appointments. I also discussed with the patient that there may be a patient responsible charge related to this service. The patient expressed understanding and agreed to proceed. Subjective:  Cynthia Crawford is a 31 y.o. G4P3003 at [redacted]w[redacted]d being seen today for ongoing prenatal care.  She is currently monitored for the following issues for this high-risk pregnancy and has Benign essential hypertension; Allergic asthma; Supervision of high-risk pregnancy; Preexisting hypertension complicating pregnancy, antepartum; Prediabetes; and Subluxation of patellofemoral joint on their problem list.  Patient reports no complaints.  Contractions: Irritability. Vag. Bleeding: None.  Movement: Present. Denies any leaking of fluid.   The following portions of the patient's history were reviewed and updated as appropriate: allergies, current medications, past family history, past medical history, past social history, past surgical history and problem list.   Objective:  There were no vitals filed for this visit.  Fetal Status:     Movement: Present     General:  Alert, oriented and cooperative. Patient is in no acute distress.  Respiratory: Normal respiratory effort, no problems with respiration noted  Mental Status: Normal mood and affect. Normal behavior. Normal judgment and thought content.  Rest of physical exam deferred due to type of encounter  Imaging: [place] MFM FETAL BPP WO NON STRESS  Result Date:  06/26/2022 ----------------------------------------------------------------------  OBSTETRICS REPORT                       (Signed Final 06/26/2022 10:06 am) ---------------------------------------------------------------------- Patient Info  ID #:       [id number]                          D.O.B.:  09/30/1990 (31 yrs)  Name:       Cynthia Crawford                Visit Date: 06/26/2022 09:54 am              [place] ---------------------------------------------------------------------- Performed By  Attending:        Valeda Malm DO       Secondary Phy.:   Osborne Oman MD  Performed By:     Eveline Keto         Address:          [place]  [place], [place]  Referred By:      [place]       [facility]         [facility]                                                             [facility]                                                             [facility]  Ref. Address:     [place] ---------------------------------------------------------------------- Orders  #  Description                           Code        Ordered By  1  [place] MFM FETAL BPP WO NON               [id number]    RAVI SHANKAR     STRESS  2  [place] MFM OB FOLLOW UP                   [id number]    RAVI [facility] ----------------------------------------------------------------------  #  Order #                     Accession #                Episode #  1  [id number]                   [id number]                 [id number]  2  [id number]                   [id number]                 [id number] ---------------------------------------------------------------------- Indications  [redacted] weeks gestation of pregnancy                Z3A.35  Pre-existing  essential hypertension            123XX123  complicating pregnancy, third trimester  AFP:Neg ---------------------------------------------------------------------- Vital Signs  BP:  136/70 ---------------------------------------------------------------------- Fetal Evaluation  Num Of Fetuses:         1  Fetal Heart Rate(bpm):  137  Cardiac Activity:       Observed  Presentation:           Cephalic  Placenta:               Posterior  P. Cord Insertion:      Previously Visualized  Amniotic Fluid  AFI FV:      Within normal limits  AFI Sum(cm)     %Tile       Largest Pocket(cm)  16.8            62          6.2  RUQ(cm)       RLQ(cm)       LUQ(cm)        LLQ(cm)  3.4           2.2           5              6.2 ---------------------------------------------------------------------- Biophysical Evaluation  Amniotic F.V:   Within normal limits       F. Tone:        Observed  F. Movement:    Observed                   Score:          8/8  F. Breathing:   Observed ---------------------------------------------------------------------- Biometry  BPD:        84  mm     G. Age:  33w 6d         15  %    CI:        76.71   %    70 - 86                                                          FL/HC:      20.9   %    20.1 - 22.3  HC:      303.8  mm     G. Age:  33w 5d        2.3  %    HC/AC:      0.99        0.93 - 1.11  AC:      305.8  mm     G. Age:  34w 4d         35  %    FL/BPD:     75.6   %    71 - 87  FL:       63.5  mm     G. Age:  32w 6d        2.8  %    FL/AC:      20.8   %    20 - 24  HUM:      56.7  mm     G. Age:  33w 0d         20  %  LV:        2.2  mm  Est. FW:    2305  gm      5 lb 1 oz     15  % ----------------------------------------------------------------------  OB History  Gravidity:    4         Term:   3        Prem:   0        SAB:   0  TOP:          0       Ectopic:  0        Living: 3 ---------------------------------------------------------------------- Gestational Age  LMP:           35w 2d         Date:  10/22/21                   EDD:   07/29/22  U/S Today:     33w 5d                                        EDD:   08/09/22  Best:          35w 2d     Det. By:  LMP  (10/22/21)          EDD:   07/29/22 ---------------------------------------------------------------------- Anatomy  Cranium:               Appears normal         Diaphragm:              Appears normal  Cavum:                 Appears normal         Stomach:                Appears normal, left                                                                        sided  Ventricles:            Appears normal         Kidneys:                Appear normal  Lips:                  Appears normal         Bladder:                Appears normal  Heart:                 Appears normal                         (4CH, axis, and                         situs) ---------------------------------------------------------------------- Cervix Uterus Adnexa  Cervix  Not visualized (advanced GA >24wks)  Uterus  No abnormality visualized.  Right Ovary  Not visualized.  Left Ovary  Not visualized. ---------------------------------------------------------------------- Comments  The patient is here for a BPP and growth [place] for CHTN on  labetalol. She is at 35w 2d with EDD of 07/29/2022 dated by  LMP  (10/22/21). Her BP today was  150/69 but repeat was  136/70. She denies headache or vision changes or RUQ  pain.  Sonographic findings  Single intrauterine pregnancy.  Observed fetal cardiac activity.  Cephalic presentation.  Interval fetal anatomy appears normal.  Fetal biometry shows the estimated fetal weight at the 15  percentile.  Amniotic fluid volume: Within normal limits. AFI: 16.8 cm.  MVP: 6.2 cm.  Placenta is Posterior.  BPP is 8/8.  Recommendations  1. BBPs weekly until delivery  2. Delivery around 37-38 weeks  3. Declined aneuploidy screening ----------------------------------------------------------------------                   Valeda Malm, DO Electronically Signed  Final Report   06/26/2022 10:06 am ----------------------------------------------------------------------  [place] MFM OB FOLLOW UP  Result Date: 06/26/2022 ----------------------------------------------------------------------  OBSTETRICS REPORT                       (Signed Final 06/26/2022 10:06 am) ---------------------------------------------------------------------- Patient Info  ID #:       [id number]                          D.O.B.:  [birth year] (31 yrs)  Name:       Cynthia Crawford                Visit Date: 06/26/2022 09:54 am              [place] ---------------------------------------------------------------------- Performed By  Attending:        Valeda Malm DO       Secondary Phy.:   Osborne Oman MD  Performed By:     Eveline Keto         Address:          [place], [place]  Referred By:      [place]       Location:         [facility]                                                             [facility] for                                                             Women  Ref. Address:     [place] ---------------------------------------------------------------------- Orders  #  Description                           Code        Ordered By  1  [place] MFM FETAL BPP WO NON               76819.01    RAVI SHANKAR     STRESS  2  [place] MFM OB FOLLOW UP                   [id number]    RAVI SHANKAR ----------------------------------------------------------------------  #  Order #                     Accession #                Episode #  1  [id number]                   [id number]                 [id number]  2  [id number]                   [id number]                 [id number]  ---------------------------------------------------------------------- Indications  [redacted] weeks gestation of pregnancy                Z3A.35  Pre-existing essential hypertension            123XX123  complicating pregnancy, third trimester  AFP:Neg ---------------------------------------------------------------------- Vital Signs  BP:          136/70 ---------------------------------------------------------------------- Fetal Evaluation  Num Of Fetuses:         1  Fetal Heart Rate(bpm):  137  Cardiac Activity:       Observed  Presentation:           Cephalic  Placenta:               Posterior  P. Cord Insertion:      Previously Visualized  Amniotic Fluid  AFI FV:      Within normal limits  AFI Sum(cm)     %Tile       Largest Pocket(cm)  16.8            62          6.2  RUQ(cm)       RLQ(cm)       LUQ(cm)  LLQ(cm)  3.4           2.2           5              6.2 ---------------------------------------------------------------------- Biophysical Evaluation  Amniotic F.V:   Within normal limits       F. Tone:        Observed  F. Movement:    Observed                   Score:          8/8  F. Breathing:   Observed ---------------------------------------------------------------------- Biometry  BPD:        84  mm     G. Age:  33w 6d         15  %    CI:        76.71   %    70 - 86                                                          FL/HC:      20.9   %    20.1 - 22.3  HC:      303.8  mm     G. Age:  33w 5d        2.3  %    HC/AC:      0.99        0.93 - 1.11  AC:      305.8  mm     G. Age:  34w 4d         35  %    FL/BPD:     75.6   %    71 - 87  FL:       63.5  mm     G. Age:  32w 6d        2.8  %    FL/AC:      20.8   %    20 - 24  HUM:      56.7  mm     G. Age:  33w 0d         20  %  LV:        2.2  mm  Est. FW:    2305  gm      5 lb 1 oz     15  % ---------------------------------------------------------------------- OB History  Gravidity:    4         Term:   3        Prem:   0        SAB:   0  TOP:          0        Ectopic:  0        Living: 3 ---------------------------------------------------------------------- Gestational Age  LMP:           35w 2d        Date:  10/22/21                   EDD:   07/29/22  U/S Today:     33w 5d  EDD:   08/09/22  Best:          Barbie Haggis 2d     Det. By:  LMP  (10/22/21)          EDD:   07/29/22 ---------------------------------------------------------------------- Anatomy  Cranium:               Appears normal         Diaphragm:              Appears normal  Cavum:                 Appears normal         Stomach:                Appears normal, left                                                                        sided  Ventricles:            Appears normal         Kidneys:                Appear normal  Lips:                  Appears normal         Bladder:                Appears normal  Heart:                 Appears normal                         (4CH, axis, and                         situs) ---------------------------------------------------------------------- Cervix Uterus Adnexa  Cervix  Not visualized (advanced GA >24wks)  Uterus  No abnormality visualized.  Right Ovary  Not visualized.  Left Ovary  Not visualized. ---------------------------------------------------------------------- Comments  The patient is here for a BPP and growth [place] for CHTN on  labetalol. She is at 35w 2d with EDD of 07/29/2022 dated by  LMP  (10/22/21). Her BP today was 150/69 but repeat was  136/70. She denies headache or vision changes or RUQ  pain.  Sonographic findings  Single intrauterine pregnancy.  Observed fetal cardiac activity.  Cephalic presentation.  Interval fetal anatomy appears normal.  Fetal biometry shows the estimated fetal weight at the 15  percentile.  Amniotic fluid volume: Within normal limits. AFI: 16.8 cm.  MVP: 6.2 cm.  Placenta is Posterior.  BPP is 8/8.  Recommendations  1. BBPs weekly until delivery  2. Delivery around 37-38 weeks  3. Declined  aneuploidy screening ----------------------------------------------------------------------                   Valeda Malm, DO Electronically Signed Final Report   06/26/2022 10:06 am ----------------------------------------------------------------------  US FETAL BPP W/NONSTRESS  Result Date: 06/21/2022 ----------------------------------------------------------------------  OBSTETRICS REPORT                       (Signed Final 06/21/2022 07:57 am) ---------------------------------------------------------------------- Patient Info  ID #:  [id number]                          D.O.B.:  20-Dec-1990 (31 yrs)  Name:       Cynthia Crawford                Visit Date: 06/20/2022 10:12 am              [place] ---------------------------------------------------------------------- Performed By  Attending:        Darron Doom MD         Secondary Phy.:   Osborne Oman MD  Performed By:     Crosby Oyster RN     Address:          [address]                                                             [place], [place]  Referred By:      Jamie Brookes       Location:         [facility]                                                             [facility]  Ref. Address:     [place] ---------------------------------------------------------------------- Orders  #  Description                           Code        Ordered By  1  US FETAL BPP W/NONSTRESS              [id number]     Darron Doom ----------------------------------------------------------------------  #  Order #                     Accession #                Episode #  1  [id number]                   [id number]                 [id number]  ---------------------------------------------------------------------- Indications  [redacted] weeks gestation of pregnancy                Z3A.34  Pre-existing essential hypertension            123XX123  complicating pregnancy, third trimester ---------------------------------------------------------------------- Fetal Evaluation  Num Of Fetuses:         1  Cardiac Activity:       Observed  Presentation:           Cephalic  AFI Sum(cm)     %Tile       Largest Pocket(cm)  16.84           61          6.18  RUQ(cm)       RLQ(cm)       LUQ(cm)        LLQ(cm)  3.96          6.18          5.61           1.09 ---------------------------------------------------------------------- Biophysical Evaluation  Amniotic F.V:   Within normal limits       F. Tone:        Observed  F. Movement:    Observed                   N.S.T:          Reactive  F. Breathing:   Observed                   Score:          10/10 ---------------------------------------------------------------------- OB History  Gravidity:    4         Term:   3        Prem:   0        SAB:   0  TOP:          0       Ectopic:  0        Living: 3 ---------------------------------------------------------------------- Gestational Age  LMP:           34w 3d        Date:  10/22/21                   EDD:   07/29/22  Best:          34w 3d     Det. By:  LMP  (10/22/21)          EDD:   07/29/22 ---------------------------------------------------------------------- Impression  IUP @ 34 3/7 weeks  CHTN on meds  BPP 8/8 ---------------------------------------------------------------------- Recommendations  Continue weekly antenatal testing till delivery . ----------------------------------------------------------------------                  Darron Doom, MD Electronically Signed Final Report  06/21/2022 07:57 am ----------------------------------------------------------------------  US FETAL BPP W/NONSTRESS  Result Date:  06/13/2022 ----------------------------------------------------------------------  OBSTETRICS REPORT                       (Signed Final 06/13/2022 01:25 pm) ---------------------------------------------------------------------- Patient Info  ID #:       [id number]                          D.O.B.:  Mar 21, 1991 (31 yrs)  Name:       Cynthia Crawford                Visit Date: 06/13/2022 10:56 am              [place] ---------------------------------------------------------------------- Performed By  Attending:        Darron Doom MD         Secondary Phy.:   Osborne Oman MD  Performed By:     Crosby Oyster RN     Address:          [address]                                                             [place], [place]  Referred By:      Jamie Brookes       Location:         [facility]                                                             [facility]  Ref. Address:     [address] ---------------------------------------------------------------------- Orders  #  Description                           Code        Ordered By  1  US FETAL BPP W/NONSTRESS              91694.5     Tinnie Gens ----------------------------------------------------------------------  #  Order #                     Accession #                Episode #  1  [id number]                   [id number]                 [id number] ---------------------------------------------------------------------- Indications  [redacted] weeks gestation of pregnancy                Z3A.33  Pre-existing essential hypertension            O10.013  complicating pregnancy, third trimester ---------------------------------------------------------------------- Fetal Evaluation  Num Of  Fetuses:         1  Cardiac Activity:       Observed  Presentation:           Cephalic  AFI Sum(cm)     %Tile       Largest Pocket(cm)  18.21           67          9.35  RUQ(cm)       RLQ(cm)       LUQ(cm)        LLQ(cm)  9.35          3.24          4.31           1.31 ---------------------------------------------------------------------- Biophysical Evaluation  Amniotic F.V:   Within normal limits       F. Tone:        Observed  F. Movement:    Observed                   N.S.T:          Reactive  F. Breathing:   Observed                   Score:          10/10 ---------------------------------------------------------------------- OB History  Gravidity:    4         Term:   3        Prem:   0        SAB:   0  TOP:          0       Ectopic:  0        Living: 3 ---------------------------------------------------------------------- Gestational Age  LMP:           33w 3d        Date:  10/22/21                   EDD:   07/29/22  Best:          33w 3d     Det. By:  LMP  (10/22/21)          EDD:   07/29/22 ---------------------------------------------------------------------- Impression  IUP @ 33 3/7 weeks  CHTN on labetalol  BPP 8/8 ---------------------------------------------------------------------- Recommendations  Continue weekly antenatal testing till delivery . ----------------------------------------------------------------------                  Darron Doom, MD Electronically Signed Final Report   06/13/2022 01:25 pm ----------------------------------------------------------------------  US FETAL BPP W/NONSTRESS  Result Date: 06/07/2022 ----------------------------------------------------------------------  OBSTETRICS REPORT                       (Signed Final 06/07/2022 01:51 pm) ---------------------------------------------------------------------- Patient Info  ID #:       [id number]                          D.O.B.:  [birth year] (31 yrs)  Name:       Cynthia Crawford                Visit Date: 06/06/2022 10:15 am               [place] ---------------------------------------------------------------------- Performed By  Attending:        Aletha Halim MD     Secondary Phy.:   Osborne Oman MD  Performed By:     Crosby Oyster RN     Address:          [address]                                                             [place], [place]  Referred By:      Jamie Brookes       Location:         [facility]                                                             [facility]	[place]  [facility]  Ref. Address:     [place] ---------------------------------------------------------------------- Orders  #  Description                           Code        Ordered By  1  US FETAL BPP W/NONSTRESS              [phone]     Aletha Halim ----------------------------------------------------------------------  #  Order #                     Accession #                Episode #  1  [id number]                   [id number]                 [id number] ---------------------------------------------------------------------- Indications  [redacted] weeks gestation of pregnancy                Z3A.32  Pre-existing essential hypertension            123XX123  complicating pregnancy, third trimester ---------------------------------------------------------------------- Fetal Evaluation  Num Of Fetuses:         1  Cardiac Activity:       Observed  Presentation:           Cephalic  AFI Sum(cm)     %Tile       Largest Pocket(cm)  19.96           75          6.64  RUQ(cm)       RLQ(cm)       LUQ(cm)        LLQ(cm)  4.07          4.68          4.57           6.64 ---------------------------------------------------------------------- Biophysical Evaluation  Amniotic F.V:   Within normal  limits       F. Tone:        Observed  F. Movement:    Observed                   N.S.T:          Reactive  F. Breathing:   Observed                   Score:          10/10 ---------------------------------------------------------------------- OB History  Gravidity:    4         Term:   3        Prem:   0        SAB:   0  TOP:          0       Ectopic:  0        Living: 3 ---------------------------------------------------------------------- Gestational Age  LMP:           32w 3d        Date:  10/22/21                   EDD:   07/29/22  Best:          Milderd Meager 3d  Det. By:  LMP  (10/22/21)          EDD:   07/29/22 ---------------------------------------------------------------------- Impression  Reassuring antenatal testing ---------------------------------------------------------------------- Recommendations  Continue weekly testing. ----------------------------------------------------------------------                 Aletha Halim, MD Electronically Signed Final Report   06/07/2022 01:51 pm ----------------------------------------------------------------------   Assessment and Plan:  Pregnancy: [id number] at [redacted]w[redacted]d 1. Supervision of high risk pregnancy in third trimester   2. Preexisting hypertension complicating pregnancy, antepartum Last growth at 15% on meds--BP is stable on meds On ASA In testing  Delivery planned for 37-38 weeks per MFM IOL scheduled and orders placed  Preterm labor symptoms and general obstetric precautions including but not limited to vaginal bleeding, contractions, leaking of fluid and fetal movement were reviewed in detail with the patient. I discussed the assessment and treatment plan with the patient. The patient was provided an opportunity to ask questions and all were answered. The patient agreed with the plan and demonstrated an understanding of the instructions. The patient was advised to call back or seek an in-person office evaluation/go to [facility] at [facility] for any urgent or concerning symptoms. Please refer to After Visit Summary for other counseling recommendations.   I provided 8 minutes of face-to-face time during this encounter.  Return in 1 week (on 07/04/2022).  Future Appointments  Date Time Provider [place]  07/04/2022  9:15 AM [facility] NST [facility]  07/11/2022  9:15 AM [facility] NST [facility]  07/11/2022 10:35 AM Donnamae Jude, MD [facility]  07/14/2022 12:00 AM [facility] [place] None    Donnamae Jude, MD [facility], [place]

## 2022-07-04 ENCOUNTER — Other Ambulatory Visit (HOSPITAL_COMMUNITY)
Admission: RE | Admit: 2022-07-04 | Discharge: 2022-07-04 | Disposition: A | Discharge status: Home / Self Care | Payer: 59 | Source: Ambulatory Visit | Attending: Family Medicine | Admitting: Family Medicine

## 2022-07-04 ENCOUNTER — Ambulatory Visit (INDEPENDENT_AMBULATORY_CARE_PROVIDER_SITE_OTHER): Payer: 59

## 2022-07-04 ENCOUNTER — Ambulatory Visit (INDEPENDENT_AMBULATORY_CARE_PROVIDER_SITE_OTHER): Payer: 59 | Admitting: *Deleted

## 2022-07-04 VITALS — BP 126/77 | HR 85 | Wt 199.0 lb

## 2022-07-04 DIAGNOSIS — O10919 Unspecified pre-existing hypertension complicating pregnancy, unspecified trimester: Secondary | ICD-10-CM

## 2022-07-04 DIAGNOSIS — O0993 Supervision of high risk pregnancy, unspecified, third trimester: Secondary | ICD-10-CM

## 2022-07-04 DIAGNOSIS — I10 Essential (primary) hypertension: Secondary | ICD-10-CM | POA: Diagnosis not present

## 2022-07-04 DIAGNOSIS — Z23 Encounter for immunization: Secondary | ICD-10-CM | POA: Diagnosis not present

## 2022-07-04 NOTE — Progress Notes (Signed)
Patient informed that the ultrasound is considered a limited obstetric ultrasound and is not intended to be a complete ultrasound exam.  Patient also informed that the ultrasound is not being completed with the intent of assessing for fetal or placental anomalies or any pelvic abnormalities. Explained that the purpose of today's ultrasound is to assess for fetal wellbeing.  Patient acknowledges the purpose of the exam and the limitations of the study.     GBS and GC/CT swabs collected today Flu shot given. Pt tolerated well.   Crosby Oyster, RN

## 2022-07-05 LAB — CERVICOVAGINAL ANCILLARY ONLY
Chlamydia: NEGATIVE
Comment: NEGATIVE
Comment: NORMAL
Neisseria Gonorrhea: NEGATIVE

## 2022-07-06 ENCOUNTER — Telehealth (HOSPITAL_COMMUNITY): Payer: Self-pay | Admitting: *Deleted

## 2022-07-06 ENCOUNTER — Encounter (HOSPITAL_COMMUNITY): Payer: Self-pay | Admitting: *Deleted

## 2022-07-06 NOTE — Telephone Encounter (Signed)
Preadmission screen  

## 2022-07-08 LAB — STREP GP B CULTURE+RFLX: Strep Gp B Culture+Rflx: NEGATIVE

## 2022-07-11 ENCOUNTER — Other Ambulatory Visit: Payer: Self-pay | Admitting: Advanced Practice Midwife

## 2022-07-11 ENCOUNTER — Ambulatory Visit (INDEPENDENT_AMBULATORY_CARE_PROVIDER_SITE_OTHER): Payer: 59 | Admitting: *Deleted

## 2022-07-11 ENCOUNTER — Ambulatory Visit (INDEPENDENT_AMBULATORY_CARE_PROVIDER_SITE_OTHER): Payer: 59

## 2022-07-11 ENCOUNTER — Ambulatory Visit (INDEPENDENT_AMBULATORY_CARE_PROVIDER_SITE_OTHER): Payer: 59 | Admitting: Family Medicine

## 2022-07-11 VITALS — BP 125/77 | HR 84 | Wt 199.0 lb

## 2022-07-11 DIAGNOSIS — O10919 Unspecified pre-existing hypertension complicating pregnancy, unspecified trimester: Secondary | ICD-10-CM

## 2022-07-11 DIAGNOSIS — O0993 Supervision of high risk pregnancy, unspecified, third trimester: Secondary | ICD-10-CM

## 2022-07-11 DIAGNOSIS — O10913 Unspecified pre-existing hypertension complicating pregnancy, third trimester: Secondary | ICD-10-CM

## 2022-07-11 DIAGNOSIS — Z3A37 37 weeks gestation of pregnancy: Secondary | ICD-10-CM

## 2022-07-11 NOTE — Progress Notes (Signed)
Patient informed that the ultrasound is considered a limited obstetric ultrasound and is not intended to be a complete ultrasound exam.  Patient also informed that the ultrasound is not being completed with the intent of assessing for fetal or placental anomalies or any pelvic abnormalities. Explained that the purpose of today's ultrasound is to assess for fetal well being.  Patient acknowledges the purpose of the exam and the limitations of the study.      Catrina Fellenz, RN      

## 2022-07-11 NOTE — Progress Notes (Signed)
Patient seen and assessed by nursing staff.  Agree with documentation and plan.  NST:  Baseline: 125 bpm, Variability: Good {> 6 bpm), Accelerations: Reactive, and Decelerations: Absent

## 2022-07-11 NOTE — Progress Notes (Signed)
   PRENATAL VISIT NOTE  Subjective:  Cynthia Crawford is a 31 y.o. G4P3003 at 105w3d being seen today for ongoing prenatal care.  She is currently monitored for the following issues for this high-risk pregnancy and has Benign essential hypertension; Allergic asthma; Supervision of high-risk pregnancy; Preexisting hypertension complicating pregnancy, antepartum; Prediabetes; and Subluxation of patellofemoral joint on their problem list.  Patient reports no complaints.   .  .  Movement: Present. Denies leaking of fluid.   The following portions of the patient's history were reviewed and updated as appropriate: allergies, current medications, past family history, past medical history, past social history, past surgical history and problem list.   Objective:  There were no vitals filed for this visit.  Fetal Status: Fetal Heart Rate (bpm): NST Fundal Height: 32 cm Movement: Present  Presentation: Vertex  General:  Alert, oriented and cooperative. Patient is in no acute distress.  Skin: Skin is warm and dry. No rash noted.   Cardiovascular: Normal heart rate noted  Respiratory: Normal respiratory effort, no problems with respiration noted  Abdomen: Soft, gravid, appropriate for gestational age.        Pelvic: Cervical exam performed in the presence of a chaperone Dilation: 2.5 Effacement (%): 20 Station: -3  Extremities: Normal range of motion.     Mental Status: Normal mood and affect. Normal behavior. Normal judgment and thought content.   Assessment and Plan:  Pregnancy: G4P3003 at [redacted]w[redacted]d 1. Preexisting hypertension complicating pregnancy, antepartum On Labetalol and ASA NST:  Baseline: 130 bpm, Variability: Good {> 6 bpm), Accelerations: Reactive, and Decelerations: Absent BPP 8/8  2. Supervision of high risk pregnancy in third trimester Continue prenatal care.   Term labor symptoms and general obstetric precautions including but not limited to vaginal bleeding, contractions,  leaking of fluid and fetal movement were reviewed in detail with the patient. Please refer to After Visit Summary for other counseling recommendations.   Return in 1 week (on 07/18/2022).  Future Appointments  Date Time Provider [place]  07/14/2022 12:00 AM [facility] SCHED ROOM [facility] None    Donnamae Jude, MD

## 2022-07-14 ENCOUNTER — Inpatient Hospital Stay (HOSPITAL_COMMUNITY)
Admission: RE | Admit: 2022-07-14 | Discharge: 2022-07-15 | DRG: 807 | Disposition: A | Discharge status: Home / Self Care | Payer: 59 | Attending: Obstetrics and Gynecology | Admitting: Obstetrics and Gynecology

## 2022-07-14 ENCOUNTER — Other Ambulatory Visit: Payer: Self-pay

## 2022-07-14 ENCOUNTER — Inpatient Hospital Stay (HOSPITAL_COMMUNITY): Payer: 59 | Admitting: Anesthesiology

## 2022-07-14 ENCOUNTER — Encounter (HOSPITAL_COMMUNITY): Payer: Self-pay | Admitting: Family Medicine

## 2022-07-14 ENCOUNTER — Inpatient Hospital Stay (HOSPITAL_COMMUNITY): Payer: 59

## 2022-07-14 DIAGNOSIS — O1002 Pre-existing essential hypertension complicating childbirth: Secondary | ICD-10-CM | POA: Diagnosis not present

## 2022-07-14 DIAGNOSIS — O169 Unspecified maternal hypertension, unspecified trimester: Principal | ICD-10-CM | POA: Diagnosis present

## 2022-07-14 DIAGNOSIS — O164 Unspecified maternal hypertension, complicating childbirth: Secondary | ICD-10-CM | POA: Diagnosis not present

## 2022-07-14 DIAGNOSIS — Z23 Encounter for immunization: Secondary | ICD-10-CM | POA: Diagnosis not present

## 2022-07-14 DIAGNOSIS — Z3A37 37 weeks gestation of pregnancy: Secondary | ICD-10-CM | POA: Diagnosis not present

## 2022-07-14 DIAGNOSIS — O34219 Maternal care for unspecified type scar from previous cesarean delivery: Secondary | ICD-10-CM | POA: Diagnosis not present

## 2022-07-14 DIAGNOSIS — O4202 Full-term premature rupture of membranes, onset of labor within 24 hours of rupture: Secondary | ICD-10-CM | POA: Diagnosis not present

## 2022-07-14 DIAGNOSIS — Z880 Allergy status to penicillin: Secondary | ICD-10-CM | POA: Diagnosis not present

## 2022-07-14 DIAGNOSIS — O10919 Unspecified pre-existing hypertension complicating pregnancy, unspecified trimester: Secondary | ICD-10-CM | POA: Diagnosis present

## 2022-07-14 LAB — TYPE AND SCREEN
ABO/RH(D): O POS
Antibody Screen: NEGATIVE

## 2022-07-14 LAB — CBC
HCT: 34.6 % — ABNORMAL LOW (ref 36.0–46.0)
Hemoglobin: 11.9 g/dL — ABNORMAL LOW (ref 12.0–15.0)
MCH: 31 pg (ref 26.0–34.0)
MCHC: 34.4 g/dL (ref 30.0–36.0)
MCV: 90.1 fL (ref 80.0–100.0)
Platelets: 166 10*3/uL (ref 150–400)
RBC: 3.84 MIL/uL — ABNORMAL LOW (ref 3.87–5.11)
RDW: 13.9 % (ref 11.5–15.5)
WBC: 9 10*3/uL (ref 4.0–10.5)
nRBC: 0 % (ref 0.0–0.2)

## 2022-07-14 LAB — RPR: RPR Ser Ql: NONREACTIVE

## 2022-07-14 MED ORDER — FENTANYL-BUPIVACAINE-NACL 0.5-0.125-0.9 MG/250ML-% EP SOLN: 12.0000 mL/h | EPIDURAL | Status: DC | PRN

## 2022-07-14 MED ORDER — SENNOSIDES-DOCUSATE SODIUM 8.6-50 MG PO TABS
2.0000 | ORAL_TABLET | Freq: Every day | ORAL | Status: DC
  Administered 2022-07-15: 2 via ORAL
  Filled 2022-07-14 (×2): qty 2

## 2022-07-14 MED ORDER — OXYCODONE-ACETAMINOPHEN 5-325 MG PO TABS: 1.0000 | ORAL_TABLET | ORAL | Status: DC | PRN

## 2022-07-14 MED ORDER — PRENATAL MULTIVITAMIN CH
1.0000 | ORAL_TABLET | Freq: Every day | ORAL | Status: DC
  Administered 2022-07-15: 1 via ORAL
  Filled 2022-07-14: qty 1

## 2022-07-14 MED ORDER — FLEET ENEMA 7-19 GM/118ML RE ENEM: 1.0000 | ENEMA | RECTAL | Status: DC | PRN

## 2022-07-14 MED ORDER — TERBUTALINE SULFATE 1 MG/ML IJ SOLN: 0.2500 mg | Freq: Once | INTRAMUSCULAR | Status: DC | PRN

## 2022-07-14 MED ORDER — SIMETHICONE 80 MG PO CHEW: 80.0000 mg | CHEWABLE_TABLET | ORAL | Status: DC | PRN

## 2022-07-14 MED ORDER — MISOPROSTOL 25 MCG QUARTER TABLET
25.0000 ug | ORAL_TABLET | Freq: Once | ORAL | Status: AC
  Administered 2022-07-14: 25 ug via VAGINAL
  Filled 2022-07-14: qty 1

## 2022-07-14 MED ORDER — ONDANSETRON HCL 4 MG/2ML IJ SOLN
4.0000 mg | Freq: Four times a day (QID) | INTRAMUSCULAR | Status: DC | PRN
  Administered 2022-07-14: 4 mg via INTRAVENOUS
  Filled 2022-07-14: qty 2

## 2022-07-14 MED ORDER — FENTANYL-BUPIVACAINE-NACL 0.5-0.125-0.9 MG/250ML-% EP SOLN
EPIDURAL | Status: DC | PRN
  Administered 2022-07-14: 12 mL/h via EPIDURAL

## 2022-07-14 MED ORDER — OXYCODONE-ACETAMINOPHEN 5-325 MG PO TABS: 2.0000 | ORAL_TABLET | ORAL | Status: DC | PRN

## 2022-07-14 MED ORDER — LIDOCAINE HCL (PF) 1 % IJ SOLN: 30.0000 mL | INTRAMUSCULAR | Status: DC | PRN

## 2022-07-14 MED ORDER — MISOPROSTOL 50MCG HALF TABLET
50.0000 ug | ORAL_TABLET | Freq: Once | ORAL | Status: AC
  Administered 2022-07-14: 50 ug via ORAL
  Filled 2022-07-14: qty 1

## 2022-07-14 MED ORDER — LORATADINE 10 MG PO TABS
10.0000 mg | ORAL_TABLET | Freq: Every day | ORAL | Status: DC
  Administered 2022-07-15: 10 mg via ORAL
  Filled 2022-07-14: qty 1

## 2022-07-14 MED ORDER — LIDOCAINE HCL (PF) 1 % IJ SOLN
INTRAMUSCULAR | Status: DC | PRN
  Administered 2022-07-14: 5 mL via EPIDURAL

## 2022-07-14 MED ORDER — OXYTOCIN-SODIUM CHLORIDE 30-0.9 UT/500ML-% IV SOLN
2.5000 [IU]/h | INTRAVENOUS | Status: DC
  Filled 2022-07-14: qty 500

## 2022-07-14 MED ORDER — IBUPROFEN 600 MG PO TABS
600.0000 mg | ORAL_TABLET | Freq: Four times a day (QID) | ORAL | Status: DC
  Administered 2022-07-14 – 2022-07-15 (×3): 600 mg via ORAL
  Filled 2022-07-14 (×3): qty 1

## 2022-07-14 MED ORDER — LACTATED RINGERS IV SOLN: INTRAVENOUS | Status: DC

## 2022-07-14 MED ORDER — LACTATED RINGERS IV SOLN: 500.0000 mL | INTRAVENOUS | Status: DC | PRN

## 2022-07-14 MED ORDER — WITCH HAZEL-GLYCERIN EX PADS: 1.0000 | MEDICATED_PAD | CUTANEOUS | Status: DC | PRN

## 2022-07-14 MED ORDER — FUROSEMIDE 20 MG PO TABS
20.0000 mg | ORAL_TABLET | Freq: Every day | ORAL | Status: DC
  Administered 2022-07-15: 20 mg via ORAL
  Filled 2022-07-14: qty 1

## 2022-07-14 MED ORDER — SOD CITRATE-CITRIC ACID 500-334 MG/5ML PO SOLN
30.0000 mL | ORAL | Status: DC | PRN
  Administered 2022-07-14: 30 mL via ORAL
  Filled 2022-07-14: qty 30

## 2022-07-14 MED ORDER — DIBUCAINE (PERIANAL) 1 % EX OINT: 1.0000 | TOPICAL_OINTMENT | CUTANEOUS | Status: DC | PRN

## 2022-07-14 MED ORDER — ZOLPIDEM TARTRATE 5 MG PO TABS: 5.0000 mg | ORAL_TABLET | Freq: Every evening | ORAL | Status: DC | PRN

## 2022-07-14 MED ORDER — COCONUT OIL OIL: 1.0000 | TOPICAL_OIL | Status: DC | PRN

## 2022-07-14 MED ORDER — DIPHENHYDRAMINE HCL 50 MG/ML IJ SOLN: 12.5000 mg | INTRAMUSCULAR | Status: DC | PRN

## 2022-07-14 MED ORDER — BENZOCAINE-MENTHOL 20-0.5 % EX AERO: 1.0000 | INHALATION_SPRAY | CUTANEOUS | Status: DC | PRN

## 2022-07-14 MED ORDER — ACETAMINOPHEN 325 MG PO TABS: 650.0000 mg | ORAL_TABLET | ORAL | Status: DC | PRN

## 2022-07-14 MED ORDER — TRANEXAMIC ACID-NACL 1000-0.7 MG/100ML-% IV SOLN: 1000.0000 mg | INTRAVENOUS | Status: AC

## 2022-07-14 MED ORDER — PHENYLEPHRINE 80 MCG/ML (10ML) SYRINGE FOR IV PUSH (FOR BLOOD PRESSURE SUPPORT): 80.0000 ug | PREFILLED_SYRINGE | INTRAVENOUS | Status: DC | PRN

## 2022-07-14 MED ORDER — OXYTOCIN BOLUS FROM INFUSION
333.0000 mL | Freq: Once | INTRAVENOUS | Status: AC
  Administered 2022-07-14: 333 mL via INTRAVENOUS

## 2022-07-14 MED ORDER — TETANUS-DIPHTH-ACELL PERTUSSIS 5-2.5-18.5 LF-MCG/0.5 IM SUSY: 0.5000 mL | PREFILLED_SYRINGE | Freq: Once | INTRAMUSCULAR | Status: DC

## 2022-07-14 MED ORDER — ONDANSETRON HCL 4 MG/2ML IJ SOLN: 4.0000 mg | INTRAMUSCULAR | Status: DC | PRN

## 2022-07-14 MED ORDER — ONDANSETRON HCL 4 MG PO TABS: 4.0000 mg | ORAL_TABLET | ORAL | Status: DC | PRN

## 2022-07-14 MED ORDER — LACTATED RINGERS IV SOLN: 500.0000 mL | Freq: Once | INTRAVENOUS | Status: DC

## 2022-07-14 MED ORDER — TRANEXAMIC ACID-NACL 1000-0.7 MG/100ML-% IV SOLN
INTRAVENOUS | Status: AC
  Administered 2022-07-14: 1000 mg
  Filled 2022-07-14: qty 100

## 2022-07-14 MED ORDER — EPHEDRINE 5 MG/ML INJ: 10.0000 mg | INTRAVENOUS | Status: DC | PRN

## 2022-07-14 MED ORDER — DIPHENHYDRAMINE HCL 25 MG PO CAPS: 25.0000 mg | ORAL_CAPSULE | Freq: Four times a day (QID) | ORAL | Status: DC | PRN

## 2022-07-14 MED ORDER — FENTANYL-BUPIVACAINE-NACL 0.5-0.125-0.9 MG/250ML-% EP SOLN
EPIDURAL | Status: AC
  Filled 2022-07-14: qty 250

## 2022-07-14 NOTE — Lactation Note (Signed)
This note was copied from a baby's chart. Lactation Consultation Note  Patient Name: [name] IEPPI'[id number] Date: 07/14/2022   Age:31 hours  LC spoke with the RN [place] Nurse. She stated that the parent has already latched the infant and it was her 4th baby. Lactation will follow up on the MBU.   Maternal Data    Feeding    LATCH Score                    Lactation Tools Discussed/Used    Interventions    Discharge    Consult Status      Lysbeth Penner 07/14/2022, 3:39 PM

## 2022-07-14 NOTE — Progress Notes (Signed)
Labor Progress Note Cynthia Crawford is a 31 y.o. G4P3003 at [redacted]w[redacted]d presented for IOL for CHTN  S:  Comfortable, no c/o.  O:  BP 133/83   Pulse 78   Temp 98.2 F (36.8 C) (Oral)   Resp 16   Ht 5\' 3"  (1.6 m)   Wt 90.7 kg   LMP 10/22/2021 (Exact Date)   BMI 35.43 kg/m  EFM: baseline 140 bpm/ mod variability/ + accels/ no decels  Toco/IUPC: irreg SVE: Dilation: 1.5 Effacement (%): 50 Cervical Position: Posterior Station: -2 Presentation: Vertex Exam by:: Colman Cater, CNM   A/P: 31 y.o. [id number] [redacted]w[redacted]d  1. Labor: latent 2. FWB: Cat I 3. Pain: analgesia/anesthesia/NO prn 4. CHTN: stable  Consented for FB, placed w/o difficulty. Continue Cytotec. Anticipate SVD.  Julianne Handler, CNM 9:50 AM

## 2022-07-14 NOTE — Lactation Note (Signed)
This note was copied from a baby's chart. Lactation Consultation Note  Patient Name: Cynthia Crawford Date: 07/14/2022   Age:31 hours Attempted to see mom but everyone was sleeping. Maternal Data    Feeding    LATCH Score                    Lactation Tools Discussed/Used    Interventions    Discharge    Consult Status      Cynthia Crawford 07/14/2022, 10:19 PM

## 2022-07-14 NOTE — Anesthesia Procedure Notes (Signed)
Epidural Patient location during procedure: OB Start time: 07/14/2022 1:38 PM End time: 07/14/2022 1:49 PM  Staffing Anesthesiologist: Barnet Glasgow, MD Performed: anesthesiologist   Preanesthetic Checklist Completed: patient identified, IV checked, site marked, risks and benefits discussed, surgical consent, monitors and equipment checked, pre-op evaluation and timeout performed  Epidural Patient position: sitting Prep: DuraPrep and site prepped and draped Patient monitoring: continuous pulse ox and blood pressure Approach: midline Location: L3-L4 Injection technique: LOR air  Needle:  Needle type: Tuohy  Needle gauge: 17 G Needle length: 9 cm and 9 Needle insertion depth: 6 cm Catheter type: closed end flexible Catheter size: 19 Gauge Catheter at skin depth: 12 cm Test dose: negative  Assessment Events: blood not aspirated, injection not painful, no injection resistance, no paresthesia and negative IV test  Additional Notes Patient identified. Risks/Benefits/Options discussed with patient including but not limited to bleeding, infection, nerve damage, paralysis, failed block, incomplete pain control, headache, blood pressure changes, nausea, vomiting, reactions to medication both or allergic, itching and postpartum back pain. Confirmed with bedside nurse the patient's most recent platelet count. Confirmed with patient that they are not currently taking any anticoagulation, have any bleeding history or any family history of bleeding disorders. Patient expressed understanding and wished to proceed. All questions were answered. Sterile technique was used throughout the entire procedure. Please see nursing notes for vital signs. Test dose was given through epidural needle and negative prior to continuing to dose epidural or start infusion. Warning signs of high block given to the patient including shortness of breath, tingling/numbness in hands, complete motor block, or any  concerning symptoms with instructions to call for help. Patient was given instructions on fall risk and not to get out of bed. All questions and concerns addressed with instructions to call with any issues. 1 Attempt (S) . Patient tolerated procedure well.

## 2022-07-14 NOTE — Discharge Summary (Shared)
Postpartum Discharge Summary  Date of Service updated***     Patient Name: Cynthia Crawford DOB: 1991-06-13 MRN: [id number]  Date of admission: 07/14/2022 Delivery date:07/14/2022  Delivering provider: Julianne Handler  Date of discharge: 07/14/2022  Admitting diagnosis: Hypertension in pregnancy, antepartum [O16.9] Intrauterine pregnancy: [redacted]w[redacted]d    Secondary diagnosis:  Principal Problem:   Hypertension in pregnancy, antepartum Active Problems:   SVD (spontaneous vaginal delivery)  Additional problems: CHTN    Discharge diagnosis: Term Pregnancy Delivered and CHTN                                              Post partum procedures:{Postpartum procedures:23558} Augmentation: Cytotec and IP Foley Complications: {OB Labor/Delivery Complications:20784}  Hospital course: Induction of Labor With Vaginal Delivery   31y.o. yo [id number] 367w6das admitted to the hospital 07/14/2022 for induction of labor.  Indication for induction:  CHTN .  Patient had an labor course complicated by none. Received TXA after placenta. Membrane Rupture Time/Date: 2:59 PM ,07/14/2022   Delivery Method:Vaginal, Spontaneous  Episiotomy: None  Lacerations:  None  Details of delivery can be found in separate delivery note.  Patient had a postpartum course complicated by***. Patient is discharged home 07/14/22.  Newborn Data: Birth date:07/14/2022  Birth time:3:01 PM  Gender:Female  Living status:Living  Apgars:8 ,9  Weight:3030 g   Magnesium Sulfate received: No BMZ received: No Rhophylac:N/A MMR:N/A T-DaP:Given prenatally Flu: Yes Transfusion:No  Physical exam  Vitals:   07/14/22 1615 07/14/22 1630 07/14/22 1706 07/14/22 1730  BP: (!) 121/101 (!) 140/83 126/74 131/85  Pulse: (!) 126 73 67 75  Resp: _0 Temp:    98 F (36.7 C)  TempSrc:      SpO2:      Weight:      Height:       General: {Exam; general:21111117} Lochia: {Desc;  appropriate/inappropriate:30686::"appropriate"} Uterine Fundus: {Desc; firm/soft:30687} Incision: {Exam; incision:21111123} DVT Evaluation: {Exam; dvt:2111122} Labs: Lab Results  Component Value Date   WBC 9.0 07/14/2022   HGB 11.9 (L) 07/14/2022   HCT 34.6 (L) 07/14/2022   MCV 90.1 07/14/2022   PLT 166 07/14/2022      Latest Ref Rng & Units 01/02/2022    3:48 PM  CMP  Glucose 70 - 99 mg/dL 100   BUN 6 - 20 mg/dL 8   Creatinine 0.57 - 1.00 mg/dL 0.70   Sodium 134 - 144 mmol/L 137   Potassium 3.5 - 5.2 mmol/L 4.0   Chloride 96 - 106 mmol/L 102   CO2 20 - 29 mmol/L 20   Calcium 8.7 - 10.2 mg/dL 9.0   Total Protein 6.0 - 8.5 g/dL 7.0   Total Bilirubin 0.0 - 1.2 mg/dL <0.2   Alkaline Phos 44 - 121 IU/L 51   AST 0 - 40 IU/L 19   ALT 0 - 32 IU/L 17    Edinburgh Score:    07/14/2022    5:31 PM  Edinburgh Postnatal Depression Scale Screening Tool  I have been able to laugh and see the funny side of things. 0  I have looked forward with enjoyment to things. 0  I have blamed myself unnecessarily when things went wrong. 0  I have been anxious or worried for no good reason. 0  I have felt scared or panicky for no good reason. 0  Things have been getting on top of me. 0  I have been so unhappy that I have had difficulty sleeping. 0  I have felt sad or miserable. 0  I have been so unhappy that I have been crying. 0  The thought of harming myself has occurred to me. 0  Edinburgh Postnatal Depression Scale Total 0     After visit meds:  Allergies as of 07/14/2022       Reactions   Penicillins Hives   Has patient had a PCN reaction causing immediate rash, facial/tongue/throat swelling, SOB or lightheadedness with hypotension: Yes Has patient had a PCN reaction causing severe rash involving mucus membranes or skin necrosis: No Has patient had a PCN reaction that required hospitalization No Has patient had a PCN reaction occurring within the last 10 years: No If all of the  above answers are "NO", then may proceed with Cephalosporin use.   Sulfa Antibiotics Hives     Med Rec must be completed prior to using this [facility]***        Discharge home in stable condition Infant Feeding: Breast Infant Disposition:home with mother Discharge instruction: per After Visit Summary and Postpartum booklet. Activity: Advance as tolerated. Pelvic rest for 6 weeks.  Diet: iron rich diet Future Appointments:No future appointments. Follow up Visit:   Please schedule this patient for a In person postpartum visit in 6 weeks with the following provider: Any provider. Additional Postpartum F/U:BP check 1 week  Low risk pregnancy complicated by: HTN Delivery mode:  Vaginal, Spontaneous  Anticipated Birth Control:  Unsure   07/14/2022 Wilhemina Cash, MD

## 2022-07-14 NOTE — Anesthesia Preprocedure Evaluation (Signed)
Anesthesia Evaluation  Patient identified by MRN, date of birth, ID band Patient awake    Reviewed: Allergy & Precautions, NPO status , Patient's Chart, lab work & pertinent test results  Airway Mallampati: II  TM Distance: >3 FB Neck ROM: Full    Dental no notable dental hx. (+) Teeth Intact, Dental Advisory Given   Pulmonary asthma ,    Pulmonary exam normal breath sounds clear to auscultation       Cardiovascular hypertension, Normal cardiovascular exam Rhythm:Regular Rate:Normal     Neuro/Psych    GI/Hepatic GERD  ,  Endo/Other    Renal/GU      Musculoskeletal   Abdominal (+) + obese (BMI 35.4),   Peds  Hematology Lab Results      Component                Value               Date                           HGB                      11.9 (L)            07/14/2022                HCT                      34.6 (L)            07/14/2022                PLT                      166                 07/14/2022              Anesthesia Other Findings All: PCN SULFA  Reproductive/Obstetrics (+) Pregnancy                             Anesthesia Physical Anesthesia Plan  ASA: 3  Anesthesia Plan: Epidural   Post-op Pain Management:    Induction:   PONV Risk Score and Plan:   Airway Management Planned:   Additional Equipment:   Intra-op Plan:   Post-operative Plan:   Informed Consent: I have reviewed the patients History and Physical, chart, labs and discussed the procedure including the risks, benefits and alternatives for the proposed anesthesia with the patient or authorized representative who has indicated his/her understanding and acceptance.       Plan Discussed with:   Anesthesia Plan Comments: (37.6 wk G4P3 w cHtn and BMI  35.4 for LEA)        Anesthesia Quick Evaluation

## 2022-07-15 MED ORDER — FUROSEMIDE 20 MG PO TABS: 20.0000 mg | ORAL_TABLET | Freq: Every day | ORAL | 0 refills | Status: DC

## 2022-07-15 MED ORDER — IBUPROFEN 600 MG PO TABS: 600.0000 mg | ORAL_TABLET | Freq: Four times a day (QID) | ORAL | 0 refills | Status: DC | PRN

## 2022-07-15 NOTE — Discharge Instructions (Addendum)
Take lasix as prescribed for a total of 5 days.  Continue blood pressure checks at home.   Follow-up in clinic in 1 week for blood pressure check as scheduled.

## 2022-07-15 NOTE — Anesthesia Postprocedure Evaluation (Signed)
Anesthesia Post Note  Patient: Cynthia Crawford  Procedure(s) Performed: AN AD [place]     Patient location during evaluation: Mother Baby Anesthesia Type: Epidural Level of consciousness: awake Pain management: satisfactory to patient Vital Signs Assessment: post-procedure vital signs reviewed and stable Respiratory status: spontaneous breathing Cardiovascular status: stable Anesthetic complications: no   No notable events documented.  Last Vitals:  Vitals:   07/15/22 0230 07/15/22 0615  BP: 130/79 134/81  Pulse: 66 (!) 57  Resp: 20 20  Temp: 36.6 C 36.8 C  SpO2: 99% 100%    Last Pain:  Vitals:   07/15/22 0615  TempSrc: Oral  PainSc:    Pain Goal:                   Casimer Lanius

## 2022-07-15 NOTE — Lactation Note (Signed)
This note was copied from a baby's chart. Lactation Consultation Note  Patient Name: Cynthia Crawford [id number]'[id number] Date: 07/15/2022   Age:31 hours Attempted to see mom again but she was sleeping. Maternal Data    Feeding    LATCH Score                    Lactation Tools Discussed/Used    Interventions    Discharge    Consult Status      Haji Delaine, Elta Gu[place] 07/15/2022, 3:02 AM

## 2022-07-15 NOTE — Discharge Summary (Signed)
Postpartum Discharge Summary   Date of Service updated - yes                            Patient Name: Cynthia Crawford DOB: 06/16/1991 MRN: [id number]   Date of admission: 07/14/2022 Delivery date:07/14/2022  Delivering provider: BHAMBRI, MELANIE  Date of discharge: 07/15/2022   Admitting diagnosis: Hypertension in pregnancy, antepartum [O16.9] Intrauterine pregnancy: [redacted]w[redacted]d     Secondary diagnosis:  Principal Problem:   Hypertension in pregnancy, antepartum Active Problems:   Preexisting hypertension complicating pregnancy, antepartum   SVD (spontaneous vaginal delivery)   Additional problems: CHTN                          Discharge diagnosis: Term Pregnancy Delivered and CHTN                                              Post partum procedures: none Augmentation: Cytotec and IP Foley Complications: None   Hospital course: Induction of Labor With Vaginal Delivery   31 y.o. yo G4P3003 at [redacted]w[redacted]d was admitted to the hospital 07/14/2022 for induction of labor.  Indication for induction:  CHTN .  Patient had an labor course complicated by none. Received TXA after placenta. Membrane Rupture Time/Date: 2:59 PM ,07/14/2022   Delivery Method:Vaginal, Spontaneous  Episiotomy: None  Lacerations:  None  Details of delivery can be found in separate delivery note.  Patient had an uncomplicated postpartum course. Patient is discharged home 07/15/22.   Newborn Data: Birth date:07/14/2022  Birth time:3:01 PM  Gender:Female  Living status:Living  Apgars:8 ,9  Weight:3030 g    Magnesium Sulfate received: No BMZ received: No Rhophylac:N/A MMR:N/A T-DaP:Given prenatally Flu: Yes Transfusion:No   Physical exam        Vitals:    07/14/22 1822 07/14/22 2230 07/15/22 0230 07/15/22 0615  BP: 126/77 125/83 130/79 134/81  Pulse: 80 78 66 (!) 57  Resp: 18 20 20 20  Temp: 98 F (36.7 C) 98.5 F (36.9 C) 97.9 F (36.6 C) 98.2 F (36.8 C)  TempSrc:   Oral Oral Oral  SpO2: 99%  99% 99% 100%  Weight:          Height:            General: alert, cooperative, and no distress Lochia: appropriate Uterine Fundus: firm Incision: n/a DVT Evaluation: No evidence of DVT seen on physical exam. Labs: Recent Labs       Lab Results  Component Value Date    WBC 9.0 07/14/2022    HGB 11.9 (L) 07/14/2022    HCT 34.6 (L) 07/14/2022    MCV 90.1 07/14/2022    PLT 166 07/14/2022          Latest Ref Rng & Units 01/02/2022    3:48 PM  CMP  Glucose 70 - 99 mg/dL 100   BUN 6 - 20 mg/dL 8   Creatinine 0.57 - 1.00 mg/dL 0.70   Sodium 134 - 144 mmol/L 137   Potassium 3.5 - 5.2 mmol/L 4.0   Chloride 96 - 106 mmol/L 102   CO2 20 - 29 mmol/L 20   Calcium 8.7 - 10.2 mg/dL 9.0   Total Protein 6.0 - 8.5 g/dL 7.0   Total Bilirubin 0.0 - 1.2 mg/dL <0.2   Alkaline   Phos 44 - 121 IU/L 51   AST 0 - 40 IU/L 19   ALT 0 - 32 IU/L 17     Edinburgh Score:     07/14/2022    5:31 PM  Edinburgh Postnatal Depression Scale Screening Tool  I have been able to laugh and see the funny side of things. 0  I have looked forward with enjoyment to things. 0  I have blamed myself unnecessarily when things went wrong. 0  I have been anxious or worried for no good reason. 0  I have felt scared or panicky for no good reason. 0  Things have been getting on top of me. 0  I have been so unhappy that I have had difficulty sleeping. 0  I have felt sad or miserable. 0  I have been so unhappy that I have been crying. 0  The thought of harming myself has occurred to me. 0  Edinburgh Postnatal Depression Scale Total 0        After visit meds:  Allergies as of 07/15/2022         Reactions    Penicillins Hives    Has patient had a PCN reaction causing immediate rash, facial/tongue/throat swelling, SOB or lightheadedness with hypotension: Yes Has patient had a PCN reaction causing severe rash involving mucus membranes or skin necrosis: No Has patient had a PCN reaction that required hospitalization  No Has patient had a PCN reaction occurring within the last 10 years: No If all of the above answers are "NO", then may proceed with Cephalosporin use.    Sulfa Antibiotics Hives            Medication List       STOP taking these medications     aspirin EC 81 MG tablet    labetalol 100 MG tablet Commonly known as: NORMODYNE    promethazine 25 MG tablet Commonly known as: PHENERGAN           TAKE these medications     cetirizine 10 MG tablet Commonly known as: ZYRTEC Take 10 mg by mouth daily.    ferrous sulfate 325 (65 FE) MG tablet Commonly known as: FerrouSul Take 1 tablet (325 mg total) by mouth every other day.    furosemide 20 MG tablet Commonly known as: LASIX Take 1 tablet (20 mg total) by mouth daily. Start taking on: July 16, 2022    ibuprofen 600 MG tablet Commonly known as: ADVIL Take 1 tablet (600 mg total) by mouth every 6 (six) hours as needed. Take with food.    multivitamin tablet Take by mouth daily.               Discharge home in stable condition Infant Feeding: Breast Infant Disposition:home with mother Discharge instruction: per After Visit Summary and Postpartum booklet. Activity: Advance as tolerated. Pelvic rest for 6 weeks.  Diet: iron rich diet Future Appointments:No future appointments. Follow up Visit: msg sent to clinic     Please schedule this patient for a In person postpartum visit in 6 weeks with the following provider: Any provider. Additional Postpartum F/U:BP check 1 week  Low risk pregnancy complicated by: HTN Delivery mode:  Vaginal, Spontaneous  Anticipated Birth Control:  Unsure   Chiagoziem Ndulue MD MPH OB Fellow, Faculty Practice [place], [facility] 07/15/2022   

## 2022-07-15 NOTE — Progress Notes (Addendum)
MOB was referred for history of anxiety. * Referral screened out by Clinical Social Worker because none of the following criteria appear to apply: ~ History of anxiety/depression during this pregnancy, or of post-partum depression following prior delivery. ~ Diagnosis of anxiety and/or depression within last 3 years OR * MOB's symptoms currently being treated with medication and/or therapy. Per MOB's prenatal care records, MOB takes hydroxyzine PRN. Per chart review, MOB sees a therapist. No mental health concerns noted in MOB's PNC records. Edinburgh 0 during current admission.  Please contact the Clinical Social Worker if needs arise, by MOB request, or if MOB scores greater than 9/yes to question 10 on Edinburgh Postpartum Depression Screen.  Signed,  Sharron Simpson K. Yeimy Brabant, MSW, LCSWA, LCASA 07/15/2022 9:22 AM 

## 2022-07-15 NOTE — Progress Notes (Signed)
Post Partum Day 1 Subjective: no complaints, voiding, tolerating PO, + flatus, and ambulating . Pt without any complaints.  Objective: Blood pressure 130/79, pulse 66, temperature 97.9 F (36.6 C), temperature source Oral, resp. rate 20, height 5\' 3"  (1.6 m), weight 90.7 kg, last menstrual period 10/22/2021, SpO2 99 %, unknown if currently breastfeeding.  Physical Exam:  General: alert and no distress Lochia: appropriate Uterine Fundus: firm DVT Evaluation: No evidence of DVT seen on physical exam. No significant calf/ankle edema.  Recent Labs    07/14/22 0409  HGB 11.9*  HCT 34.6*    Assessment/Plan: Plan for discharge tomorrow and Breastfeeding. Pt undecided on contraception will discuss with her OB outpatient.  Feeding: Breast feeding FOB at bedside   LOS: 1 day   Shirlyn Goltz, Medical Student 07/15/2022, 6:46 AM

## 2022-07-15 NOTE — Lactation Note (Signed)
This note was copied from a baby's chart. Lactation Consultation Note  Patient Name: Girl Haruka Barkalow [id number] Date: 07/15/2022 Reason for consult: Initial assessment;Early term 37-38.6wks Age:31 hours   P4: Early term infant at 37+6 weeks Feeding preference: Breast Weight loss: 2%  "Emryn" was asleep next to birth parent when I arrived.  Parent had no questions/concerns related to breast feeding.  Baby has been latching and feeding well; has voided/stooled.  Encouraged to feed 8-12 times/24 hours or sooner if "Emryn" shows cues. Encouraged lots of STS, breast massage and hand expression.  Suggested she call her RN/LC for latch assistance as needed.  Birth parent requested a manual pump.  Sized the #24 flange to be appropriate at this time.  Support person present.   Maternal Data Has patient been taught Hand Expression?: Yes Does the patient have breastfeeding experience prior to this delivery?: Yes How long did the patient breastfeed?: Breast fed all her other children; the last one for 2 years  Feeding Mother's Current Feeding Choice: Breast Milk  LATCH Score                    Lactation Tools Discussed/Used Tools: Pump Breast pump type: Manual Pump Education: Setup, frequency, and cleaning Reason for Pumping: prn per birth parent's request Pumping frequency: prn  Interventions Interventions: Breast feeding basics reviewed;Education;LC Services brochure  Discharge Pump: Personal (Birth parent has three pumps)  Consult Status Consult Status: Follow-up Date: 07/16/22 Follow-up type: In-patient    Eligh Rybacki R Harue Pribble 07/15/2022, 8:45 AM

## 2022-07-18 NOTE — Progress Notes (Signed)
I,Sulibeya S Dimas,acting as a Education administrator for Lavon Paganini, MD.,have documented all relevant documentation on the behalf of Lavon Paganini, MD,as directed by  Lavon Paganini, MD while in the presence of Lavon Paganini, MD.     Established patient visit   Patient: Cynthia Crawford   DOB: Aug 26, 1991   31 y.o. Female  MRN: [id number] Visit Date: 07/20/2022  Today's healthcare provider: Lavon Paganini, MD   Chief Complaint  Patient presents with   Hypertension   Anxiety   Subjective    HPI  Hypertension, follow-up  BP Readings from Last 3 Encounters:  07/20/22 (!) 136/90  07/15/22 134/81  07/11/22 125/77   Wt Readings from Last 3 Encounters:  07/20/22 184 lb 12.8 oz (83.8 kg)  07/14/22 200 lb (90.7 kg)  07/11/22 199 lb (90.3 kg)     She was last seen for hypertension 9 months ago.  BP at that visit was 129/84. Management since that visit includes 11/21/21 D/C HCTZ to Labetalol 100 mg BID due to pregnancy.  She reports excellent compliance with treatment. Patient reports taking labetalol 100 mg BID daily up until Saturday. Patient delivered her baby on 07/14/22. She was advised to stop labetalol and continue with Lasix only. She reports last pill yesterday. Will need a refill.  She is not having side effects.  She is following a Regular diet. She is not exercising. She does not smoke.  Use of agents associated with hypertension: none.   Outside blood pressures are elevated 144/99. Symptoms: No chest pain No chest pressure  No palpitations No syncope  No dyspnea No orthopnea  No paroxysmal nocturnal dyspnea Yes lower extremity edema   Pertinent labs Lab Results  Component Value Date   CHOL 177 09/04/2021   HDL 71 09/04/2021   LDLCALC 96 09/04/2021   TRIG 47 09/04/2021   CHOLHDL 2.5 09/04/2021   Lab Results  Component Value Date   NA 137 01/02/2022   K 4.0 01/02/2022   CREATININE 0.70 01/02/2022   EGFR 119 01/02/2022   GLUCOSE 100 (H)  01/02/2022   TSH 0.836 09/04/2021     The ASCVD Risk score (Arnett DK, et al., 2019) failed to calculate for the following reasons:   The 2019 ASCVD risk score is only valid for ages 88 to 4  ---------------------------------------------------------------------------------------------------  Medications: Outpatient Medications Prior to Visit  Medication Sig   cetirizine (ZYRTEC) 10 MG tablet Take 10 mg by mouth daily.   Prenatal Vit-Fe Fumarate-FA (PRENATAL PO) Take 1 tablet by mouth daily.   [DISCONTINUED] furosemide (LASIX) 20 MG tablet Take 1 tablet (20 mg total) by mouth daily.   ferrous sulfate (FERROUSUL) 325 (65 FE) MG tablet Take 1 tablet (325 mg total) by mouth every other day. (Patient not taking: Reported on 07/20/2022)   [DISCONTINUED] Calcium Carbonate Antacid (TUMS PO) Take 1 tablet by mouth daily as needed (heartburn).   [DISCONTINUED] ibuprofen (ADVIL) 600 MG tablet Take 1 tablet (600 mg total) by mouth every 6 (six) hours as needed. Take with food.   No facility-administered medications prior to visit.    Review of Systems  Constitutional:  Negative for appetite change and fatigue.  Eyes:  Negative for visual disturbance.  Respiratory:  Negative for chest tightness and shortness of breath.   Cardiovascular:  Positive for leg swelling. Negative for chest pain and palpitations.  Gastrointestinal:  Negative for abdominal pain, nausea and vomiting.  Neurological:  Negative for headaches.       Objective    BP Marland Kitchen)  136/90 (BP Location: Left Arm, Patient Position: Sitting, Cuff Size: Large)   Pulse 74   Temp 98.3 F (36.8 C) (Oral)   Resp 16   Ht _0  (1.626 m)   Wt 184 lb 12.8 oz (83.8 kg)   SpO2 98%   Breastfeeding Yes   BMI 31.72 kg/m    Physical Exam Vitals reviewed.  Constitutional:      General: She is not in acute distress.    Appearance: Normal appearance. She is well-developed. She is not diaphoretic.  HENT:     Head: Normocephalic and  atraumatic.  Eyes:     General: No scleral icterus.    Conjunctiva/sclera: Conjunctivae normal.  Neck:     Thyroid: No thyromegaly.  Cardiovascular:     Rate and Rhythm: Normal rate and regular rhythm.     Pulses: Normal pulses.     Heart sounds: Normal heart sounds. No murmur heard. Pulmonary:     Effort: Pulmonary effort is normal. No respiratory distress.     Breath sounds: Normal breath sounds. No wheezing, rhonchi or rales.  Musculoskeletal:     Cervical back: Neck supple.     Right lower leg: No edema.     Left lower leg: No edema.  Lymphadenopathy:     Cervical: No cervical adenopathy.  Skin:    General: Skin is warm and dry.     Findings: No rash.  Neurological:     Mental Status: She is alert and oriented to person, place, and time. Mental status is at baseline.  Psychiatric:        Mood and Affect: Mood normal.        Behavior: Behavior normal.       No results found for any visits on 07/20/22.  Assessment & Plan     Problem List Items Addressed This Visit       Cardiovascular and Mediastinum   Benign essential hypertension - Primary    Chronic and uncontrolled No longer pregnant and would like to switch off of labetalol Was given 4 days's of Lasix by OB, but no potassium supplement and potassium was not checked We will recheck metabolic panel today Discussed that HCTZ and enalapril are contraindicated in breast-feeding She previously had lip swelling from amlodipine and is allergic Trial of hydralazine twice daily      Relevant Medications   hydrALAZINE (APRESOLINE) 25 MG tablet   Other Relevant Orders   Comprehensive metabolic panel   Lipid panel     Other   Prediabetes    Recommend low carb diet Recheck A1c       Relevant Orders   Hemoglobin A1c     Return in about 4 weeks (around 08/17/2022) for BP f/u, virtual ok.      I, Lavon Paganini, MD, have reviewed all documentation for this visit. The documentation on 07/20/22 for the exam,  diagnosis, procedures, and orders are all accurate and complete.   Arthuro Canelo, Dionne Bucy, MD, MPH [facility]

## 2022-07-19 ENCOUNTER — Encounter: Payer: 59 | Admitting: Advanced Practice Midwife

## 2022-07-20 ENCOUNTER — Encounter: Payer: Self-pay | Admitting: Family Medicine

## 2022-07-20 ENCOUNTER — Ambulatory Visit: Payer: 59 | Admitting: Family Medicine

## 2022-07-20 VITALS — BP 136/90 | HR 74 | Temp 98.3°F | Resp 16 | Ht 64.0 in | Wt 184.8 lb

## 2022-07-20 DIAGNOSIS — I10 Essential (primary) hypertension: Secondary | ICD-10-CM | POA: Diagnosis not present

## 2022-07-20 DIAGNOSIS — R7303 Prediabetes: Secondary | ICD-10-CM

## 2022-07-20 MED ORDER — HYDRALAZINE HCL 25 MG PO TABS: 25.0000 mg | ORAL_TABLET | Freq: Two times a day (BID) | ORAL | 1 refills | Status: DC

## 2022-07-20 NOTE — Assessment & Plan Note (Signed)
Chronic and uncontrolled No longer pregnant and would like to switch off of labetalol Was given 4 days's of Lasix by OB, but no potassium supplement and potassium was not checked We will recheck metabolic panel today Discussed that HCTZ and enalapril are contraindicated in breast-feeding She previously had lip swelling from amlodipine and is allergic Trial of hydralazine twice daily

## 2022-07-20 NOTE — Assessment & Plan Note (Signed)
Recommend low carb diet °Recheck A1c  °

## 2022-07-21 LAB — COMPREHENSIVE METABOLIC PANEL
ALT: 24 IU/L (ref 0–32)
AST: 21 IU/L (ref 0–40)
Albumin/Globulin Ratio: 1.5 (ref 1.2–2.2)
Albumin: 4.1 g/dL (ref 3.9–4.9)
Alkaline Phosphatase: 102 IU/L (ref 44–121)
BUN/Creatinine Ratio: 15 (ref 9–23)
BUN: 12 mg/dL (ref 6–20)
Bilirubin Total: <0.2 mg/dL (ref 0.0–1.2)
CO2: 22 mmol/L (ref 20–29)
Calcium: 9.6 mg/dL (ref 8.7–10.2)
Chloride: 104 mmol/L (ref 96–106)
Creatinine, Ser: 0.8 mg/dL (ref 0.57–1.00)
Globulin, Total: 2.7 g/dL (ref 1.5–4.5)
Glucose: 70 mg/dL (ref 70–99)
Potassium: 4.3 mmol/L (ref 3.5–5.2)
Sodium: 141 mmol/L (ref 134–144)
Total Protein: 6.8 g/dL (ref 6.0–8.5)
eGFR: 101 mL/min/{1.73_m2} (ref >59)

## 2022-07-21 LAB — HEMOGLOBIN A1C
Est. average glucose Bld gHb Est-mCnc: 114 mg/dL
Hgb A1c MFr Bld: 5.6 % (ref 4.8–5.6)

## 2022-07-21 LAB — LIPID PANEL
Chol/HDL Ratio: 2.6 ratio (ref 0.0–4.4)
Cholesterol, Total: 213 mg/dL — ABNORMAL HIGH (ref 100–199)
HDL: 81 mg/dL (ref >39)
LDL Chol Calc (NIH): 111 mg/dL — ABNORMAL HIGH (ref 0–99)
Triglycerides: 119 mg/dL (ref 0–149)
VLDL Cholesterol Cal: 21 mg/dL (ref 5–40)

## 2022-07-22 ENCOUNTER — Encounter: Payer: Self-pay | Admitting: Family Medicine

## 2022-07-24 NOTE — H&P (Signed)
Cynthia Crawford is a 31 y.o. female presenting for Induction of Labor for Chronic Hypertension. Has been waiting at home and just got called in.  Denies headache or visual changes  . OB History as of 07/14/2022     Gravida  4   Para  4   Term  4   Preterm  0   AB  0   Living  4      SAB  0   IAB  0   Ectopic  0   Multiple  0   Live Births  4          Past Medical History:  Diagnosis Date   Asthma    Contact dermatitis due to soap 11/20/2017   GERD (gastroesophageal reflux disease) 07/13/2021   History of gestational hypertension    History of kidney stones 01/16/2019   Hypertension 08/29/2017   Intrinsic eczema 08/05/2018   Migraine 09/25/2011   Past Surgical History:  Procedure Laterality Date   TONSILLECTOMY     Family History: family history includes Asthma in her mother; Diabetes in her father; Hypertension in her maternal grandmother. Social History:  reports that she has never smoked. She has never used smokeless tobacco. She reports that she does not drink alcohol and does not use drugs.     Maternal Diabetes: No Genetic Screening: Normal Maternal Ultrasounds/Referrals: Normal Fetal Ultrasounds or other Referrals:  None Maternal Substance Abuse:  No Significant Maternal Medications:  None Significant Maternal Lab Results:  Group B Strep negative Number of Prenatal Visits:greater than 3 verified prenatal visits Other Comments:  None  Review of Systems  Constitutional:  Negative for chills and fever.  Eyes:  Negative for visual disturbance.  Respiratory:  Negative for shortness of breath.   Genitourinary:  Negative for pelvic pain.  Neurological:  Negative for headaches.   Maternal Medical History:  Reason for admission: IOL for Hypertension   Contractions: Frequency: irregular.   Perceived severity is mild.   Fetal activity: Perceived fetal activity is normal.   Last perceived fetal movement was within the past hour.    Prenatal complications: PIH.   No placental abnormality.   Prenatal Complications - Diabetes: none.   Dilation: 10 Effacement (%): 100 Station: Plus 2 Exam by:: Donette Larry, CNM Blood pressure 134/81, pulse (!) 57, temperature 98.2 F (36.8 C), temperature source Oral, resp. rate 20, height 5\' 3"  (1.6 m), weight 90.7 kg, last menstrual period 10/22/2021, SpO2 100 %, unknown if currently breastfeeding. Maternal Exam:  Uterine Assessment: Contraction strength is mild.  Contraction frequency is irregular.  Abdomen: Patient reports no abdominal tenderness. Fetal presentation: vertex Introitus: Normal vulva. Normal vagina.  Pelvis: adequate for delivery.   Cervix: Cervix evaluated by digital exam.     Fetal Exam Fetal Monitor Review: Mode: ultrasound.   Baseline rate: 140.  Variability: moderate (6-25 bpm).   Pattern: accelerations present and no decelerations.   Fetal State Assessment: Category I - tracings are normal.   Physical Exam Constitutional:      General: She is not in acute distress.    Appearance: She is not ill-appearing or toxic-appearing.  HENT:     Head: Normocephalic.  Cardiovascular:     Rate and Rhythm: Normal rate.  Pulmonary:     Effort: Pulmonary effort is normal.  Abdominal:     Tenderness: There is no abdominal tenderness. There is no guarding.  Genitourinary:    General: Normal vulva.  Musculoskeletal:  General: Normal range of motion.     Cervical back: Normal range of motion.  Skin:    General: Skin is warm and dry.  Neurological:     General: No focal deficit present.     Mental Status: She is alert.  Psychiatric:        Mood and Affect: Mood normal.    Cervix 1-2/20%/Balltot/vertex  Prenatal labs: ABO, Rh: --/--/O POS (10/28 0409) Antibody: NEG (10/28 0409) Rubella: 3.21 (04/18 1548) RPR: NON REACTIVE (10/28 0409)  HBsAg: Negative (04/18 1548)  HIV: Non Reactive (08/30 0929)  GBS: Negative/-- (10/18 1029)    Assessment/Plan: Single IUP at  [redacted]w[redacted]d Chronic Hypertension  Admit to Labor and Delivery Routine orders Cytotec per protocol   Hansel Feinstein 07/24/2022, 8:26 PM

## 2022-07-25 ENCOUNTER — Telehealth (HOSPITAL_COMMUNITY): Payer: Self-pay | Admitting: *Deleted

## 2022-07-25 ENCOUNTER — Ambulatory Visit: Payer: Self-pay

## 2022-07-25 ENCOUNTER — Encounter: Payer: Self-pay | Admitting: Family Medicine

## 2022-07-25 ENCOUNTER — Encounter: Payer: 59 | Admitting: Family Medicine

## 2022-07-25 NOTE — Telephone Encounter (Signed)
Patient voiced no questions or concerns regarding her health at this time. EPDS=0. Patient voiced no questions or concerns regarding infant at this time. Patient reports infant sleeps in a bassinet on her back. RN reviewed ABCs of safe sleep. Patient verbalized understanding. Patient informed about hospital's virtual postpartum classes and support groups. Declined email information at this time. Deforest Hoyles, RN, 07/25/22, [phone]

## 2022-07-25 NOTE — Telephone Encounter (Signed)
  Chief Complaint: medication problem Symptoms: BP elevated, HA, swelling in hands and feet, N/V Frequency: several days Pertinent Negatives: NA Disposition: [] ED /[] Urgent Care (no appt availability in office) / [x] Appointment(In office/virtual)/ []  [place] [facility]/ [] Home Care/ [] Refused Recommended Disposition /[]  Mobile Bus/ []  Follow-up with PCP Additional Notes: pt has sent Mychart message to provider but also wanted to discuss with NT. Pt states she has been taking medication and not seeing any improvement in BP and having SE from the medication. I was able to schedule pt for VV tomorrow at 1440 and advised pt of red flags when she would need to go to ED. Pt verbalized understanding.   Reason for Disposition  [1] Caller has URGENT medicine question about med that PCP or specialist prescribed AND [2] triager unable to answer question  Answer Assessment - Initial Assessment Questions 1. NAME of MEDICINE: "What medicine(s) are you calling about?"     hydralazine 2. QUESTION: "What is your question?" (e.g., double dose of medicine, side effect)     Not seeing an improvement in BP and having SE from medication.  3. PRESCRIBER: "Who prescribed the medicine?" Reason: if prescribed by specialist, call should be referred to that group.     Dr. 4. SYMPTOMS: "Do you have any symptoms?" If Yes, ask: "What symptoms are you having?"  "How bad are the symptoms (e.g., mild, moderate, severe)     Nausea, vomiting, HA, swelling in hands  Protocols used: Medication Question Call-A-AH

## 2022-07-26 ENCOUNTER — Ambulatory Visit: Payer: 59 | Admitting: Family Medicine

## 2022-07-26 MED ORDER — LABETALOL HCL 100 MG PO TABS: 100.0000 mg | ORAL_TABLET | Freq: Two times a day (BID) | ORAL | 1 refills | Status: DC

## 2022-07-29 ENCOUNTER — Inpatient Hospital Stay (HOSPITAL_COMMUNITY): Admit: 2022-07-29 | Payer: 59 | Admitting: Obstetrics & Gynecology

## 2022-08-02 ENCOUNTER — Encounter: Payer: 59 | Admitting: Advanced Practice Midwife

## 2022-08-08 ENCOUNTER — Encounter: Payer: 59 | Admitting: Family Medicine

## 2022-08-17 NOTE — Progress Notes (Unsigned)
I,Tais Koestner S Ryaan Vanwagoner,acting as a Education administrator for Lavon Paganini, MD.,have documented all relevant documentation on the behalf of Lavon Paganini, MD,as directed by  Lavon Paganini, MD while in the presence of Lavon Paganini, MD.   MyChart Video Visit    Virtual Visit via Video Note   This format is felt to be most appropriate for this patient at this time. Physical exam was limited by quality of the video and audio technology used for the visit.   Patient location: *** Provider location: ***  I discussed the limitations of evaluation and management by telemedicine and the availability of in person appointments. The patient expressed understanding and agreed to proceed.  Patient: Cynthia Crawford   DOB: October 11, 1990   31 y.o. Female  MRN: [id number] Visit Date: 08/20/2022  Today's healthcare provider: Lavon Paganini, MD   No chief complaint on file.  Subjective    HPI  Follow up for hypertension  The patient was last seen for this 1 months ago. Changes made at last visit include start hydralazine BID.  She reports {excellent/good/fair/poor:19665} compliance with treatment. She feels that condition is {improved/worse/unchanged:3041574}. She {is/is not:21021397} having side effects. ***  BP Readings from Last 3 Encounters:  07/20/22 (!) 136/90  07/15/22 134/81  07/11/22 125/77   -----------------------------------------------------------------------------------------  Medications: Outpatient Medications Prior to Visit  Medication Sig   cetirizine (ZYRTEC) 10 MG tablet Take 10 mg by mouth daily.   ferrous sulfate (FERROUSUL) 325 (65 FE) MG tablet Take 1 tablet (325 mg total) by mouth every other day. (Patient not taking: Reported on 07/20/2022)   labetalol (NORMODYNE) 100 MG tablet Take 1 tablet (100 mg total) by mouth 2 (two) times daily.   Prenatal Vit-Fe Fumarate-FA (PRENATAL PO) Take 1 tablet by mouth daily.   No facility-administered medications prior  to visit.    Review of Systems  Constitutional:  Negative for appetite change.  Eyes:  Negative for visual disturbance.  Respiratory:  Negative for chest tightness and shortness of breath.   Cardiovascular:  Negative for chest pain, palpitations and leg swelling.  Gastrointestinal:  Negative for abdominal pain, nausea and vomiting.  Neurological:  Negative for dizziness, light-headedness and headaches.    Last metabolic panel Lab Results  Component Value Date   GLUCOSE 70 07/20/2022   NA 141 07/20/2022   K 4.3 07/20/2022   CL 104 07/20/2022   CO2 22 07/20/2022   BUN 12 07/20/2022   CREATININE 0.80 07/20/2022   EGFR 101 07/20/2022   CALCIUM 9.6 07/20/2022   PROT 6.8 07/20/2022   ALBUMIN 4.1 07/20/2022   LABGLOB 2.7 07/20/2022   AGRATIO 1.5 07/20/2022   BILITOT <0.2 07/20/2022   ALKPHOS 102 07/20/2022   AST 21 07/20/2022   ALT 24 07/20/2022   ANIONGAP 9 10/09/2021       Objective    There were no vitals taken for this visit.  BP Readings from Last 3 Encounters:  07/20/22 (!) 136/90  07/15/22 134/81  07/11/22 125/77   Wt Readings from Last 3 Encounters:  07/20/22 184 lb 12.8 oz (83.8 kg)  07/14/22 200 lb (90.7 kg)  07/11/22 199 lb (90.3 kg)       Physical Exam     Assessment & Plan     ***  No follow-ups on file.     I discussed the assessment and treatment plan with the patient. The patient was provided an opportunity to ask questions and all were answered. The patient agreed with the plan and demonstrated an understanding  of the instructions.   The patient was advised to call back or seek an in-person evaluation if the symptoms worsen or if the condition fails to improve as anticipated.  I provided *** minutes of non-face-to-face time during this encounter.  {provider attestation***:1}  Lavon Paganini, MD [facility] [phone] (phone) [phone] (fax)  [place]

## 2022-08-20 ENCOUNTER — Encounter: Payer: Self-pay | Admitting: Family Medicine

## 2022-08-20 ENCOUNTER — Telehealth (INDEPENDENT_AMBULATORY_CARE_PROVIDER_SITE_OTHER): Payer: 59 | Admitting: Family Medicine

## 2022-08-20 VITALS — BP 128/70

## 2022-08-20 DIAGNOSIS — I10 Essential (primary) hypertension: Secondary | ICD-10-CM | POA: Diagnosis not present

## 2022-08-20 MED ORDER — LABETALOL HCL 100 MG PO TABS: 100.0000 mg | ORAL_TABLET | Freq: Two times a day (BID) | ORAL | 1 refills | Status: DC

## 2022-08-20 NOTE — Assessment & Plan Note (Signed)
Chronic and well controlled Continue current medications - labetalol Plan to switch back to HCTZ after she is done breastfeeding - she is not planning on any other pregnancies Reviewed metabolic panel F/u in 6 months

## 2022-08-30 ENCOUNTER — Ambulatory Visit (INDEPENDENT_AMBULATORY_CARE_PROVIDER_SITE_OTHER): Payer: 59 | Admitting: Advanced Practice Midwife

## 2022-08-30 ENCOUNTER — Encounter: Payer: Self-pay | Admitting: Advanced Practice Midwife

## 2022-08-30 DIAGNOSIS — I10 Essential (primary) hypertension: Secondary | ICD-10-CM

## 2022-08-30 NOTE — Progress Notes (Signed)
    Post Partum Visit Note  Cynthia Crawford is a 31 y.o. [phone] female who presents for a postpartum visit. She is 6 weeks postpartum following a normal spontaneous vaginal delivery.  I have fully reviewed the prenatal and intrapartum course. The delivery was at 37 gestational weeks.  Anesthesia: epidural. Postpartum course has been unremarkable. Baby is doing well. Baby is feeding by breast. Bleeding no bleeding. Bowel function is normal. Bladder function is normal. Patient is not sexually active. Contraception method is none. Postpartum depression screening: negative.   The pregnancy intention screening data noted above was reviewed. Potential methods of contraception were discussed. The patient elected to proceed with No data recorded.    Health Maintenance Due  Topic Date Due   COVID-19 Vaccine (3 - 2023-24 season) 05/18/2022    The following portions of the patient's history were reviewed and updated as appropriate: allergies, current medications, past family history, past medical history, past social history, past surgical history, and problem list.  Review of Systems A comprehensive review of systems was negative.  Objective:  There were no vitals taken for this visit.   General:  alert, cooperative, appears stated age, and no distress   Breasts:  not indicated  Lungs: clear to auscultation bilaterally  Heart:  normal apical impulse  Abdomen: soft, non-tender; bowel sounds normal; no masses,  no organomegaly   GU exam:  not indicated       Assessment:   Hx vaginal delivery Chronic HTN 3. Normal postpartum exam.   Plan:   Essential components of care per ACOG recommendations:  1.  Mood and well being: Patient with negative depression screening today. Reviewed local resources for support.  - Patient tobacco use? No.   - hx of drug use? No.    2. Infant care and feeding:  -Patient currently breastmilk feeding? Yes. Reviewed importance of draining breast  regularly to support lactation.  -Social determinants of health (SDOH) reviewed in EPIC. No concerns  3. Sexuality, contraception and birth spacing - Patient desire NFP  4. Sleep and fatigue -Encouraged family/partner/community support of 4 hrs of uninterrupted sleep to help with mood and fatigue  5. Physical Recovery  - Discussed patients delivery and complications. She describes her labor as mixed. - Patient had a Vaginal problems after delivery including cord avulsion . Patient had an intact perineum.  - Patient has urinary incontinence? No. - Patient is safe to resume physical and sexual activity  6.  Health Maintenance - HM due items addressed Yes - Last pap smear  Diagnosis  Date Value Ref Range Status  07/04/2021   Final   - Negative for intraepithelial lesion or malignancy (NILM)   Pap smear not done at today's visit.  -Breast Cancer screening indicated? No.   7. Chronic Disease/Pregnancy Condition follow up: Hypertension  - PCP managed BP check and medication management postpartum per patient preference  Clayton Bibles, MSA, MSN, CNM Certified Nurse Midwife, Biochemist, clinical for [employer], [facility]

## 2022-10-05 DIAGNOSIS — M722 Plantar fascial fibromatosis: Secondary | ICD-10-CM | POA: Diagnosis not present

## 2022-10-05 DIAGNOSIS — M7672 Peroneal tendinitis, left leg: Secondary | ICD-10-CM | POA: Diagnosis not present

## 2022-12-06 ENCOUNTER — Encounter: Payer: Self-pay | Admitting: Advanced Practice Midwife

## 2023-02-18 NOTE — Progress Notes (Unsigned)
   I,Kimaya Whitlatch S Shyia Fillingim,acting as a Neurosurgeon for Shirlee Latch, MD.,have documented all relevant documentation on the behalf of Shirlee Latch, MD,as directed by  Shirlee Latch, MD while in the presence of Shirlee Latch, MD.     Established patient visit   Patient: Cynthia Crawford   DOB: 21-Jul-1991   32 y.o. Female  MRN: [id number] Visit Date: 02/19/2023  Today's healthcare provider: Shirlee Latch, MD   No chief complaint on file.  Subjective    HPI  Hypertension, follow-up  BP Readings from Last 3 Encounters:  08/30/22 131/83  08/20/22 128/70  07/20/22 (!) 136/90   Wt Readings from Last 3 Encounters:  08/30/22 173 lb (78.5 kg)  07/20/22 184 lb 12.8 oz (83.8 kg)  07/14/22 200 lb (90.7 kg)     She was last seen for hypertension 6 months ago.  BP at that visit was 131/83. Management since that visit includes switch back to HCTZ after she is done breastfeeding.  She reports {excellent/good/fair/poor:19665} compliance with treatment. She {is/is not:9024} having side effects. {document side effects if present:1}  Outside blood pressures are {***enter patient reported home BP readings, or 'not being checked':1}.  Pertinent labs Lab Results  Component Value Date   CHOL 213 (H) 07/20/2022   HDL 81 07/20/2022   LDLCALC 111 (H) 07/20/2022   TRIG 119 07/20/2022   CHOLHDL 2.6 07/20/2022   Lab Results  Component Value Date   NA 141 07/20/2022   K 4.3 07/20/2022   CREATININE 0.80 07/20/2022   EGFR 101 07/20/2022   GLUCOSE 70 07/20/2022   TSH 0.836 09/04/2021     The ASCVD Risk score (Arnett DK, et al., 2019) failed to calculate for the following reasons:   The 2019 ASCVD risk score is only valid for ages 51 to 78  ---------------------------------------------------------------------------------------------------   Medications: Outpatient Medications Prior to Visit  Medication Sig   cetirizine (ZYRTEC) 10 MG tablet Take 10 mg by mouth daily.    labetalol (NORMODYNE) 100 MG tablet Take 1 tablet (100 mg total) by mouth 2 (two) times daily.   Prenatal Vit-Fe Fumarate-FA (PRENATAL PO) Take 1 tablet by mouth daily.   No facility-administered medications prior to visit.    Review of Systems  Constitutional:  Negative for appetite change and fatigue.  Eyes:  Negative for visual disturbance.  Respiratory:  Negative for cough, chest tightness and shortness of breath.   Cardiovascular:  Negative for chest pain and leg swelling.  Neurological:  Negative for dizziness, light-headedness and headaches.    {Labs  Heme  Chem  Endocrine  Serology  Results Review (optional):23779}   Objective    There were no vitals taken for this visit. BP Readings from Last 3 Encounters:  08/30/22 131/83  08/20/22 128/70  07/20/22 (!) 136/90   Wt Readings from Last 3 Encounters:  08/30/22 173 lb (78.5 kg)  07/20/22 184 lb 12.8 oz (83.8 kg)  07/14/22 200 lb (90.7 kg)      Physical Exam  ***  No results found for any visits on 02/19/23.  Assessment & Plan     ***  No follow-ups on file.      {provider attestation***:1}   Shirlee Latch, MD  [facility] [phone] (phone) [phone] (fax)  [facility]

## 2023-02-19 ENCOUNTER — Encounter: Payer: Self-pay | Admitting: Family Medicine

## 2023-02-19 ENCOUNTER — Ambulatory Visit: Payer: 59 | Admitting: Family Medicine

## 2023-02-19 VITALS — BP 122/76 | HR 59 | Temp 97.8°F | Resp 12 | Ht 64.0 in | Wt 164.0 lb

## 2023-02-19 DIAGNOSIS — I10 Essential (primary) hypertension: Secondary | ICD-10-CM

## 2023-02-19 DIAGNOSIS — R7303 Prediabetes: Secondary | ICD-10-CM

## 2023-02-19 NOTE — Assessment & Plan Note (Signed)
Well controlled on Labetalol 100mg  BID. Discussed plan to switch to Hydrochlorothiazide after cessation of breastfeeding due to potential impact on milk supply. -Continue Labetalol 100mg  BID. - reviewed metabolic panel

## 2023-02-19 NOTE — Assessment & Plan Note (Signed)
Recommend low carb diet Recheck A1c at next visit 

## 2023-03-09 ENCOUNTER — Other Ambulatory Visit: Payer: Self-pay | Admitting: Family Medicine

## 2023-03-11 NOTE — Telephone Encounter (Signed)
Requested Prescriptions  Pending Prescriptions Disp Refills   labetalol (NORMODYNE) 100 MG tablet [Pharmacy Med Name: LABETALOL HCL 100 MG TABLET] 60 tablet 5    Sig: TAKE 1 TABLET BY MOUTH TWICE A DAY     Cardiovascular:  Beta Blockers Passed - 03/09/2023  8:38 AM      Passed - Last BP in normal range    BP Readings from Last 1 Encounters:  02/19/23 122/76         Passed - Last Heart Rate in normal range    Pulse Readings from Last 1 Encounters:  02/19/23 (!) 59         Passed - Valid encounter within last 6 months    Recent Outpatient Visits           2 weeks ago Benign essential hypertension   [facility] [place], Marzella Schlein, MD   6 months ago Benign essential hypertension   [facility] [place], Marzella Schlein, MD   7 months ago Benign essential hypertension   [facility] [place], Marzella Schlein, MD   1 year ago Exposure to strep throat   [facility] Merita Norton T, FNP   1 year ago Gastroesophageal reflux disease without esophagitis   [place] [facility] [place], Marzella Schlein, MD       Future Appointments             In 5 months [place], Marzella Schlein, MD [facility]

## 2023-04-02 ENCOUNTER — Encounter: Payer: Self-pay | Admitting: Family Medicine

## 2023-04-05 MED ORDER — HYDROCHLOROTHIAZIDE 25 MG PO TABS: 25.0000 mg | ORAL_TABLET | Freq: Every day | ORAL | 3 refills | Status: DC

## 2023-05-29 ENCOUNTER — Ambulatory Visit: Payer: Self-pay | Admitting: *Deleted

## 2023-05-29 NOTE — Telephone Encounter (Signed)
  Chief Complaint: SOB with exertion dizziness comes and goes  Symptoms: shortness of breath with exertion. Dizziness at times. Headache yesterday. No sx now. Right shoulder discomfort. Reports taking vit D supplements OTC and magnesium.  Frequency: yesterday did not check BP can not find monitor  Pertinent Negatives: Patient denies chest pain no difficulty breathing no fever. No weakness on either side of body  Disposition: [] ED /[] Urgent Care (no appt availability in office) / [x] Appointment(In office/virtual)/ []  [facility]/ [] Home Care/ [] Refused Recommended Disposition /[]  Mobile Bus/ []  Follow-up with PCP Additional Notes:   Appt scheduled for tomorrow. None available today. Recommended if sx return go to UC /ED. Patient concerned sx caused by BP med    Reason for Disposition  [1] MILD difficulty breathing (e.g., minimal/no SOB at rest, SOB with walking, pulse <100) AND [2] NEW-onset or WORSE than normal  Answer Assessment - Initial Assessment Questions 1. RESPIRATORY STATUS: "Describe your breathing?" (e.g., wheezing, shortness of breath, unable to speak, severe coughing)      Shortness of breath dizziness 2. ONSET: "When did this breathing problem begin?"      Yesterday  3. PATTERN "Does the difficult breathing come and go, or has it been constant since it started?"      Comes and goes  4. SEVERITY: "How bad is your breathing?" (e.g., mild, moderate, severe)    - MILD: No SOB at rest, mild SOB with walking, speaks normally in sentences, can lie down, no retractions, pulse < 100.    - MODERATE: SOB at rest, SOB with minimal exertion and prefers to sit, cannot lie down flat, speaks in phrases, mild retractions, audible wheezing, pulse 100-120.    - SEVERE: Very SOB at rest, speaks in single words, struggling to breathe, sitting hunched forward, retractions, pulse > 120      SOB with exertion dizziness at times 5. RECURRENT SYMPTOM: "Have you had difficulty  breathing before?" If Yes, ask: "When was the last time?" and "What happened that time?"      Yes was possible anxiety  6. CARDIAC HISTORY: "Do you have any history of heart disease?" (e.g., heart attack, angina, bypass surgery, angioplasty)      na 7. LUNG HISTORY: "Do you have any history of lung disease?"  (e.g., pulmonary embolus, asthma, emphysema)     na 8. CAUSE: "What do you think is causing the breathing problem?"      na 9. OTHER SYMPTOMS: "Do you have any other symptoms? (e.g., dizziness, runny nose, cough, chest pain, fever)     Dizziness headache SOB with exertion 10. O2 SATURATION MONITOR:  "Do you use an oxygen saturation monitor (pulse oximeter) at home?" If Yes, ask: "What is your reading (oxygen level) today?" "What is your usual oxygen saturation reading?" (e.g., 95%)       na 11. PREGNANCY: "Is there any chance you are pregnant?" "When was your last menstrual period?"       Na  12. TRAVEL: "Have you traveled out of the country in the last month?" (e.g., travel history, exposures)       na  Protocols used: Breathing Difficulty-A-AH

## 2023-05-30 ENCOUNTER — Encounter: Payer: Self-pay | Admitting: Family Medicine

## 2023-05-30 ENCOUNTER — Ambulatory Visit: Payer: 59 | Admitting: Family Medicine

## 2023-05-30 VITALS — BP 140/100 | HR 66 | Ht 64.0 in | Wt 154.0 lb

## 2023-05-30 DIAGNOSIS — I10 Essential (primary) hypertension: Secondary | ICD-10-CM | POA: Diagnosis not present

## 2023-05-30 DIAGNOSIS — R7303 Prediabetes: Secondary | ICD-10-CM

## 2023-05-30 DIAGNOSIS — E663 Overweight: Secondary | ICD-10-CM | POA: Diagnosis not present

## 2023-05-30 MED ORDER — AMLODIPINE BESYLATE 5 MG PO TABS: 5.0000 mg | ORAL_TABLET | Freq: Every day | ORAL | 11 refills | Status: DC

## 2023-05-30 NOTE — Assessment & Plan Note (Signed)
Chronic, previously well controlled Repeat A1c Continue to recommend balanced, lower carb meals. Smaller meal size, adding snacks. Choosing water as drink of choice and increasing purposeful exercise. Controlled with diet and exercise

## 2023-05-30 NOTE — Progress Notes (Signed)
Established patient visit   Patient: Cynthia Crawford   DOB: 02-23-1991   32 y.o. Female  MRN: [id number] Visit Date: 05/30/2023  Today's healthcare provider: Jacky Kindle, FNP  Introduced to nurse practitioner role and practice setting.  All questions answered.  Discussed provider/patient relationship and expectations.  Subjective    HPI HPI     Medical Management of Chronic Issues    Additional comments: Follow up on BP, Dizziness and exertion started on Tuesday, no new activities, noticed when walking up and down stairs, possible side effects from taking hydrochlorothiazide       Last edited by Rolly Salter, CMA on 05/30/2023  9:21 AM.      Medications: Outpatient Medications Prior to Visit  Medication Sig   cetirizine (ZYRTEC) 10 MG tablet Take 10 mg by mouth daily.   hydrochlorothiazide (HYDRODIURIL) 25 MG tablet Take 1 tablet (25 mg total) by mouth daily.   Prenatal Vit-Fe Fumarate-FA (PRENATAL PO) Take 1 tablet by mouth daily. (Patient not taking: Reported on 05/30/2023)   No facility-administered medications prior to visit.     Objective    BP (!) 140/100   Pulse 66   Ht 5\' 4"  (1.626 m)   Wt 154 lb (69.9 kg)   Breastfeeding No   BMI 26.43 kg/m   Physical Exam Vitals and nursing note reviewed.  Constitutional:      General: She is not in acute distress.    Appearance: Normal appearance. She is overweight. She is not ill-appearing, toxic-appearing or diaphoretic.  HENT:     Head: Normocephalic and atraumatic.  Cardiovascular:     Rate and Rhythm: Normal rate and regular rhythm.     Pulses: Normal pulses.     Heart sounds: Normal heart sounds. No murmur heard.    No friction rub. No gallop.  Pulmonary:     Effort: Pulmonary effort is normal. No respiratory distress.     Breath sounds: Normal breath sounds. No stridor. No wheezing, rhonchi or rales.  Chest:     Chest wall: No tenderness.  Musculoskeletal:        General: No swelling,  tenderness, deformity or signs of injury. Normal range of motion.     Right lower leg: No edema.     Left lower leg: No edema.  Skin:    General: Skin is warm and dry.     Capillary Refill: Capillary refill takes less than 2 seconds.     Coloration: Skin is not jaundiced or pale.     Findings: No bruising, erythema, lesion or rash.  Neurological:     General: No focal deficit present.     Mental Status: She is alert and oriented to person, place, and time. Mental status is at baseline.     Cranial Nerves: No cranial nerve deficit.     Sensory: No sensory deficit.     Motor: No weakness.     Coordination: Coordination normal.  Psychiatric:        Mood and Affect: Mood normal.        Behavior: Behavior normal.        Thought Content: Thought content normal.        Judgment: Judgment normal.     No results found for any visits on 05/30/23.  Assessment & Plan     Problem List Items Addressed This Visit       Cardiovascular and Mediastinum   Benign essential hypertension - Primary    Chronic, remains  elevated Reports complications from use of hydrochlorothiazide due to concerns for electrolyte derangement and poor PO intake Will stop hydrochlorothiazide and transition to 5 mg norvasc to assist given previous complications with ACE; plan to avoid both ACE and ARB Pt previously on BB given recurrent pregnancies; no planned pregnancies at this time- continue to monitor Goal of 139/89 or less Repeat CBC, CMP, TSH      Relevant Medications   amLODipine (NORVASC) 5 MG tablet   Other Relevant Orders   CBC with Differential/Platelet   Comprehensive Metabolic Panel (CMET)   TSH   Hemoglobin A1c   B12   Vitamin D (25 hydroxy)     Other   Overweight    Chronic, Body mass index is 26.43 kg/m. No concerns Repeat LP, TSH, vitamin panel      Relevant Orders   CBC with Differential/Platelet   Comprehensive Metabolic Panel (CMET)   TSH   Hemoglobin A1c   B12   Vitamin D (25  hydroxy)   Prediabetes    Chronic, previously well controlled Repeat A1c Continue to recommend balanced, lower carb meals. Smaller meal size, adding snacks. Choosing water as drink of choice and increasing purposeful exercise. Controlled with diet and exercise      Relevant Orders   Hemoglobin A1c   Return in about 4 weeks (around 06/27/2023) for HTN management.     Leilani Merl, FNP, have reviewed all documentation for this visit. The documentation on 05/30/23 for the exam, diagnosis, procedures, and orders are all accurate and complete.  Jacky Kindle, FNP  [facility] [phone] (phone) [phone] (fax)  [facility]

## 2023-05-30 NOTE — Assessment & Plan Note (Signed)
Chronic, Body mass index is 26.43 kg/m. No concerns Repeat LP, TSH, vitamin panel

## 2023-05-30 NOTE — Assessment & Plan Note (Addendum)
Chronic, remains elevated Reports complications from use of hydrochlorothiazide due to concerns for electrolyte derangement and poor PO intake Will stop hydrochlorothiazide and transition to 5 mg norvasc to assist given previous complications with ACE; plan to avoid both ACE and ARB Pt previously on BB given recurrent pregnancies; no planned pregnancies at this time- continue to monitor Goal of 139/89 or less Repeat CBC, CMP, TSH

## 2023-05-31 ENCOUNTER — Telehealth: Payer: Self-pay

## 2023-05-31 LAB — COMPREHENSIVE METABOLIC PANEL
ALT: 10 IU/L (ref 0–32)
AST: 14 IU/L (ref 0–40)
Albumin: 4.7 g/dL (ref 3.9–4.9)
Alkaline Phosphatase: 69 IU/L (ref 44–121)
BUN/Creatinine Ratio: 17 (ref 9–23)
BUN: 13 mg/dL (ref 6–20)
Bilirubin Total: 0.3 mg/dL (ref 0.0–1.2)
CO2: 24 mmol/L (ref 20–29)
Calcium: 9.8 mg/dL (ref 8.7–10.2)
Chloride: 98 mmol/L (ref 96–106)
Creatinine, Ser: 0.75 mg/dL (ref 0.57–1.00)
Globulin, Total: 2.6 g/dL (ref 1.5–4.5)
Glucose: 84 mg/dL (ref 70–99)
Potassium: 4.1 mmol/L (ref 3.5–5.2)
Sodium: 136 mmol/L (ref 134–144)
Total Protein: 7.3 g/dL (ref 6.0–8.5)
eGFR: 108 mL/min/{1.73_m2} (ref >59)

## 2023-05-31 LAB — CBC WITH DIFFERENTIAL/PLATELET
Basophils Absolute: 0 10*3/uL (ref 0.0–0.2)
Basos: 1 %
EOS (ABSOLUTE): 0.1 10*3/uL (ref 0.0–0.4)
Eos: 1 %
Hematocrit: 40.5 % (ref 34.0–46.6)
Hemoglobin: 13.2 g/dL (ref 11.1–15.9)
Immature Grans (Abs): 0 10*3/uL (ref 0.0–0.1)
Immature Granulocytes: 0 %
Lymphocytes Absolute: 1.4 10*3/uL (ref 0.7–3.1)
Lymphs: 27 %
MCH: 29.1 pg (ref 26.6–33.0)
MCHC: 32.6 g/dL (ref 31.5–35.7)
MCV: 89 fL (ref 79–97)
Monocytes Absolute: 0.4 10*3/uL (ref 0.1–0.9)
Monocytes: 7 %
Neutrophils Absolute: 3.5 10*3/uL (ref 1.4–7.0)
Neutrophils: 64 %
Platelets: 190 10*3/uL (ref 150–450)
RBC: 4.54 x10E6/uL (ref 3.77–5.28)
RDW: 12 % (ref 11.7–15.4)
WBC: 5.4 10*3/uL (ref 3.4–10.8)

## 2023-05-31 LAB — HEMOGLOBIN A1C
Est. average glucose Bld gHb Est-mCnc: 120 mg/dL
Hgb A1c MFr Bld: 5.8 % — ABNORMAL HIGH (ref 4.8–5.6)

## 2023-05-31 LAB — VITAMIN B12: Vitamin B-12: 730 pg/mL (ref 232–1245)

## 2023-05-31 LAB — VITAMIN D 25 HYDROXY (VIT D DEFICIENCY, FRACTURES): Vit D, 25-Hydroxy: 42.5 ng/mL (ref 30.0–100.0)

## 2023-05-31 LAB — TSH: TSH: 0.777 u[IU]/mL (ref 0.450–4.500)

## 2023-05-31 NOTE — Telephone Encounter (Signed)
Pt given lab results per notes of Robynn Pane on 05/31/23. Pt verbalized understanding. Patient asked if the increase in her A1C could be the cause of her symptoms? Advised patient that I would forward her question to [place] at this time. Patient stated she can send her a MyChart message to answer her question.  Increase pre-diabetes range for past 3 months now at 5.8%. Continue to recommend balanced, lower carb meals. Smaller meal size, adding snacks. Choosing water as drink of choice and increasing purposeful exercise.  Written by Jacky Kindle, FNP on 05/31/2023 11:56 AM ED

## 2023-06-03 ENCOUNTER — Encounter: Payer: Self-pay | Admitting: Family Medicine

## 2023-06-21 ENCOUNTER — Encounter: Payer: Self-pay | Admitting: Family Medicine

## 2023-06-21 ENCOUNTER — Ambulatory Visit: Payer: 59 | Admitting: Family Medicine

## 2023-06-21 VITALS — BP 129/85 | HR 66 | Temp 98.3°F | Resp 16 | Wt 154.0 lb

## 2023-06-21 DIAGNOSIS — Z23 Encounter for immunization: Secondary | ICD-10-CM | POA: Diagnosis not present

## 2023-06-21 DIAGNOSIS — I10 Essential (primary) hypertension: Secondary | ICD-10-CM | POA: Diagnosis not present

## 2023-06-21 DIAGNOSIS — R7303 Prediabetes: Secondary | ICD-10-CM

## 2023-06-21 DIAGNOSIS — E663 Overweight: Secondary | ICD-10-CM

## 2023-06-21 MED ORDER — AMLODIPINE BESYLATE 5 MG PO TABS: 5.0000 mg | ORAL_TABLET | Freq: Every day | ORAL | 3 refills | Status: DC

## 2023-06-21 NOTE — Progress Notes (Signed)
Established patient visit   Patient: Cynthia Crawford   DOB: May 05, 1991   32 y.o. Female  MRN: [id number] Visit Date: 06/21/2023  Today's healthcare provider: Jacky Kindle, FNP  Introduced to nurse practitioner role and practice setting.  All questions answered.  Discussed provider/patient relationship and expectations.  Subjective    Hypertension   HPI     Hypertension    Additional comments: Patient was last seen about 3 weeks ago.  At that time she was instructed to stop the hydrochlorothiazide and was placed on Amlodipine.  She states she has been doing as instructed.  Her readings at home have been running 130's over 70's. Patient reports good compliance and tolerance of the medications.      Last edited by Adline Peals, CMA on 06/21/2023  9:12 AM.      The patient, with a history of hypertension, presents for a follow-up visit after a medication change three weeks ago. She reports no adverse effects since discontinuing hydrochlorothiazide which was stopped for complaints of dizziness and increased SOB when walking both up and down stairs and starting a new antihypertensive medication, Norvasc 5 mg once daily. Home blood pressure readings have been stable, with systolic readings in the 130s and diastolic readings in the 70s.  In addition, the patient's A1c showed a slight increase. She admits to skipping breakfast, eating a large lunch, and a small dinner. She expresses a willingness to improve her eating habits by incorporating breakfast and balancing their meals better.  Medications: Outpatient Medications Prior to Visit  Medication Sig Note   cetirizine (ZYRTEC) 10 MG tablet Take 10 mg by mouth daily.    [DISCONTINUED] amLODipine (NORVASC) 5 MG tablet Take 1 tablet (5 mg total) by mouth daily.    [DISCONTINUED] hydrochlorothiazide (HYDRODIURIL) 25 MG tablet Take 1 tablet (25 mg total) by mouth daily. 06/21/2023: dizziness   [DISCONTINUED] Prenatal Vit-Fe  Fumarate-FA (PRENATAL PO) Take 1 tablet by mouth daily. (Patient not taking: Reported on 05/30/2023)    No facility-administered medications prior to visit.     Objective    BP 129/85 (BP Location: Right Arm, Patient Position: Sitting, Cuff Size: Normal)   Pulse 66   Temp 98.3 F (36.8 C) (Oral)   Resp 16   Wt 154 lb (69.9 kg)   BMI 26.43 kg/m   Physical Exam Vitals and nursing note reviewed.  Constitutional:      General: She is not in acute distress.    Appearance: Normal appearance. She is overweight. She is not ill-appearing, toxic-appearing or diaphoretic.  HENT:     Head: Normocephalic and atraumatic.  Cardiovascular:     Rate and Rhythm: Normal rate and regular rhythm.     Pulses: Normal pulses.     Heart sounds: Normal heart sounds. No murmur heard.    No friction rub. No gallop.  Pulmonary:     Effort: Pulmonary effort is normal. No respiratory distress.     Breath sounds: Normal breath sounds. No stridor. No wheezing, rhonchi or rales.  Chest:     Chest wall: No tenderness.  Abdominal:     General: Bowel sounds are normal.     Palpations: Abdomen is soft.  Musculoskeletal:        General: No swelling, tenderness, deformity or signs of injury. Normal range of motion.     Right lower leg: No edema.     Left lower leg: No edema.  Skin:    General: Skin is warm and  dry.     Capillary Refill: Capillary refill takes less than 2 seconds.     Coloration: Skin is not jaundiced or pale.     Findings: No bruising, erythema, lesion or rash.  Neurological:     General: No focal deficit present.     Mental Status: She is alert and oriented to person, place, and time. Mental status is at baseline.     Cranial Nerves: No cranial nerve deficit.     Sensory: No sensory deficit.     Motor: No weakness.     Coordination: Coordination normal.  Psychiatric:        Mood and Affect: Mood normal.        Behavior: Behavior normal.        Thought Content: Thought content normal.         Judgment: Judgment normal.     No results found for any visits on 06/21/23.  Assessment & Plan    Hypertension Improved blood pressure control since switching from hydrochlorothiazide to new medication, Norvasc 5 mg, three weeks ago. Current readings are 129 systolic and 85 diastolic, with home readings stable at 130s over 70s. -Continue current medication regimen. -Send 90-day prescription; f/u in 1 year  Prediabetes Slight increase in A1C. Discussed importance of balanced meals and regular eating schedule. -Encourage regular meals, including breakfast, with balanced intake of vegetables, whole grains, and lean proteins. -Consider incorporating snacks balanced with protein.  General Health Maintenance -Physical exam unremarkable, no ankle or foot swelling. -Follow-up as needed.  Leilani Merl, FNP, have reviewed all documentation for this visit. The documentation on 06/21/23 for the exam, diagnosis, procedures, and orders are all accurate and complete.  Jacky Kindle, FNP  [facility] [phone] (phone) [phone] (fax)  [facility]

## 2023-08-08 ENCOUNTER — Ambulatory Visit: Payer: 59 | Admitting: Physician Assistant

## 2023-08-08 ENCOUNTER — Encounter: Payer: Self-pay | Admitting: Physician Assistant

## 2023-08-08 VITALS — BP 128/76 | HR 62 | Temp 98.0°F | Resp 14 | Ht 64.0 in | Wt 153.1 lb

## 2023-08-08 DIAGNOSIS — R197 Diarrhea, unspecified: Secondary | ICD-10-CM | POA: Diagnosis not present

## 2023-08-08 DIAGNOSIS — R002 Palpitations: Secondary | ICD-10-CM

## 2023-08-08 NOTE — Progress Notes (Signed)
Acute Office Visit   Patient: Cynthia Crawford   DOB: [birth year]   32 y.o. Female  MRN: [id number] Visit Date: 08/08/2023  Today's healthcare provider: Oswaldo Conroy Zimri Brennen, PA-C  Introduced myself to the patient as a Secondary school teacher and provided education on APPs in clinical practice.    Chief Complaint  Patient presents with   Hypertension    Noticed reading high yesterday called EMS 150/100, per EMS she was fine. Pt states yesterday had chest pain but it has cleared up . A month ago she started amlodipine   Subjective    HPI HPI     Hypertension    Additional comments: Noticed reading high yesterday called EMS 150/100, per EMS she was fine. Pt states yesterday had chest pain but it has cleared up . A month ago she started amlodipine      Last edited by Forde Radon, CMA on 08/08/2023 11:11 AM.      She reports she doesn't feel good today States yesterday was bad with palpitations, tingling in her feet, SOB, elevated BP  She denies recent sick contacts or travel   She also reports loose bowels since Sunday and a slight headache She reports she has been doing well on her Amlodipine but has had significant stress this week  She reports several instances of feeling overwhelmed especially yesterday   She reports she has had similar instances of feeling like this and had hyperkalemia   She reports she skips breakfast a lot and does not drink as much water as recommended  Has been increasing water intake today    Medications: Outpatient Medications Prior to Visit  Medication Sig   amLODipine (NORVASC) 5 MG tablet Take 1 tablet (5 mg total) by mouth daily.   cetirizine (ZYRTEC) 10 MG tablet Take 10 mg by mouth daily.   No facility-administered medications prior to visit.    Review of Systems  Constitutional:  Positive for chills and fatigue. Negative for fever.  Eyes:  Negative for visual disturbance.  Respiratory:  Positive for shortness of breath.    Cardiovascular:  Positive for chest pain and palpitations.  Gastrointestinal:  Positive for diarrhea. Negative for nausea and vomiting.  Neurological:  Negative for dizziness, light-headedness and headaches.  Psychiatric/Behavioral:  Negative for confusion and decreased concentration. The patient is nervous/anxious.         Objective    BP 128/76   Pulse 62   Temp 98 F (36.7 C) (Oral)   Resp 14   Ht 5\' 4"  (1.626 m)   Wt 153 lb 1.6 oz (69.4 kg)   SpO2 100%   BMI 26.28 kg/m     Physical Exam Vitals reviewed.  Constitutional:      General: She is awake.     Appearance: Normal appearance. She is well-developed and well-groomed.  HENT:     Head: Normocephalic and atraumatic.  Cardiovascular:     Rate and Rhythm: Normal rate and regular rhythm.     Pulses: Normal pulses.          Radial pulses are 2+ on the right side and 2+ on the left side.     Heart sounds: Normal heart sounds. No murmur heard.    No friction rub. No gallop.  Pulmonary:     Effort: Pulmonary effort is normal.     Breath sounds: Normal breath sounds. No decreased air movement. No decreased breath sounds, wheezing, rhonchi or rales.  Musculoskeletal:  Cervical back: Normal range of motion.     Right lower leg: No edema.     Left lower leg: No edema.  Neurological:     General: No focal deficit present.     Mental Status: She is alert and oriented to person, place, and time. Mental status is at baseline.     GCS: GCS eye subscore is 4. GCS verbal subscore is 5. GCS motor subscore is 6.  Psychiatric:        Attention and Perception: Attention and perception normal.        Mood and Affect: Mood and affect normal.        Speech: Speech normal.        Behavior: Behavior normal. Behavior is cooperative.        Thought Content: Thought content normal.        Cognition and Memory: Cognition normal.       No results found for any visits on 08/08/23.  Assessment & Plan      No follow-ups on file.       Problem List Items Addressed This Visit   None Visit Diagnoses     Palpitations    -  Primary   Relevant Orders   COMPLETE METABOLIC PANEL WITH GFR   TSH   Diarrhea, unspecified type       Relevant Orders   COMPLETE METABOLIC PANEL WITH GFR   CBC w/Diff/Platelet      Acute, new concerns Patient reports incidence of palpitations, shortness of breath, elevated blood pressure yesterday with ongoing fatigue and malaise today She reports that EMS was called to her home yesterday and told her that from a cardiac standpoint she was okay Physical exam is overall reassuring today.  Will get CBC, CMP, TSH given current symptoms of palpitations, diarrhea, fatigue She does report increased stress recently which may also be contributing to current symptoms Results to dictate further management For now recommend starting Pepcid and Pepto to assist with GI concerns.  I suspect this might also provide relief for the chest tightness and pain I recommend that she increases her water intake to at least 75 ounces of water per day and make sure that she is eating regularly Reviewed signs and symptoms requiring emergency room evaluation or in office follow-up Follow-up as needed for progressing or persistent symptoms   No follow-ups on file.   I, Celia Gibbons E Donne Baley, PA-C, have reviewed all documentation for this visit. The documentation on 08/08/23 for the exam, diagnosis, procedures, and orders are all accurate and complete.   Jacquelin Hawking, MHS, PA-C [facility]

## 2023-08-08 NOTE — Patient Instructions (Signed)
I recommend starting Pepcid and pepto to help with your stomach concerns. This may also provide some relief for the chest tightness and pain Please continue your current medications and drink at least 75 oz of water per day  Make sure you are eating regular meals and snacks throughout the day to avoid low blood sugars  If you continue to have symptoms or if they get worse, let us know or go to the ED for evaluation

## 2023-08-09 ENCOUNTER — Encounter: Payer: Self-pay | Admitting: Physician Assistant

## 2023-08-09 ENCOUNTER — Other Ambulatory Visit: Payer: Self-pay | Admitting: Family Medicine

## 2023-08-09 DIAGNOSIS — F430 Acute stress reaction: Secondary | ICD-10-CM | POA: Insufficient documentation

## 2023-08-09 LAB — COMPLETE METABOLIC PANEL WITH GFR
AG Ratio: 1.6 (calc) (ref 1.0–2.5)
ALT: 7 U/L (ref 6–29)
AST: 10 U/L (ref 10–30)
Albumin: 4.7 g/dL (ref 3.6–5.1)
Alkaline phosphatase (APISO): 55 U/L (ref 31–125)
BUN: 9 mg/dL (ref 7–25)
CO2: 27 mmol/L (ref 20–32)
Calcium: 9.6 mg/dL (ref 8.6–10.2)
Chloride: 104 mmol/L (ref 98–110)
Creat: 0.63 mg/dL (ref 0.50–0.97)
Globulin: 3 g/dL (ref 1.9–3.7)
Glucose, Bld: 74 mg/dL (ref 65–99)
Potassium: 4.2 mmol/L (ref 3.5–5.3)
Sodium: 139 mmol/L (ref 135–146)
Total Bilirubin: 0.3 mg/dL (ref 0.2–1.2)
Total Protein: 7.7 g/dL (ref 6.1–8.1)
eGFR: 121 mL/min/{1.73_m2} (ref >=60)

## 2023-08-09 LAB — CBC WITH DIFFERENTIAL/PLATELET
Absolute Lymphocytes: 1668 {cells}/uL (ref 850–3900)
Absolute Monocytes: 388 {cells}/uL (ref 200–950)
Basophils Absolute: 20 {cells}/uL (ref 0–200)
Basophils Relative: 0.4 %
Eosinophils Absolute: 82 {cells}/uL (ref 15–500)
Eosinophils Relative: 1.6 %
HCT: 37.5 % (ref 35.0–45.0)
Hemoglobin: 12.3 g/dL (ref 11.7–15.5)
MCH: 29.3 pg (ref 27.0–33.0)
MCHC: 32.8 g/dL (ref 32.0–36.0)
MCV: 89.3 fL (ref 80.0–100.0)
MPV: 12.9 fL — ABNORMAL HIGH (ref 7.5–12.5)
Monocytes Relative: 7.6 %
Neutro Abs: 2943 {cells}/uL (ref 1500–7800)
Neutrophils Relative %: 57.7 %
Platelets: 208 10*3/uL (ref 140–400)
RBC: 4.2 10*6/uL (ref 3.80–5.10)
RDW: 12.4 % (ref 11.0–15.0)
Total Lymphocyte: 32.7 %
WBC: 5.1 10*3/uL (ref 3.8–10.8)

## 2023-08-09 LAB — TSH: TSH: 1.15 m[IU]/L

## 2023-08-09 MED ORDER — ALPRAZOLAM 0.5 MG PO TABS: 0.5000 mg | ORAL_TABLET | Freq: Two times a day (BID) | ORAL | 0 refills | Status: DC | PRN

## 2023-08-09 NOTE — Telephone Encounter (Signed)
Patient was on the phone speaking with Zada Finders; Aida Raider, reception, asked if I would speak with patient was PA, who she saw yesterday for an acute appt, was unable to respond to patient's lab concerns and/or [facility] message. Questions answered; reassurance provided. I have personally cared for patient on/off for 2 years given acute and chronic issues. Low dose benzo provided to assist with her acute anxiety symptoms resulting in elevated HR, palpitations, elevated BP and concern for blood shunting results in GI distress and change in feeling in extremities.  Patient is aware to reach back out if new or concerning symptoms present.  Jacky Kindle, FNP  [facility] [address] [place], [place] [address] [phone] (phone) [phone] (fax) [facility]

## 2023-08-12 NOTE — Progress Notes (Signed)
Patient's labs were reviewed with another provider at her PCP clinic  Overall normal labs - no signs of anemia, thyroid issues, or electrolyte abnormalities.

## 2023-08-22 ENCOUNTER — Encounter: Payer: 59 | Admitting: Family Medicine

## 2023-08-30 DIAGNOSIS — Z833 Family history of diabetes mellitus: Secondary | ICD-10-CM | POA: Diagnosis not present

## 2023-08-30 DIAGNOSIS — R7303 Prediabetes: Secondary | ICD-10-CM | POA: Diagnosis not present

## 2023-08-30 DIAGNOSIS — Z882 Allergy status to sulfonamides status: Secondary | ICD-10-CM | POA: Diagnosis not present

## 2023-08-30 DIAGNOSIS — Z880 Allergy status to penicillin: Secondary | ICD-10-CM | POA: Diagnosis not present

## 2023-08-30 DIAGNOSIS — I10 Essential (primary) hypertension: Secondary | ICD-10-CM | POA: Diagnosis not present

## 2023-08-30 DIAGNOSIS — J45909 Unspecified asthma, uncomplicated: Secondary | ICD-10-CM | POA: Diagnosis not present

## 2023-09-03 ENCOUNTER — Ambulatory Visit: Payer: 59 | Admitting: Family Medicine

## 2023-09-03 ENCOUNTER — Encounter: Payer: Self-pay | Admitting: Family Medicine

## 2023-09-03 VITALS — BP 130/70 | HR 70 | Temp 98.8°F | Ht 64.0 in | Wt 149.0 lb

## 2023-09-03 DIAGNOSIS — I10 Essential (primary) hypertension: Secondary | ICD-10-CM

## 2023-09-03 DIAGNOSIS — R7303 Prediabetes: Secondary | ICD-10-CM

## 2023-09-03 MED ORDER — SPIRONOLACTONE 25 MG PO TABS: 12.5000 mg | ORAL_TABLET | Freq: Every day | ORAL | 0 refills | Status: DC

## 2023-09-03 NOTE — Assessment & Plan Note (Signed)
HbA1c is 5.8, indicating prediabetes. Discussed the importance of lifestyle modifications to prevent progression to diabetes. Explained that maintaining HbA1c under 6 is crucial to avoid diabetes. - Encourage dietary changes to reduce carbohydrate intake - Recommend regular exercise - Monitor HbA1c periodically

## 2023-09-03 NOTE — Progress Notes (Unsigned)
Established patient visit   Patient: Cynthia Crawford   DOB: 25-Jul-1991   32 y.o. Female  MRN: [id number] Visit Date: 09/03/2023  Today's healthcare provider: Ronnald Ramp, MD   Chief Complaint  Patient presents with   Hypertension    Patient presents to discuss possibility of changing her medication.  She feels the Amlodipine is causing her great anxiety and some palpitations.  She states she had an episode of anxiety so bad she had to call rescue.  She states she has never had any anxiety prior to use of the medication.  Patient states she is also more snappy than normal   Subjective     HPI     Hypertension    Additional comments: Patient presents to discuss possibility of changing her medication.  She feels the Amlodipine is causing her great anxiety and some palpitations.  She states she had an episode of anxiety so bad she had to call rescue.  She states she has never had any anxiety prior to use of the medication.  Patient states she is also more snappy than normal      Last edited by Adline Peals, CMA on 09/03/2023  3:21 PM.       Discussed the use of AI scribe software for clinical note transcription with the patient, who gave verbal consent to proceed.  History of Present Illness   The patient is a 32 year old female with a history of hypertension, who presents for a follow-up visit. The patient's current regimen includes amlodipine 5mg , which she reports has caused her extreme anxiety. Her blood pressure has been chronically uncontrolled, with a reading today of 146/99. Previously, the patient was on hydrochlorothiazide, which was discontinued, and had complications with an ACE inhibitor, leading to a recommendation to avoid ACEs or ARBs. The patient was also given beta blockers during her pregnancies.  The patient's husband, who was on the phone during the consultation, noted that he and the patient's mother have observed changes in the  patient's behavior that they find concerning. The patient reported that the amlodipine seemed to gradually increase her irritability and anxiousness over time. She also mentioned an episode where she felt extremely unwell after taking the medication, to the point where an ambulance was called.  The patient has been researching her symptoms and the medication, and she believes that her mood changes and feelings of depression may be linked to the amlodipine. She also reported experiencing nausea, vomiting, headache, and swelling while on hydralazine, which led to discontinuation of the medication.  The patient also mentioned a high mean platelet volume (MPV) reading from a recent blood test, which she was unsure about. She also noted that her blood glucose level was 5.8, which is in the prediabetes range, although she does not qualify as obese and does not consume a high-carb diet.  The patient's blood pressure was rechecked during the consultation and found to be 130/70. She reported that her blood pressure has been well-controlled on amlodipine, with readings at home of 126/76, but the side effects have been problematic. The patient does not recall any issues with swelling in her ankles, feet, or legs. She has not been on spironolactone before, but is open to trying it.         Past Medical History:  Diagnosis Date   Asthma    Contact dermatitis due to soap 11/20/2017   GERD (gastroesophageal reflux disease) 07/13/2021   History of gestational hypertension  History of kidney stones 01/16/2019   Hypertension 08/29/2017   Intrinsic eczema 08/05/2018   Migraine 09/25/2011    Medications: Outpatient Medications Prior to Visit  Medication Sig   amLODipine (NORVASC) 5 MG tablet Take 1 tablet (5 mg total) by mouth daily.   cetirizine (ZYRTEC) 10 MG tablet Take 10 mg by mouth daily.   ALPRAZolam (XANAX) 0.5 MG tablet Take 1 tablet (0.5 mg total) by mouth 2 (two) times daily as needed for  anxiety. (Patient not taking: Reported on 09/03/2023)   No facility-administered medications prior to visit.    Review of Systems  Last hemoglobin A1c Lab Results  Component Value Date   HGBA1C 5.8 (H) 05/30/2023     Chemistry      Component Value Date/Time   NA 139 08/08/2023 1149   NA 136 05/30/2023 0958   K 4.2 08/08/2023 1149   CL 104 08/08/2023 1149   CO2 27 08/08/2023 1149   BUN 9 08/08/2023 1149   BUN 13 05/30/2023 0958   CREATININE 0.63 08/08/2023 1149      Component Value Date/Time   CALCIUM 9.6 08/08/2023 1149   ALKPHOS 69 05/30/2023 0958   AST 10 08/08/2023 1149   ALT 7 08/08/2023 1149   BILITOT 0.3 08/08/2023 1149   BILITOT 0.3 05/30/2023 0958        {See past labs  Heme  Chem  Endocrine  Serology  Results Review (optional):1}   Objective    BP 130/70   Pulse 70   Temp 98.8 F (37.1 C) (Oral)   Ht 5\' 4"  (1.626 m)   Wt 149 lb (67.6 kg)   SpO2 100%   BMI 25.58 kg/m   BP Readings from Last 3 Encounters:  09/03/23 130/70  08/08/23 128/76  06/21/23 129/85   Wt Readings from Last 3 Encounters:  09/03/23 149 lb (67.6 kg)  08/08/23 153 lb 1.6 oz (69.4 kg)  06/21/23 154 lb (69.9 kg)    {See vitals history (optional):1}    Physical Exam  General: Alert, no acute distress Cardio: Normal S1 and S2, RRR, no r/m/g Pulm: CTAB, normal work of breathing Abdomen: Bowel sounds normal. Abdomen soft and non-tender.  Extremities: No peripheral edema.    No results found for any visits on 09/03/23.  Assessment & Plan     Problem List Items Addressed This Visit       Cardiovascular and Mediastinum   Benign essential hypertension - Primary   Chronic uncontrolled hypertension. Current regimen includes amlodipine 5 mg, which caused extreme anxiety, irritability, and mood changes. Previous medications include hydrochlorothiazide (ineffective) and enalapril (allergic reaction with lip swelling). Beta blockers were used during pregnancy but are not  preferred for long-term management. Blood pressure today is 146/99. Home readings on amlodipine were 126/76. Current reading in office is 130/70. Discussed risks and benefits of spironolactone, clonidine, and beta blockers. Spironolactone preferred due to potassium-sparing properties and fewer side effects. Informed about potential side effects including hyperkalemia and need for monitoring. - Discontinue amlodipine - Start spironolactone 12.5 mg once daily - Monitor blood pressure at home for one week - If blood pressure remains =140/90, adjust spironolactone dosage - Check potassium levels after two weeks of spironolactone - Provide DASH diet information - Encourage lifestyle modifications including reduced sodium intake, increased water intake, and regular exercise      Relevant Medications   spironolactone (ALDACTONE) 25 MG tablet              Return in about 2  weeks (around 09/17/2023) for HTN and labs .         Ronnald Ramp, MD  [facility] [phone] (phone) [phone] (fax)  [facility]

## 2023-09-03 NOTE — Assessment & Plan Note (Signed)
Chronic uncontrolled hypertension. Current regimen includes amlodipine 5 mg, which caused extreme anxiety, irritability, and mood changes. Previous medications include hydrochlorothiazide (ineffective) and enalapril (allergic reaction with lip swelling). Beta blockers were used during pregnancy but are not preferred for long-term management. Blood pressure today is 146/99. Home readings on amlodipine were 126/76. Current reading in office is 130/70. Discussed risks and benefits of spironolactone, clonidine, and beta blockers. Spironolactone preferred due to potassium-sparing properties and fewer side effects. Informed about potential side effects including hyperkalemia and need for monitoring. - Discontinue amlodipine - Start spironolactone 12.5 mg once daily - Monitor blood pressure at home for one week - If blood pressure remains =140/90, adjust spironolactone dosage - Check potassium levels after two weeks of spironolactone - Provide DASH diet information - Encourage lifestyle modifications including reduced sodium intake, increased water intake, and regular exercise

## 2023-09-03 NOTE — Patient Instructions (Signed)
VISIT SUMMARY:  Today, we discussed your ongoing issues with high blood pressure and the side effects you have been experiencing with your current medication, amlodipine. We also reviewed your recent blood test results, which indicated prediabetes. We have made some changes to your treatment plan to better manage your conditions and improve your overall health.  YOUR PLAN:  -HYPERTENSION: Hypertension, or high blood pressure, is a condition where the force of the blood against your artery walls is too high. We will discontinue your current medication, amlodipine, due to its side effects and start you on spironolactone 12.5 mg once daily. Please monitor your blood pressure at home for one week. If your blood pressure remains at or above 140/90, we will adjust the dosage. We will also check your potassium levels after two weeks. Additionally, we discussed the DASH diet and lifestyle changes such as reducing sodium intake, increasing water intake, and regular exercise to help manage your blood pressure.  -PREDIABETES: Prediabetes is a condition where your blood sugar levels are higher than normal but not high enough to be classified as diabetes. Your HbA1c level is 5.8, which indicates prediabetes. It is important to make lifestyle changes to prevent progression to diabetes. We recommend reducing your carbohydrate intake, engaging in regular exercise, and monitoring your HbA1c levels periodically.  INSTRUCTIONS:  Please follow up in two weeks for a blood pressure check and to test your potassium levels.

## 2023-09-19 ENCOUNTER — Other Ambulatory Visit: Payer: Self-pay | Admitting: Family Medicine

## 2023-09-19 DIAGNOSIS — I10 Essential (primary) hypertension: Secondary | ICD-10-CM

## 2023-09-19 NOTE — Telephone Encounter (Signed)
 Medication Refill -  Most Recent Primary Care Visit:  Provider: SIMMONS-ROBINSON, MAKIERA  Department: [facility]  Visit Type: OFFICE VISIT  Date: 09/03/2023  Medication:spironolactone  (ALDACTONE ) 25 MG tablet     pt stated that she has been taking a whole tablet that is why she out.  Has the patient contacted their pharmacy? Yes   Is this the correct pharmacy for this prescription? Yes  [facility]  [address]  [place], [place] [address] Phone [phone] Fax [phone]  Has the prescription been filled recently? Yes  Is the patient out of the medication? Yes  Has the patient been seen for an appointment in the last year OR does the patient have an upcoming appointment? No  Can we respond through MyChart? No  Agent: Please be advised that Rx refills may take up to 3 business days. We ask that you follow-up with your pharmacy.

## 2023-09-20 ENCOUNTER — Telehealth: Payer: Self-pay | Admitting: Family Medicine

## 2023-09-20 ENCOUNTER — Other Ambulatory Visit: Payer: Self-pay | Admitting: Family Medicine

## 2023-09-20 DIAGNOSIS — I10 Essential (primary) hypertension: Secondary | ICD-10-CM

## 2023-09-20 MED ORDER — SPIRONOLACTONE 25 MG PO TABS: 12.5000 mg | ORAL_TABLET | Freq: Every day | ORAL | 0 refills | Status: DC

## 2023-09-20 NOTE — Telephone Encounter (Signed)
 Pt is calling in because pt says pharmacy doesn't have the prescription but Epic says it has been sent spironolactone (ALDACTONE) 25 MG tablet [[id number]] .

## 2023-09-20 NOTE — Telephone Encounter (Signed)
 Medication Refill -  Most Recent Primary Care Visit:  Provider: SIMMONS-ROBINSON, MAKIERA  Department: [facility]  Visit Type: OFFICE VISIT  Date: 09/03/2023  Medication: spironolactone  (ALDACTONE ) 25 MG tablet   Pt stated that she has been taking a whole tablet; that is why she is out. Pt is upset requesting her medication be sent in as soon as possible.   Has the patient contacted their pharmacy? Yes  (Agent: If yes, when and what did the pharmacy advise?)  Is this the correct pharmacy for this prescription? Yes If no, delete pharmacy and type the correct one.  This is the patient's preferred pharmacy:   [phone] [place] - [address] [place] [address] Phone: [phone] Fax: [phone]   Has the prescription been filled recently? Yes  Is the patient out of the medication? Yes  Has the patient been seen for an appointment in the last year OR does the patient have an upcoming appointment? Yes  Can we respond through MyChart? Yes  Agent: Please be advised that Rx refills may take up to 3 business days. We ask that you follow-up with your pharmacy.

## 2023-09-20 NOTE — Telephone Encounter (Signed)
 Patient is calling for refill, advised Agent to let her know I will send in enough until her appointment on 09/24/23. Refill x 10 days.  Requested Prescriptions  Pending Prescriptions Disp Refills   spironolactone  (ALDACTONE ) 25 MG tablet 5 tablet 0    Sig: Take 0.5 tablets (12.5 mg total) by mouth daily. OFFICE VISIT NEEDED FOR ADDITIONAL REFILLS     Cardiovascular: Diuretics - Aldosterone Antagonist Passed - 09/20/2023  4:59 PM      Passed - Cr in normal range and within 180 days    Creat  Date Value Ref Range Status  08/08/2023 0.63 0.50 - 0.97 mg/dL Final   Creatinine, Urine  Date Value Ref Range Status  10/24/2019 31.20 mg/dL Final         Passed - K in normal range and within 180 days    Potassium  Date Value Ref Range Status  08/08/2023 4.2 3.5 - 5.3 mmol/L Final         Passed - Na in normal range and within 180 days    Sodium  Date Value Ref Range Status  08/08/2023 139 135 - 146 mmol/L Final  05/30/2023 136 134 - 144 mmol/L Final         Passed - eGFR is 30 or above and within 180 days    GFR calc Af Amer  Date Value Ref Range Status  09/19/2020 136 >59 mL/min/1.73 Final    Comment:    **In accordance with recommendations from the NKF-ASN Task force,**   [facility] is in the process of updating its eGFR calculation to the   2021 CKD-EPI creatinine equation that estimates kidney function   without a race variable.    GFR, Estimated  Date Value Ref Range Status  10/09/2021 >60 >60 mL/min Final    Comment:    (NOTE) Calculated using the CKD-EPI Creatinine Equation (2021)    eGFR  Date Value Ref Range Status  08/08/2023 121 > OR = 60 mL/min/1.48m2 Final  05/30/2023 108 >59 mL/min/1.73 Final         Passed - Last BP in normal range    BP Readings from Last 1 Encounters:  09/03/23 130/70         Passed - Valid encounter within last 6 months    Recent Outpatient Visits           2 weeks ago Benign essential hypertension   [facility] Simmons-Robinson, [place], [place]   1 month ago Palpitations   [facility] Mecum, Rocky BRAVO, PA-C   3 months ago Overweight   [facility] Emilio Marseille T, FNP   3 months ago Benign essential hypertension   [facility] Emilio Marseille T, FNP   7 months ago Benign essential hypertension   [facility] Bacigalupo, Jon HERO, MD       Future Appointments             In 4 days Simmons-Robinson, [place], MD [facility]

## 2023-09-21 NOTE — Telephone Encounter (Signed)
 Sent to pharmacy on 09/20/23 in a separate refill encounter. Routed an encounter from patient call to office for provider review.

## 2023-09-21 NOTE — Telephone Encounter (Signed)
 Medication refilled on 09/20/23, upcoming appointment on 09/24/23, patient reports taking 1 pill not 1/2 pill as prescribed, will route this to the office for provider review.

## 2023-09-23 MED ORDER — SPIRONOLACTONE 25 MG PO TABS: 25.0000 mg | ORAL_TABLET | Freq: Every day | ORAL | 0 refills | Status: DC

## 2023-09-23 NOTE — Telephone Encounter (Signed)
 Rx sent

## 2023-09-24 ENCOUNTER — Ambulatory Visit: Payer: 59 | Admitting: Family Medicine

## 2023-09-24 ENCOUNTER — Encounter: Payer: Self-pay | Admitting: Family Medicine

## 2023-09-24 VITALS — BP 140/96 | HR 68 | Ht 64.0 in | Wt 151.0 lb

## 2023-09-24 DIAGNOSIS — I10 Essential (primary) hypertension: Secondary | ICD-10-CM | POA: Diagnosis not present

## 2023-09-24 DIAGNOSIS — R002 Palpitations: Secondary | ICD-10-CM | POA: Diagnosis not present

## 2023-09-24 MED ORDER — METOPROLOL SUCCINATE ER 25 MG PO TB24: 25.0000 mg | ORAL_TABLET | Freq: Every day | ORAL | 3 refills | Status: DC

## 2023-09-24 NOTE — Progress Notes (Signed)
 Established patient visit   Patient: Cynthia Crawford   DOB: 07-21-1991   33 y.o. Female  MRN: [id number] Visit Date: 09/24/2023  Today's healthcare provider: Rockie Agent, MD   Chief Complaint  Patient presents with   Follow-up    HTN and labs   Subjective     HPI     Follow-up    Additional comments: HTN and labs        Comments   Home Reading:Highest--132/85, lowest 118/78.      Last edited by Deitra Therisa HERO, CMA on 09/24/2023  1:57 PM.       Discussed the use of AI scribe software for clinical note transcription with the patient, who gave verbal consent to proceed.  History of Present Illness   The patient is a 33 year old individual with a history of chronic hypertension, which remains uncontrolled. She reports elevated blood pressure readings at home, with values ranging from 118/78 to 130/85. The patient has a history of requiring beta blockers during pregnancy due to hypertension. Currently, she is on spironolactone  25mg  daily, having discontinued amlodipine  due to associated anxiety. Hydrochlorothiazide  was previously ineffective for her, and the current regimen of spironolactone  is not controlling her blood pressure.  The patient occasionally experiences dull headaches, which she associates with periods of higher blood pressure. She has no reported chest pain, blurry vision, or dizziness. She has been prescribed Xanax  for use as needed, following an episode of heightened anxiety, but is trying alternative measures before resorting to medication.  The patient has a history of palpitations, described as a racing heart, which she has felt since high school. She reports having worn a heart monitor in the past, but no abnormal rhythm was detected. However, she does report a family history of heart surgery.  The patient is open to lifestyle changes and is interested in understanding how these changes might impact her blood pressure. She is also  open to medication adjustments, including a potential return to beta blockers.       Past Medical History:  Diagnosis Date   Asthma    Contact dermatitis due to soap 11/20/2017   GERD (gastroesophageal reflux disease) 07/13/2021   History of gestational hypertension    History of kidney stones 01/16/2019   Hypertension 08/29/2017   Intrinsic eczema 08/05/2018   Migraine 09/25/2011    Medications: Outpatient Medications Prior to Visit  Medication Sig Note   cetirizine  (ZYRTEC ) 10 MG tablet Take 10 mg by mouth daily.    spironolactone  (ALDACTONE ) 25 MG tablet Take 1 tablet (25 mg total) by mouth daily.    ALPRAZolam  (XANAX ) 0.5 MG tablet Take 1 tablet (0.5 mg total) by mouth 2 (two) times daily as needed for anxiety. (Patient not taking: Reported on 09/24/2023)    [DISCONTINUED] amLODipine  (NORVASC ) 5 MG tablet Take 1 tablet (5 mg total) by mouth daily. (Patient not taking: Reported on 09/24/2023) 09/24/2023: reports that she experienced anxiety on this med   No facility-administered medications prior to visit.    Review of Systems  Last metabolic panel Lab Results  Component Value Date   GLUCOSE 74 08/08/2023   NA 139 08/08/2023   K 4.2 08/08/2023   CL 104 08/08/2023   CO2 27 08/08/2023   BUN 9 08/08/2023   CREATININE 0.63 08/08/2023   EGFR 121 08/08/2023   CALCIUM 9.6 08/08/2023   PROT 7.7 08/08/2023   ALBUMIN 4.7 05/30/2023   LABGLOB 2.6 05/30/2023   AGRATIO 1.5 07/20/2022  BILITOT 0.3 08/08/2023   ALKPHOS 69 05/30/2023   AST 10 08/08/2023   ALT 7 08/08/2023   ANIONGAP 9 10/09/2021        Objective    BP (!) 140/96 (Cuff Size: Normal)   Pulse 68   Ht 5' 4 (1.626 m)   Wt 151 lb (68.5 kg)   BMI 25.92 kg/m  BP Readings from Last 3 Encounters:  09/24/23 (!) 140/96  09/03/23 130/70  08/08/23 128/76   Wt Readings from Last 3 Encounters:  09/24/23 151 lb (68.5 kg)  09/03/23 149 lb (67.6 kg)  08/08/23 153 lb 1.6 oz (69.4 kg)        Physical Exam   General: Alert, no acute distress Cardio: systolic murmur 2/6, RRR, no r/g Pulm: CTAB, normal work of breathing   No results found for any visits on 09/24/23.  Assessment & Plan     Problem List Items Addressed This Visit       Cardiovascular and Mediastinum   Benign essential hypertension - Primary   Uncontrolled chronic hypertension. Blood pressure today is 139/100 mmHg. Currently on spironolactone  25 mg daily, which is ineffective. Previously on amlodipine  (caused anxiousness) and hydrochlorothiazide  (ineffective). Discussed metoprolol , similar to lebetalol used during pregnancy, with potential to increase up to 200 mg daily. - Prescribe metoprolol  25 mg once daily, increase to 50 mg if blood pressure remains high after two weeks - Check potassium levels due to spironolactone  use - Encourage dietary modifications, including beet juice - Schedule follow-up in 17 days to review blood pressure readings      Relevant Medications   metoprolol  succinate (TOPROL -XL) 25 MG 24 hr tablet   Other Relevant Orders   [id number]+EGFR   Ambulatory referral to Cardiology   Other Visit Diagnoses       Palpitations       Relevant Orders   Ambulatory referral to Cardiology       Heart Murmur Detected heart murmur. History of abnormal heart rhythms, but previous monitoring showed no abnormalities. No prior cardiology evaluation. Need for cardiology referral to rule out underlying conditions and assist with blood pressure management. - Refer to cardiologist for evaluation in setting of uncontrolled HTN, patient report of palpitations and murmur on exam    Return in about 16 days (around 10/10/2023) for HTN.         Rockie Agent, MD  [facility] [phone] (phone) [phone] (fax)  [facility]

## 2023-09-24 NOTE — Assessment & Plan Note (Signed)
 Uncontrolled chronic hypertension. Blood pressure today is 139/100 mmHg. Currently on spironolactone  25 mg daily, which is ineffective. Previously on amlodipine  (caused anxiousness) and hydrochlorothiazide  (ineffective). Discussed metoprolol , similar to lebetalol used during pregnancy, with potential to increase up to 200 mg daily. - Prescribe metoprolol  25 mg once daily, increase to 50 mg if blood pressure remains high after two weeks - Check potassium levels due to spironolactone  use - Encourage dietary modifications, including beet juice - Schedule follow-up in 17 days to review blood pressure readings

## 2023-09-25 LAB — CMP14+EGFR
ALT: 7 [IU]/L (ref 0–32)
AST: 15 [IU]/L (ref 0–40)
Albumin: 4.8 g/dL (ref 3.9–4.9)
Alkaline Phosphatase: 65 [IU]/L (ref 44–121)
BUN/Creatinine Ratio: 16 (ref 9–23)
BUN: 12 mg/dL (ref 6–20)
Bilirubin Total: 0.2 mg/dL (ref 0.0–1.2)
CO2: 23 mmol/L (ref 20–29)
Calcium: 9.3 mg/dL (ref 8.7–10.2)
Chloride: 105 mmol/L (ref 96–106)
Creatinine, Ser: 0.74 mg/dL (ref 0.57–1.00)
Globulin, Total: 2.6 g/dL (ref 1.5–4.5)
Glucose: 86 mg/dL (ref 70–99)
Potassium: 3.7 mmol/L (ref 3.5–5.2)
Sodium: 142 mmol/L (ref 134–144)
Total Protein: 7.4 g/dL (ref 6.0–8.5)
eGFR: 110 mL/min/{1.73_m2} (ref >59)

## 2023-09-30 ENCOUNTER — Emergency Department (HOSPITAL_COMMUNITY): Payer: 59

## 2023-09-30 ENCOUNTER — Encounter (HOSPITAL_COMMUNITY): Payer: Self-pay | Admitting: Emergency Medicine

## 2023-09-30 ENCOUNTER — Emergency Department (HOSPITAL_COMMUNITY)
Admission: EM | Admit: 2023-09-30 | Discharge: 2023-10-01 | Discharge status: Against Medical Advice | Payer: 59 | Attending: Emergency Medicine | Admitting: Emergency Medicine

## 2023-09-30 ENCOUNTER — Other Ambulatory Visit: Payer: Self-pay

## 2023-09-30 DIAGNOSIS — Z5321 Procedure and treatment not carried out due to patient leaving prior to being seen by health care provider: Secondary | ICD-10-CM | POA: Insufficient documentation

## 2023-09-30 DIAGNOSIS — R079 Chest pain, unspecified: Secondary | ICD-10-CM | POA: Diagnosis not present

## 2023-09-30 LAB — CBC
HCT: 38.5 % (ref 36.0–46.0)
Hemoglobin: 12.4 g/dL (ref 12.0–15.0)
MCH: 28.8 pg (ref 26.0–34.0)
MCHC: 32.2 g/dL (ref 30.0–36.0)
MCV: 89.3 fL (ref 80.0–100.0)
Platelets: 183 10*3/uL (ref 150–400)
RBC: 4.31 MIL/uL (ref 3.87–5.11)
RDW: 12.7 % (ref 11.5–15.5)
WBC: 7.4 10*3/uL (ref 4.0–10.5)
nRBC: 0 % (ref 0.0–0.2)

## 2023-09-30 LAB — BASIC METABOLIC PANEL
Anion gap: 7 (ref 5–15)
BUN: 14 mg/dL (ref 6–20)
CO2: 24 mmol/L (ref 22–32)
Calcium: 9.5 mg/dL (ref 8.9–10.3)
Chloride: 110 mmol/L (ref 98–111)
Creatinine, Ser: 0.78 mg/dL (ref 0.44–1.00)
GFR, Estimated: >60 mL/min (ref >60)
Glucose, Bld: 118 mg/dL — ABNORMAL HIGH (ref 70–99)
Potassium: 3.5 mmol/L (ref 3.5–5.1)
Sodium: 141 mmol/L (ref 135–145)

## 2023-09-30 LAB — HCG, SERUM, QUALITATIVE: Preg, Serum: NEGATIVE

## 2023-09-30 LAB — TROPONIN I (HIGH SENSITIVITY): Troponin I (High Sensitivity): <2 ng/L (ref <18)

## 2023-09-30 NOTE — ED Triage Notes (Signed)
 Patient c/o chest pain x2 days. Patient report pain radiating to her back. Patient denies SOB. Patient denies N/V.

## 2023-10-01 ENCOUNTER — Telehealth: Payer: Self-pay

## 2023-10-01 ENCOUNTER — Ambulatory Visit: Payer: Self-pay

## 2023-10-01 NOTE — Telephone Encounter (Signed)
 Copied from CRM [id number]. Topic: Appointments - Appointment Scheduling >> Oct 01, 2023  9:03 AM Phill Myron wrote: Pt Cynthia Crawford needs a hospital follow up and would like to speak with dr Roxan Hockey regarding lab, xray and ekg results.

## 2023-10-01 NOTE — Telephone Encounter (Signed)
 I called and spoke to patient. I relayed Dr. Rexanne Mano message and recommendations. Patient replied "yeah, I'm not going back there" when I recommended she return to ER if her chest pain had not resolved. She then said thank you and disconnected.

## 2023-10-01 NOTE — Telephone Encounter (Signed)
 Spoke with patient. Offered appointment with Curtis Boom 10/04/23 patient declined. Stated she is doesn't want to go to the ED and if something comes up she would just go to UC. Patient appointment from 01/23 with Dr. Lang was moved to 10/08/23. Patient asking if she needs to continue BP medication. Please see NT encounter.

## 2023-10-01 NOTE — ED Notes (Signed)
 Pt requesting to see Dr, the Dr was notified but pt wants to leave without being seen and to just have her results given to her. Stickers given to Clinical research associate.

## 2023-10-01 NOTE — Telephone Encounter (Signed)
 Summary: BP meds question   PLease call in regards to BP meds. Pt stated she recently went to the ER for chest pains, she would like to know if she should stop taking the BP meds       Chief Complaint: Pt. Went to ED yesterday and left before being seen, with chest pain. Asking to be worked in for appointment. Symptoms: Above Frequency: Yesterday Pertinent Negatives: Patient denies chest pain today Disposition: [] ED /[] Urgent Care (no appt availability in office) / [] Appointment(In office/virtual)/ []  [facility]/ [] Home Care/ [] Refused Recommended Disposition /[] [facility]/ [x]  Follow-up with PCP Additional Notes: Please advise pt.  Reason for Disposition  [1] Caller has URGENT medicine question about med that PCP or specialist prescribed AND [2] triager unable to answer question  Answer Assessment - Initial Assessment Questions 1. NAME of MEDICINE: What medicine(s) are you calling about?     Metoprolol   2. QUESTION: What is your question? (e.g., double dose of medicine, side effect)     Should I stop the medicine 3. PRESCRIBER: Who prescribed the medicine? Reason: if prescribed by specialist, call should be referred to that group.     Dr. Lang 4. SYMPTOMS: Do you have any symptoms? If Yes, ask: What symptoms are you having?  How bad are the symptoms (e.g., mild, moderate, severe)     Had chest pain 5. PREGNANCY:  Is there any chance that you are pregnant? When was your last menstrual period?     NO  Protocols used: Medication Question Call-A-AH

## 2023-10-01 NOTE — Telephone Encounter (Signed)
 Called patient to make follow up appointment and answer questions.  Patient was very upset because she spent 6 hours in the ER and wasn't seen.  Patient was told appointment could not be made with her provider for a few days and did she want something with someone else.  Patient did not respond so I told her the first thing her provider had was 10/10/23 and she said ok.  Then she asked about her labs and I told her that they weren't signed off but the only level abnormal that I saw was her glucose but that a provider needed to check them.  She then said to me doesn't anyone have any telehealth visits before the 23.  I told her that since she didn't answer me I had not yet checked but that I would.  Then she started asking about a referral and I asked her if we could take care of 1 thing at a time so I could get her a different appointment and she said she didn't like the way I was talking to her and she wanted to speak to someone else.  I advised I would have another CMA call her.

## 2023-10-01 NOTE — Telephone Encounter (Signed)
 Please review and advise. Patient was offered a sooner appointment with another provider and decline.

## 2023-10-08 ENCOUNTER — Telehealth: Payer: 59 | Admitting: Family Medicine

## 2023-10-08 ENCOUNTER — Ambulatory Visit: Payer: 59 | Admitting: Family Medicine

## 2023-10-08 ENCOUNTER — Encounter: Payer: Self-pay | Admitting: Family Medicine

## 2023-10-08 VITALS — BP 142/86 | HR 55 | Ht 64.0 in | Wt 153.0 lb

## 2023-10-08 DIAGNOSIS — I10 Essential (primary) hypertension: Secondary | ICD-10-CM

## 2023-10-08 DIAGNOSIS — F411 Generalized anxiety disorder: Secondary | ICD-10-CM

## 2023-10-08 DIAGNOSIS — F430 Acute stress reaction: Secondary | ICD-10-CM | POA: Insufficient documentation

## 2023-10-08 MED ORDER — BUSPIRONE HCL 5 MG PO TABS: 5.0000 mg | ORAL_TABLET | Freq: Two times a day (BID) | ORAL | 3 refills | Status: AC

## 2023-10-08 NOTE — Assessment & Plan Note (Signed)
Reports anxiety, especially after having children. Previous use of hydroxyzine and THC. No current use of Prozac. Discussed Buspar and potential side effects, including increased anxiety in 5-17% of patients. Prefers non-addictive options. Emphasized lifestyle modifications such as adequate sleep, healthy diet, and conflict resolution. Seeking therapy through [facility]. - Start Buspar 5 mg twice daily - Monitor for side effects and efficacy - Continue seeking therapy through [facility] - Encourage lifestyle modifications including adequate sleep, healthy diet, and conflict resolution

## 2023-10-08 NOTE — Progress Notes (Signed)
Established patient visit   Patient: Cynthia Crawford   DOB: [birth year]   32 y.o. Female  MRN: [id number] Visit Date: 10/08/2023  Today's healthcare provider: Ronnald Ramp, MD   No chief complaint on file.  Subjective     HPI   ED f/u---Lowest:127/77, Highest:155/94, sometimes headache. Last edited by Shelly Bombard, CMA on 10/08/2023  3:04 PM.       Discussed the use of AI scribe software for clinical note transcription with the patient, who gave verbal consent to proceed.  History of Present Illness   The patient, a 33 year old with a history of hypertension, recently presented to the emergency department with chest pain. The pain began the day before the hospital visit, subsided, and then returned the following day, radiating to the back. The patient underwent vitals, x-ray, and EKG, all of which were reported as normal. Since doubling the dose of metoprolol to 50mg  daily, she reports that the chest pain has subsided. She has not been taking the prescribed spironolactone 25mg .  The patient's blood pressure has been monitored since the hospital visit, with readings typically around 145/85, though occasionally as low as 127/77. She reports no further chest discomfort since the emergency department visit.  The patient also reports feelings of anxiety, describing a need to constantly think ahead and a sense of being overwhelmed. This anxiety reportedly began after the birth of her children. The patient has previously tried hydroxyzine and Prozac for these symptoms, with limited success. She is currently seeking a new therapist for further management of these symptoms.         Past Medical History:  Diagnosis Date   Asthma    Contact dermatitis due to soap 11/20/2017   GERD (gastroesophageal reflux disease) 07/13/2021   History of gestational hypertension    History of kidney stones 01/16/2019   Hypertension 08/29/2017   Intrinsic eczema 08/05/2018    Migraine 09/25/2011    Medications: Outpatient Medications Prior to Visit  Medication Sig   cetirizine (ZYRTEC) 10 MG tablet Take 10 mg by mouth daily.   metoprolol succinate (TOPROL-XL) 25 MG 24 hr tablet Take 1 tablet (25 mg total) by mouth daily. (Patient taking differently: Take 25 mg by mouth daily. Taking 2 tablet daily)   spironolactone (ALDACTONE) 25 MG tablet Take 1 tablet (25 mg total) by mouth daily. (Patient not taking: Reported on 10/08/2023)   [DISCONTINUED] ALPRAZolam (XANAX) 0.5 MG tablet Take 1 tablet (0.5 mg total) by mouth 2 (two) times daily as needed for anxiety. (Patient not taking: Reported on 09/03/2023)   No facility-administered medications prior to visit.    Review of Systems  Last CBC Lab Results  Component Value Date   WBC 7.4 09/30/2023   HGB 12.4 09/30/2023   HCT 38.5 09/30/2023   MCV 89.3 09/30/2023   MCH 28.8 09/30/2023   RDW 12.7 09/30/2023   PLT 183 09/30/2023   Last metabolic panel Lab Results  Component Value Date   GLUCOSE 118 (H) 09/30/2023   NA 141 09/30/2023   K 3.5 09/30/2023   CL 110 09/30/2023   CO2 24 09/30/2023   BUN 14 09/30/2023   CREATININE 0.78 09/30/2023   GFRNONAA >60 09/30/2023   CALCIUM 9.5 09/30/2023   PROT 7.4 09/24/2023   ALBUMIN 4.8 09/24/2023   LABGLOB 2.6 09/24/2023   AGRATIO 1.5 07/20/2022   BILITOT 0.2 09/24/2023   ALKPHOS 65 09/24/2023   AST 15 09/24/2023   ALT 7 09/24/2023   ANIONGAP 7  09/30/2023   Last thyroid functions Lab Results  Component Value Date   TSH 1.15 08/08/2023        Objective    BP (!) 142/86 (BP Location: Left Arm, Patient Position: Sitting, Cuff Size: Normal) Comment: manual  Pulse (!) 55   Ht 5\' 4"  (1.626 m)   Wt 153 lb (69.4 kg)   BMI 26.26 kg/m   BP Readings from Last 3 Encounters:  10/08/23 (!) 142/86  09/30/23 (!) 167/112  09/24/23 (!) 140/96   Wt Readings from Last 3 Encounters:  10/08/23 153 lb (69.4 kg)  09/24/23 151 lb (68.5 kg)  09/03/23 149 lb (67.6 kg)         Physical Exam Vitals reviewed.  Constitutional:      General: She is not in acute distress.    Appearance: Normal appearance. She is not ill-appearing, toxic-appearing or diaphoretic.  Eyes:     Conjunctiva/sclera: Conjunctivae normal.  Cardiovascular:     Rate and Rhythm: Normal rate and regular rhythm.     Pulses: Normal pulses.     Heart sounds: Normal heart sounds. No murmur heard.    No friction rub. No gallop.  Pulmonary:     Effort: Pulmonary effort is normal. No respiratory distress.     Breath sounds: Normal breath sounds. No stridor. No wheezing, rhonchi or rales.  Abdominal:     General: Bowel sounds are normal. There is no distension.     Palpations: Abdomen is soft.     Tenderness: There is no abdominal tenderness.  Musculoskeletal:     Right lower leg: No edema.     Left lower leg: No edema.  Skin:    Findings: No erythema or rash.  Neurological:     Mental Status: She is alert and oriented to person, place, and time.  Psychiatric:        Attention and Perception: Attention normal.        Mood and Affect: Mood is anxious. Affect is tearful.        Speech: Speech normal.        Behavior: Behavior normal. Behavior is cooperative.        No results found for any visits on 10/08/23.  Assessment & Plan     Problem List Items Addressed This Visit       Cardiovascular and Mediastinum   Benign essential hypertension - Primary   Chronic Hypertension with previous readings as high as 160/100 mmHg. Current readings around 140/85 mmHg. Discussed potential use of clonidine if blood pressure remains uncontrolled. Explained that metoprolol can lower heart rate, currently in the 50s. Emphasized home monitoring of blood pressure and heart rate. - Continue metoprolol 50 mg daily - Start spironolactone 25 mg daily - Monitor blood pressure and heart rate at home - Adjust metoprolol dose based on readings - Consider clonidine if blood pressure remains  uncontrolled        Other   Anxiety as acute reaction to gross stress   Reports anxiety, especially after having children. Previous use of hydroxyzine and THC. No current use of Prozac. Discussed Buspar and potential side effects, including increased anxiety in 5-17% of patients. Prefers non-addictive options. Emphasized lifestyle modifications such as adequate sleep, healthy diet, and conflict resolution. Seeking therapy through [facility]. - Start Buspar 5 mg twice daily - Monitor for side effects and efficacy - Continue seeking therapy through [facility] - Encourage lifestyle modifications including adequate sleep, healthy diet, and conflict resolution  Relevant Medications   busPIRone (BUSPAR) 5 MG tablet       Chest Pain Follow-up for chest pain initially evaluated in the hospital. No abnormal findings from labs, vitals, x-ray, or EKG. No recurrent chest pain since doubling metoprolol dose to 50 mg daily. Blood pressure around 140/85 mmHg. Heart rate in the 50s. Oxygen saturation 99%. - Continue metoprolol 50 mg daily - Start spironolactone 25 mg daily - Monitor blood pressure and heart rate at home - Follow-up with cardiology on March 25    General Health Maintenance Discussed the importance of a healthy lifestyle including diet, exercise, and mental health. Encouraged the use of beetroot for potential blood pressure benefits. - Encourage a diet rich in fruits and vegetables - Encourage regular physical activity - Monitor for any side effects from beetroot supplementation    Return in about 2 weeks (around 10/22/2023) for HTN.       Ronnald Ramp, MD  [facility] [phone] (phone) [phone] (fax)  [facility]

## 2023-10-08 NOTE — Patient Instructions (Signed)
VISIT SUMMARY:  During today's visit, we reviewed your recent emergency department visit for chest pain, your ongoing hypertension management, and your anxiety symptoms. We discussed your current medications, potential new treatments, and lifestyle modifications to help manage your conditions.  YOUR PLAN:  -CHEST PAIN: Chest pain can be a symptom of various heart conditions, but your recent tests were normal. Since increasing your metoprolol dose to 50 mg daily, your chest pain has subsided. Continue taking metoprolol 50 mg daily, start spironolactone 25 mg daily, and monitor your blood pressure and heart rate at home. Follow up with cardiology on March 25.  -HYPERTENSION: Hypertension, or high blood pressure, can increase the risk of heart disease. Your blood pressure readings have improved but are still elevated. Continue taking metoprolol 50 mg daily, start spironolactone 25 mg daily, and monitor your blood pressure and heart rate at home. We may adjust your metoprolol dose based on your readings and consider adding clonidine if needed.  -ANXIETY: Anxiety is a feeling of worry or fear that can affect daily life. You reported increased anxiety, especially after having children. We discussed starting Buspar 5 mg twice daily, monitoring for side effects, and continuing to seek therapy. Lifestyle modifications such as adequate sleep, a healthy diet, and conflict resolution were also encouraged.  -GENERAL HEALTH MAINTENANCE: Maintaining a healthy lifestyle is important for overall well-being. We discussed the benefits of a diet rich in fruits and vegetables, regular physical activity, and monitoring for any side effects from beetroot supplementation.  INSTRUCTIONS:  Follow up with cardiology on March 25. Monitor your blood pressure and heart rate at home. Reassess in two weeks to evaluate blood pressure and heart rate control.

## 2023-10-08 NOTE — Assessment & Plan Note (Signed)
Chronic Hypertension with previous readings as high as 160/100 mmHg. Current readings around 140/85 mmHg. Discussed potential use of clonidine if blood pressure remains uncontrolled. Explained that metoprolol can lower heart rate, currently in the 50s. Emphasized home monitoring of blood pressure and heart rate. - Continue metoprolol 50 mg daily - Start spironolactone 25 mg daily - Monitor blood pressure and heart rate at home - Adjust metoprolol dose based on readings - Consider clonidine if blood pressure remains uncontrolled

## 2023-10-09 ENCOUNTER — Other Ambulatory Visit: Payer: Self-pay | Admitting: Family Medicine

## 2023-10-09 DIAGNOSIS — I10 Essential (primary) hypertension: Secondary | ICD-10-CM

## 2023-10-09 MED ORDER — SPIRONOLACTONE 25 MG PO TABS: 25.0000 mg | ORAL_TABLET | Freq: Every day | ORAL | 0 refills | Status: DC

## 2023-10-09 NOTE — Telephone Encounter (Signed)
Requested Prescriptions  Pending Prescriptions Disp Refills   spironolactone (ALDACTONE) 25 MG tablet 90 tablet 0    Sig: Take 1 tablet (25 mg total) by mouth daily.     Cardiovascular: Diuretics - Aldosterone Antagonist Failed - 10/09/2023  5:28 PM      Failed - Last BP in normal range    BP Readings from Last 1 Encounters:  10/08/23 (!) 142/86         Passed - Cr in normal range and within 180 days    Creat  Date Value Ref Range Status  08/08/2023 0.63 0.50 - 0.97 mg/dL Final   Creatinine, Ser  Date Value Ref Range Status  09/30/2023 0.78 0.44 - 1.00 mg/dL Final   Creatinine, Urine  Date Value Ref Range Status  10/24/2019 31.20 mg/dL Final         Passed - K in normal range and within 180 days    Potassium  Date Value Ref Range Status  09/30/2023 3.5 3.5 - 5.1 mmol/L Final         Passed - Na in normal range and within 180 days    Sodium  Date Value Ref Range Status  09/30/2023 141 135 - 145 mmol/L Final  09/24/2023 142 134 - 144 mmol/L Final         Passed - eGFR is 30 or above and within 180 days    GFR calc Af Amer  Date Value Ref Range Status  09/19/2020 136 >59 mL/min/1.73 Final    Comment:    **In accordance with recommendations from the NKF-ASN Task force,**   [facility] is in the process of updating its eGFR calculation to the   2021 CKD-EPI creatinine equation that estimates kidney function   without a race variable.    GFR, Estimated  Date Value Ref Range Status  09/30/2023 >60 >60 mL/min Final    Comment:    (NOTE) Calculated using the CKD-EPI Creatinine Equation (2021)    eGFR  Date Value Ref Range Status  09/24/2023 110 >59 mL/min/1.73 Final         Passed - Valid encounter within last 6 months    Recent Outpatient Visits           Yesterday Benign essential hypertension   [place] [facility] Simmons-Robinson, [place], MD   2 weeks ago Benign essential hypertension   [place] [facility]  [place], [place], MD   1 month ago Benign essential hypertension   [place] [facility] [place], [place], MD   2 months ago Palpitations   [facility] Mecum, Oswaldo Conroy, PA-C   3 months ago Overweight   [facility] Merita Norton T, [place]       Future Appointments             In 2 weeks Simmons-Robinson, Tawanna Cooler, MD [facility]   In 2 months Tobb, Lavona [place], DO [facility]

## 2023-10-09 NOTE — Telephone Encounter (Signed)
Copied from CRM [id number]. Topic: General - Other >> Oct 09, 2023  5:00 PM Everette C wrote: Reason for CRM: Medication Refill - Most Recent Primary Care Visit:  Provider: Ronnald Ramp Department: [facility] Visit Type: OFFICE VISIT Date: 10/08/2023  Medication: spironolactone (ALDACTONE) 25 MG tablet [[id number]]  Has the patient contacted their pharmacy? Yes (Agent: If no, request that the patient contact the pharmacy for the refill. If patient does not wish to contact the pharmacy document the reason why and proceed with request.) (Agent: If yes, when and what did the pharmacy advise?)  Is this the correct pharmacy for this prescription? Yes If no, delete pharmacy and type the correct one.  This is the patient's preferred pharmacy:  [facility] [id number], [place] - [address] [place] [address] Phone: [phone] Fax: [phone]   Has the prescription been filled recently? Yes  Is the patient out of the medication? No  Has the patient been seen for an appointment in the last year OR does the patient have an upcoming appointment? Yes  Can we respond through MyChart? No  Agent: Please be advised that Rx refills may take up to 3 business days. We ask that you follow-up with your pharmacy.

## 2023-10-10 ENCOUNTER — Ambulatory Visit: Payer: 59 | Admitting: Family Medicine

## 2023-10-24 ENCOUNTER — Ambulatory Visit: Payer: 59 | Admitting: Family Medicine

## 2023-10-24 ENCOUNTER — Encounter: Payer: Self-pay | Admitting: Family Medicine

## 2023-10-24 VITALS — BP 126/77 | HR 62 | Ht 64.0 in | Wt 149.0 lb

## 2023-10-24 DIAGNOSIS — F430 Acute stress reaction: Secondary | ICD-10-CM

## 2023-10-24 DIAGNOSIS — I10 Essential (primary) hypertension: Secondary | ICD-10-CM | POA: Diagnosis not present

## 2023-10-24 DIAGNOSIS — F411 Generalized anxiety disorder: Secondary | ICD-10-CM | POA: Diagnosis not present

## 2023-10-24 NOTE — Progress Notes (Signed)
 Established patient visit   Patient: Cynthia Crawford   DOB: [birth year]   33 y.o. Female  MRN: [id number] Visit Date: 10/24/2023  Today's healthcare provider: Rockie Agent, MD   Chief Complaint  Patient presents with   Hypertension   Subjective       Discussed the use of AI scribe software for clinical note transcription with the patient, who gave verbal consent to proceed.  History of Present Illness   Cynthia Crawford is a 33 year old female with hypertension and anxiety who presents for a follow-up visit.  She is managing her hypertension with spironolactone  25 mg daily and metoprolol  25 mg daily. Her blood pressure is well-controlled, currently measuring 126/77 mmHg, and she is satisfied with this result.  For anxiety, she was started on buspirone  5 mg twice a day during her last visit. The medication has been effective, particularly in the mornings and when around people. Initially, she experienced a sensation in her stomach described as 'whoa', but denies any constipation, stating 'it was moving'.  Her household was affected by the flu last month, with her children and husband being sick, but she managed to stay well. She describes the past respiratory season as rough, impacting her ability to enjoy the holidays.         Past Medical History:  Diagnosis Date   Asthma    Contact dermatitis due to soap 11/20/2017   GERD (gastroesophageal reflux disease) 07/13/2021   History of gestational hypertension    History of kidney stones 01/16/2019   Hypertension 08/29/2017   Intrinsic eczema 08/05/2018   Migraine 09/25/2011    Medications: Outpatient Medications Prior to Visit  Medication Sig   busPIRone  (BUSPAR ) 5 MG tablet Take 1 tablet (5 mg total) by mouth 2 (two) times daily.   cetirizine  (ZYRTEC ) 10 MG tablet Take 10 mg by mouth daily.   metoprolol  succinate (TOPROL -XL) 25 MG 24 hr tablet Take 1 tablet (25 mg total) by mouth daily.    spironolactone  (ALDACTONE ) 25 MG tablet Take 1 tablet (25 mg total) by mouth daily.   No facility-administered medications prior to visit.    Review of Systems  Last metabolic panel Lab Results  Component Value Date   GLUCOSE 118 (H) 09/30/2023   NA 141 09/30/2023   K 3.5 09/30/2023   CL 110 09/30/2023   CO2 24 09/30/2023   BUN 14 09/30/2023   CREATININE 0.78 09/30/2023   GFRNONAA >60 09/30/2023   CALCIUM 9.5 09/30/2023   PROT 7.4 09/24/2023   ALBUMIN 4.8 09/24/2023   LABGLOB 2.6 09/24/2023   AGRATIO 1.5 07/20/2022   BILITOT 0.2 09/24/2023   ALKPHOS 65 09/24/2023   AST 15 09/24/2023   ALT 7 09/24/2023   ANIONGAP 7 09/30/2023        Objective    BP 126/77   Pulse 62   Ht 5' 4 (1.626 m)   Wt 149 lb (67.6 kg)   BMI 25.58 kg/m  BP Readings from Last 3 Encounters:  10/24/23 126/77  10/08/23 (!) 142/86  09/30/23 (!) 167/112   Wt Readings from Last 3 Encounters:  10/24/23 149 lb (67.6 kg)  10/08/23 153 lb (69.4 kg)  09/24/23 151 lb (68.5 kg)        Physical Exam Vitals reviewed.  Constitutional:      General: She is not in acute distress.    Appearance: Normal appearance. She is not ill-appearing, toxic-appearing or diaphoretic.  Eyes:     Conjunctiva/sclera:  Conjunctivae normal.  Cardiovascular:     Rate and Rhythm: Normal rate and regular rhythm.     Pulses: Normal pulses.     Heart sounds: Normal heart sounds. No murmur heard.    No friction rub. No gallop.  Pulmonary:     Effort: Pulmonary effort is normal. No respiratory distress.     Breath sounds: Normal breath sounds. No stridor. No wheezing, rhonchi or rales.  Abdominal:     General: Bowel sounds are normal. There is no distension.     Palpations: Abdomen is soft.     Tenderness: There is no abdominal tenderness.  Musculoskeletal:     Right lower leg: No edema.     Left lower leg: No edema.  Skin:    Findings: No erythema or rash.  Neurological:     Mental Status: She is alert and  oriented to person, place, and time.  Psychiatric:        Mood and Affect: Mood and affect normal.        Speech: Speech normal.        Behavior: Behavior normal. Behavior is cooperative.       No results found for any visits on 10/24/23.  Assessment & Plan     Problem List Items Addressed This Visit       Cardiovascular and Mediastinum   Benign essential hypertension - Primary   Hypertension is well-controlled with current medications. Blood pressure today is 126/77 mmHg. She is on spironolactone  25 mg daily and metoprolol  25 mg daily with no significant side effects reported. Chronic - Continue spironolactone  25 mg daily - Continue metoprolol  25 mg daily - Follow-up with Dr. Sheena in March for cardiology evaluation         Other   Anxiety as acute reaction to gross stress   Anxiety is managed with buspirone  5 mg twice daily. She reports feeling good, primarily using the medication in the morning. Initial gastrointestinal discomfort has resolved. Chronic, improved  - Continue buspirone  5 mg twice daily        General Health Maintenance Discussed the importance of annual physical exams, including screenings and vaccinations. Emphasized early detection and prevention through routine screenings and vaccinations. - Schedule annual physical exam for June or July 2025 - Perform necessary screenings and vaccinations during the physical exam     Return in about 4 months (around 02/21/2024) for CPE.         Rockie Agent, MD  [facility] [phone] (phone) [phone] (fax)  [facility]

## 2023-10-24 NOTE — Assessment & Plan Note (Signed)
 Hypertension is well-controlled with current medications. Blood pressure today is 126/77 mmHg. She is on spironolactone  25 mg daily and metoprolol  25 mg daily with no significant side effects reported. Chronic - Continue spironolactone  25 mg daily - Continue metoprolol  25 mg daily - Follow-up with Dr. Sheena in March for cardiology evaluation

## 2023-10-24 NOTE — Assessment & Plan Note (Signed)
 Anxiety is managed with buspirone  5 mg twice daily. She reports feeling good, primarily using the medication in the morning. Initial gastrointestinal discomfort has resolved. Chronic, improved  - Continue buspirone  5 mg twice daily

## 2023-10-30 ENCOUNTER — Other Ambulatory Visit: Payer: Self-pay | Admitting: Family Medicine

## 2023-10-30 DIAGNOSIS — F411 Generalized anxiety disorder: Secondary | ICD-10-CM

## 2023-10-31 NOTE — Telephone Encounter (Signed)
Request too soon for refill. Last refill 10/08/23 for 30 and 2 refills.  Requested Prescriptions  Pending Prescriptions Disp Refills   busPIRone (BUSPAR) 5 MG tablet [Pharmacy Med Name: BUSPIRONE HCL 5 MG TABLET] 180 tablet 2    Sig: TAKE 1 TABLET BY MOUTH TWICE A DAY     Psychiatry: Anxiolytics/Hypnotics - Non-controlled Passed - 10/31/2023  8:55 AM      Passed - Valid encounter within last 12 months    Recent Outpatient Visits           3 weeks ago Benign essential hypertension   [facility] Simmons-Robinson, [place], MD   1 month ago Benign essential hypertension   [place] Osgood, [place], MD   1 month ago Benign essential hypertension   [place] [place], [place], MD   2 months ago Palpitations   [facility] Mecum, Oswaldo Conroy, PA-C   4 months ago Overweight   [facility] Merita Norton T, FNP       Future Appointments             In 1 month Tobb, Lavona [place], DO [facility]   In 3 months Simmons-Robinson, [place], MD [facility] [place]

## 2023-11-11 DIAGNOSIS — F330 Major depressive disorder, recurrent, mild: Secondary | ICD-10-CM | POA: Diagnosis not present

## 2023-12-10 ENCOUNTER — Encounter: Payer: Self-pay | Admitting: Cardiology

## 2023-12-10 ENCOUNTER — Ambulatory Visit: Payer: 59 | Attending: Cardiology | Admitting: Cardiology

## 2023-12-10 VITALS — BP 158/98 | HR 60 | Ht 64.0 in | Wt 152.2 lb

## 2023-12-10 DIAGNOSIS — I119 Hypertensive heart disease without heart failure: Secondary | ICD-10-CM | POA: Diagnosis not present

## 2023-12-10 DIAGNOSIS — R011 Cardiac murmur, unspecified: Secondary | ICD-10-CM | POA: Diagnosis not present

## 2023-12-10 DIAGNOSIS — R7303 Prediabetes: Secondary | ICD-10-CM | POA: Diagnosis not present

## 2023-12-10 MED ORDER — AMLODIPINE BESYLATE 5 MG PO TABS: 5.0000 mg | ORAL_TABLET | Freq: Every day | ORAL | 3 refills | Status: DC

## 2023-12-10 NOTE — Patient Instructions (Signed)
 Medication Instructions:  Your physician has recommended you make the following change in your medication:  START: Amlodipine 5 mg once daily *If you need a refill on your cardiac medications before your next appointment, please call your pharmacy*   Testing/Procedures: Your physician has requested that you have an echocardiogram. Echocardiography is a painless test that uses sound waves to create images of your heart. It provides your doctor with information about the size and shape of your heart and how well your heart's chambers and valves are working. This procedure takes approximately one hour. There are no restrictions for this procedure. Please do NOT wear cologne, perfume, aftershave, or lotions (deodorant is allowed). Please arrive 15 minutes prior to your appointment time.  Please note: We ask at that you not bring children with you during ultrasound (echo/ vascular) testing. Due to room size and safety concerns, children are not allowed in the ultrasound rooms during exams. Our front office staff cannot provide observation of children in our lobby area while testing is being conducted. An adult accompanying a patient to their appointment will only be allowed in the ultrasound room at the discretion of the ultrasound technician under special circumstances. We apologize for any inconvenience.  Your physician has requested that you have a renal artery duplex. During this test, an ultrasound is used to evaluate blood flow to the kidneys. Allow one hour for this exam. Do not eat after midnight the day before and avoid carbonated beverages. Take your medications as you usually do.   Follow-Up: At [facility], you and your health needs are our priority.  As part of our continuing mission to provide you with exceptional heart care, we have created designated Provider Care Teams.  These Care Teams include your primary Cardiologist (physician) and Advanced Practice Providers (APPs -   Physician Assistants and Nurse Practitioners) who all work together to provide you with the care you need, when you need it.   Your next appointment:   12-16 week(s)  Provider:   Thomasene Ripple, DO   Other Instructions Please take your blood pressure daily for 2 weeks and send in a MyChart message. Please include heart rates. (One message at the end of the 2 weeks).   HOW TO TAKE YOUR BLOOD PRESSURE: Rest 5 minutes before taking your blood pressure. Don't smoke or drink caffeinated beverages for at least 30 minutes before. Take your blood pressure before (not after) you eat. Sit comfortably with your back supported and both feet on the floor (don't cross your legs). Elevate your arm to heart level on a table or a desk. Use the proper sized cuff. It should fit smoothly and snugly around your bare upper arm. There should be enough room to slip a fingertip under the cuff. The bottom edge of the cuff should be 1 inch above the crease of the elbow. Ideally, take 3 measurements at one sitting and record the average.

## 2023-12-11 ENCOUNTER — Encounter: Payer: Self-pay | Admitting: Cardiology

## 2023-12-11 NOTE — Progress Notes (Signed)
 Cardiology Office Note:    Date:  12/11/2023   ID:  Torre Pikus, DOB 05/10/1991, MRN [id number]  PCP:  Ronnald Ramp, MD  Cardiologist:  None  Electrophysiologist:  None   Referring MD: Brett Albino*      History of Present Illness:    Donnis Aleyah Balik is a 33 y.o. female a mother of four, was diagnosed with hypertension eight years ago following the birth of her first child. The hypertension persisted postpartum and has continued since. All four pregnancies were complicated by hypertension, but there were no instances of preeclampsia or gestational diabetes. The patient denies any plans for future pregnancies.  The patient has a history of allergies to hydrochlorothiazide and enalapril, which caused lip swelling. She is currently on metoprolol and aldactone, which were started simultaneously after previous medications proved ineffective. The patient has also previously taken amlodipine, but does not recall any adverse effects.  The patient has a home blood pressure cuff but does not take readings daily. She also reports a history of prediabetes, which was managed with dietary changes. She has never had an ultrasound of her heart or kidneys.  Past Medical History:  Diagnosis Date   Asthma    Contact dermatitis due to soap 11/20/2017   GERD (gastroesophageal reflux disease) 07/13/2021   History of gestational hypertension    History of kidney stones 01/16/2019   Hypertension 08/29/2017   Intrinsic eczema 08/05/2018   Migraine 09/25/2011    Past Surgical History:  Procedure Laterality Date   TONSILLECTOMY      Current Medications: Current Meds  Medication Sig   amLODipine (NORVASC) 5 MG tablet Take 1 tablet (5 mg total) by mouth daily.   busPIRone (BUSPAR) 5 MG tablet Take 1 tablet (5 mg total) by mouth 2 (two) times daily.   cetirizine (ZYRTEC) 10 MG tablet Take 10 mg by mouth daily.   metoprolol succinate (TOPROL-XL) 25 MG 24 hr  tablet Take 1 tablet (25 mg total) by mouth daily.   spironolactone (ALDACTONE) 25 MG tablet Take 1 tablet (25 mg total) by mouth daily.     Allergies:   Enalapril, Hydrochlorothiazide, Penicillins, and Sulfa antibiotics   Social History   Socioeconomic History   Marital status: Married    Spouse name: Shawn   Number of children: 3   Years of education: Not on file   Highest education level: Not on file  Occupational History   Occupation: Chiropodist    Comment: [employer]  Tobacco Use   Smoking status: Never   Smokeless tobacco: Never  Vaping Use   Vaping status: Never Used  Substance and Sexual Activity   Alcohol use: No   Drug use: No   Sexual activity: Yes    Partners: Male    Birth control/protection: None    Comment: one month ago  Other Topics Concern   Not on file  Social History Narrative   Not on file   Social Drivers of Health   Financial Resource Strain: Low Risk  (08/28/2017)   Overall Financial Resource Strain (CARDIA)    Difficulty of Paying Living Expenses: Not hard at all  Food Insecurity: No Food Insecurity (07/14/2022)   Hunger Vital Sign    Worried About Running Out of Food in the Last Year: Never true    Ran Out of Food in the Last Year: Never true  Transportation Needs: No Transportation Needs (07/14/2022)   PRAPARE - Administrator, Civil Service (Medical): No  Lack of Transportation (Non-Medical): No  Physical Activity: Inactive (08/28/2017)   Exercise Vital Sign    Days of Exercise per Week: 0 days    Minutes of Exercise per Session: 0 min  Stress: Stress Concern Present (08/28/2017)   [employer] of Occupational Health - Occupational Stress Questionnaire    Feeling of Stress : To some extent  Social Connections: Socially Integrated (08/28/2017)   Social Connection and Isolation Panel [NHANES]    Frequency of Communication with Friends and Family: More than three times a week    Frequency of  Social Gatherings with Friends and Family: More than three times a week    Attends Religious Services: More than 4 times per year    Active Member of [employer] or Organizations: Yes    Attends Engineer, structural: More than 4 times per year    Marital Status: Married     Family History: The patient's family history includes Asthma in her mother; Diabetes in her father; Hypertension in her maternal grandmother. There is no history of Heart disease or Stroke.  ROS:   Review of Systems  Constitution: Negative for decreased appetite, fever and weight gain.  HENT: Negative for congestion, ear discharge, hoarse voice and sore throat.   Eyes: Negative for discharge, redness, vision loss in right eye and visual halos.  Cardiovascular: Negative for chest pain, dyspnea on exertion, leg swelling, orthopnea and palpitations.  Respiratory: Negative for cough, hemoptysis, shortness of breath and snoring.   Endocrine: Negative for heat intolerance and polyphagia.  Hematologic/Lymphatic: Negative for bleeding problem. Does not bruise/bleed easily.  Skin: Negative for flushing, nail changes, rash and suspicious lesions.  Musculoskeletal: Negative for arthritis, joint pain, muscle cramps, myalgias, neck pain and stiffness.  Gastrointestinal: Negative for abdominal pain, bowel incontinence, diarrhea and excessive appetite.  Genitourinary: Negative for decreased libido, genital sores and incomplete emptying.  Neurological: Negative for brief paralysis, focal weakness, headaches and loss of balance.  Psychiatric/Behavioral: Negative for altered mental status, depression and suicidal ideas.  Allergic/Immunologic: Negative for HIV exposure and persistent infections.    EKGs/Labs/Other Studies Reviewed:    The following studies were reviewed today:   EKG: None today   Recent Labs: 08/08/2023: TSH 1.15 09/24/2023: ALT 7 09/30/2023: BUN 14; Creatinine, Ser 0.78; Hemoglobin 12.4; Platelets 183;  Potassium 3.5; Sodium 141  Recent Lipid Panel    Component Value Date/Time   CHOL 213 (H) 07/20/2022 1308   TRIG 119 07/20/2022 1308   HDL 81 07/20/2022 1308   CHOLHDL 2.6 07/20/2022 1308   LDLCALC 111 (H) 07/20/2022 1308    Physical Exam:    VS:  BP (!) 158/98 (BP Location: Right Arm)   Pulse 60   Ht 5\' 4"  (1.626 m)   Wt 152 lb 3.2 oz (69 kg)   SpO2 95%   BMI 26.13 kg/m     Wt Readings from Last 3 Encounters:  12/10/23 152 lb 3.2 oz (69 kg)  10/24/23 149 lb (67.6 kg)  10/08/23 153 lb (69.4 kg)     GEN: Well nourished, well developed in no acute distress HEENT: Normal NECK: No JVD; No carotid bruits LYMPHATICS: No lymphadenopathy CARDIAC: S1S2 noted,RRR, 3/6 holosystolic murmurs, rubs, gallops RESPIRATORY:  Clear to auscultation without rales, wheezing or rhonchi  ABDOMEN: Soft, non-tender, non-distended, +bowel sounds, no guarding. EXTREMITIES: No edema, No cyanosis, no clubbing MUSCULOSKELETAL:  No deformity  SKIN: Warm and dry NEUROLOGIC:  Alert and oriented x 3, non-focal PSYCHIATRIC:  Normal affect, good insight  ASSESSMENT:  1. Hypertensive heart disease without heart failure   2. Cardiac murmur   3. Prediabetes    PLAN:     Hypertension Chronic hypertension managed with metoprolol and spironolactone. Adverse reactions to enalapril and hydrochlorothiazide. Current management requires optimization due to elevated blood pressure and leg swelling. Amlodipine reintroduced cautiously. Transition from metoprolol to carvedilol considered. Chlorthalidone considered as alternative diuretic. - Add amlodipine 5 mg to current regimen. - Monitor blood pressure at home twice daily and log results in MyChart. - Order echocardiogram to assess cardiac function and evaluate heart murmur. - Order renal ultrasound to rule out secondary causes of hypertension. - Consider transitioning from metoprolol to carvedilol based on home blood pressure readings. - Evaluate potential  use of chlorthalidone as an alternative diuretic.  Heart murmur Detected during examination. - Order echocardiogram to evaluate for any structural abnormalities.  Prediabetes Hemoglobin A1c at 5.8% indicates prediabetes. Advised on dietary changes. Continued monitoring necessary to prevent progression to diabetes. - Continue monitoring hemoglobin A1c and encourage dietary modifications.    The patient is in agreement with the above plan. The patient left the office in stable condition.  The patient will follow up in   Medication Adjustments/Labs and Tests Ordered: Current medicines are reviewed at length with the patient today.  Concerns regarding medicines are outlined above.  Orders Placed This Encounter  Procedures   ECHOCARDIOGRAM COMPLETE   VAS US RENAL ARTERY DUPLEX   Meds ordered this encounter  Medications   amLODipine (NORVASC) 5 MG tablet    Sig: Take 1 tablet (5 mg total) by mouth daily.    Dispense:  90 tablet    Refill:  3    Patient Instructions  Medication Instructions:  Your physician has recommended you make the following change in your medication:  START: Amlodipine 5 mg once daily *If you need a refill on your cardiac medications before your next appointment, please call your pharmacy*   Testing/Procedures: Your physician has requested that you have an echocardiogram. Echocardiography is a painless test that uses sound waves to create images of your heart. It provides your doctor with information about the size and shape of your heart and how well your heart's chambers and valves are working. This procedure takes approximately one hour. There are no restrictions for this procedure. Please do NOT wear cologne, perfume, aftershave, or lotions (deodorant is allowed). Please arrive 15 minutes prior to your appointment time.  Please note: We ask at that you not bring children with you during ultrasound (echo/ vascular) testing. Due to room size and safety  concerns, children are not allowed in the ultrasound rooms during exams. Our front office staff cannot provide observation of children in our lobby area while testing is being conducted. An adult accompanying a patient to their appointment will only be allowed in the ultrasound room at the discretion of the ultrasound technician under special circumstances. We apologize for any inconvenience.  Your physician has requested that you have a renal artery duplex. During this test, an ultrasound is used to evaluate blood flow to the kidneys. Allow one hour for this exam. Do not eat after midnight the day before and avoid carbonated beverages. Take your medications as you usually do.   Follow-Up: At [facility], you and your health needs are our priority.  As part of our continuing mission to provide you with exceptional heart care, we have created designated Provider Care Teams.  These Care Teams include your primary Cardiologist (physician) and Advanced Practice Providers (  APPs -  Physician Assistants and Nurse Practitioners) who all work together to provide you with the care you need, when you need it.   Your next appointment:   12-16 week(s)  Provider:   Thomasene Ripple, DO   Other Instructions Please take your blood pressure daily for 2 weeks and send in a MyChart message. Please include heart rates. (One message at the end of the 2 weeks).   HOW TO TAKE YOUR BLOOD PRESSURE: Rest 5 minutes before taking your blood pressure. Don't smoke or drink caffeinated beverages for at least 30 minutes before. Take your blood pressure before (not after) you eat. Sit comfortably with your back supported and both feet on the floor (don't cross your legs). Elevate your arm to heart level on a table or a desk. Use the proper sized cuff. It should fit smoothly and snugly around your bare upper arm. There should be enough room to slip a fingertip under the cuff. The bottom edge of the cuff should be 1 inch  above the crease of the elbow. Ideally, take 3 measurements at one sitting and record the average.        Adopting a Healthy Lifestyle.  Know what a healthy weight is for you (roughly BMI <25) and aim to maintain this   Aim for 7+ servings of fruits and vegetables daily   65-80+ fluid ounces of water or unsweet tea for healthy kidneys   Limit to max 1 drink of alcohol per day; avoid smoking/tobacco   Limit animal fats in diet for cholesterol and heart health - choose grass fed whenever available   Avoid highly processed foods, and foods high in saturated/trans fats   Aim for low stress - take time to unwind and care for your mental health   Aim for 150 min of moderate intensity exercise weekly for heart health, and weights twice weekly for bone health   Aim for 7-9 hours of sleep daily   When it comes to diets, agreement about the perfect plan isnt easy to find, even among the experts. Experts at the [facility] of [employer] developed an idea known as the Healthy Eating Plate. Just imagine a plate divided into logical, healthy portions.   The emphasis is on diet quality:   Load up on vegetables and fruits - one-half of your plate: Aim for color and variety, and remember that potatoes dont count.   Go for whole grains - one-quarter of your plate: Whole wheat, barley, wheat berries, quinoa, oats, brown rice, and foods made with them. If you want pasta, go with whole wheat pasta.   Protein power - one-quarter of your plate: Fish, chicken, beans, and nuts are all healthy, versatile protein sources. Limit red meat.   The diet, however, does go beyond the plate, offering a few other suggestions.   Use healthy plant oils, such as olive, canola, soy, corn, sunflower and peanut. Check the labels, and avoid partially hydrogenated oil, which have unhealthy trans fats.   If youre thirsty, drink water. Coffee and tea are good in moderation, but skip sugary drinks and limit milk  and dairy products to one or two daily servings.   The type of carbohydrate in the diet is more important than the amount. Some sources of carbohydrates, such as vegetables, fruits, whole grains, and beans-are healthier than others.   Finally, stay active  Signed, Thomasene Ripple, DO  12/11/2023 3:00 PM    [facility]

## 2023-12-18 ENCOUNTER — Other Ambulatory Visit: Payer: Self-pay | Admitting: Family Medicine

## 2023-12-18 DIAGNOSIS — I10 Essential (primary) hypertension: Secondary | ICD-10-CM

## 2024-01-27 ENCOUNTER — Ambulatory Visit (HOSPITAL_COMMUNITY)
Admission: RE | Admit: 2024-01-27 | Discharge: 2024-01-27 | Disposition: A | Discharge status: Home / Self Care | Source: Ambulatory Visit | Attending: Cardiology | Admitting: Cardiology

## 2024-01-27 ENCOUNTER — Ambulatory Visit (HOSPITAL_BASED_OUTPATIENT_CLINIC_OR_DEPARTMENT_OTHER)
Admission: RE | Admit: 2024-01-27 | Discharge: 2024-01-27 | Disposition: A | Discharge status: Home / Self Care | Source: Ambulatory Visit | Attending: Cardiology | Admitting: Cardiology

## 2024-01-27 DIAGNOSIS — I119 Hypertensive heart disease without heart failure: Secondary | ICD-10-CM | POA: Insufficient documentation

## 2024-01-27 LAB — ECHOCARDIOGRAM COMPLETE
AR max vel: 1.83 cm2
AV Area VTI: 1.78 cm2
AV Area mean vel: 1.7 cm2
AV Mean grad: 4 mmHg
AV Peak grad: 7.5 mmHg
Ao pk vel: 1.37 m/s
Area-P 1/2: 3.85 cm2
S' Lateral: 2.56 cm

## 2024-01-28 ENCOUNTER — Ambulatory Visit
Admission: RE | Admit: 2024-01-28 | Discharge: 2024-01-28 | Disposition: A | Discharge status: Home / Self Care | Source: Ambulatory Visit | Attending: Emergency Medicine | Admitting: Emergency Medicine

## 2024-01-28 ENCOUNTER — Ambulatory Visit: Payer: Self-pay | Admitting: Cardiology

## 2024-01-28 VITALS — BP 121/74 | HR 71 | Temp 97.4°F | Resp 16

## 2024-01-28 DIAGNOSIS — Z8709 Personal history of other diseases of the respiratory system: Secondary | ICD-10-CM

## 2024-01-28 DIAGNOSIS — J0100 Acute maxillary sinusitis, unspecified: Secondary | ICD-10-CM | POA: Diagnosis not present

## 2024-01-28 MED ORDER — DOXYCYCLINE HYCLATE 100 MG PO CAPS: 100.0000 mg | ORAL_CAPSULE | Freq: Two times a day (BID) | ORAL | 0 refills | Status: AC

## 2024-01-28 MED ORDER — ALBUTEROL SULFATE HFA 108 (90 BASE) MCG/ACT IN AERS: 1.0000 | INHALATION_SPRAY | Freq: Four times a day (QID) | RESPIRATORY_TRACT | 0 refills | Status: AC | PRN

## 2024-01-28 NOTE — ED Provider Notes (Signed)
 Cynthia Crawford    CSN: [id number] Arrival date & time: 01/28/24  0941      History   Chief Complaint Chief Complaint  Patient presents with   Ear Fullness    Ear pain, cough and congestion. - Entered by patient    HPI Cynthia Crawford is a 33 y.o. female. Patient presents with 4-5 day history of congestion, sinus pressure, ear pain, cough.  Her nasal mucus is thick and yellow.  No fever or shortness of breath.  Treatment attempted with Tylenol  and OTC ear wax kit.  Patient reports history of asthma.  She does not have an albuterol  inhaler at this time.  She denies wheezing.  The history is provided by the patient and medical records.    Past Medical History:  Diagnosis Date   Asthma    Contact dermatitis due to soap 11/20/2017   GERD (gastroesophageal reflux disease) 07/13/2021   History of gestational hypertension    History of kidney stones 01/16/2019   Hypertension 08/29/2017   Intrinsic eczema 08/05/2018   Migraine 09/25/2011    Patient Active Problem List   Diagnosis Date Noted   Anxiety as acute reaction to gross stress 10/08/2023   Prediabetes 01/05/2022   Overweight 09/01/2021   Benign essential hypertension 08/29/2017    Past Surgical History:  Procedure Laterality Date   TONSILLECTOMY      OB History     Gravida  4   Para  4   Term  4   Preterm  0   AB  0   Living  4      SAB  0   IAB  0   Ectopic  0   Multiple  0   Live Births  4            Home Medications    Prior to Admission medications   Medication Sig Start Date End Date Taking? Authorizing Provider  albuterol  (VENTOLIN  HFA) 108 (90 Base) MCG/ACT inhaler Inhale 1-2 puffs into the lungs every 6 (six) hours as needed. 01/28/24  Yes Wellington Half, NP  doxycycline (VIBRAMYCIN) 100 MG capsule Take 1 capsule (100 mg total) by mouth 2 (two) times daily for 7 days. 01/28/24 02/04/24 Yes Wellington Half, NP  amLODipine  (NORVASC ) 5 MG tablet Take 1 tablet (5 mg  total) by mouth daily. Patient not taking: Reported on 01/28/2024 12/10/23 03/09/24  Tobb, Kardie, DO  busPIRone  (BUSPAR ) 5 MG tablet Take 1 tablet (5 mg total) by mouth 2 (two) times daily. 10/08/23   Simmons-Robinson, Judyann Number, MD  cetirizine  (ZYRTEC ) 10 MG tablet Take 10 mg by mouth daily.    [provider]  metoprolol  succinate (TOPROL -XL) 25 MG 24 hr tablet Take 1 tablet (25 mg total) by mouth daily. 09/24/23   Simmons-Robinson, Makiera, MD  spironolactone  (ALDACTONE ) 25 MG tablet TAKE 1 TABLET (25 MG TOTAL) BY MOUTH DAILY. 12/19/23   Simmons-Robinson, Judyann Number, MD    Family History Family History  Problem Relation Age of Onset   Asthma Mother    Diabetes Father    Hypertension Maternal Grandmother    Heart disease Neg Hx    Stroke Neg Hx     Social History Social History   Tobacco Use   Smoking status: Never   Smokeless tobacco: Never  Vaping Use   Vaping status: Never Used  Substance Use Topics   Alcohol use: No   Drug use: No     Allergies   Enalapril , Hydrochlorothiazide , Penicillins, and  Sulfa antibiotics   Review of Systems Review of Systems  Constitutional:  Negative for chills and fever.  HENT:  Positive for congestion, ear pain and sinus pressure. Negative for sore throat.   Respiratory:  Positive for cough. Negative for shortness of breath.      Physical Exam Triage Vital Signs ED Triage Vitals  Encounter Vitals Group     BP 01/28/24 0954 121/74     Systolic BP Percentile --      Diastolic BP Percentile --      Pulse Rate 01/28/24 0954 71     Resp 01/28/24 0954 16     Temp 01/28/24 0954 (!) 97.4 F (36.3 C)     Temp src --      SpO2 01/28/24 0954 98 %     Weight --      Height --      Head Circumference --      Peak Flow --      Pain Score 01/28/24 0948 5     Pain Loc --      Pain Education --      Exclude from Growth Chart --    No data found.  Updated Vital Signs BP 121/74   Pulse 71   Temp (!) 97.4 F (36.3 C)   Resp 16   LMP  01/05/2024   SpO2 98%   Visual Acuity Right Eye Distance:   Left Eye Distance:   Bilateral Distance:    Right Eye Near:   Left Eye Near:    Bilateral Near:     Physical Exam Constitutional:      General: She is not in acute distress. HENT:     Right Ear: Tympanic membrane normal.     Left Ear: Tympanic membrane normal.     Nose: Congestion and rhinorrhea present.     Mouth/Throat:     Mouth: Mucous membranes are moist.     Pharynx: Oropharynx is clear.  Cardiovascular:     Rate and Rhythm: Normal rate and regular rhythm.     Heart sounds: Normal heart sounds.  Pulmonary:     Effort: Pulmonary effort is normal. No respiratory distress.     Breath sounds: Normal breath sounds. No wheezing.  Neurological:     Mental Status: She is alert.      UC Treatments / Results  Labs (all labs ordered are listed, but only abnormal results are displayed) Labs Reviewed - No data to display  EKG   Radiology ECHOCARDIOGRAM COMPLETE Result Date: 01/27/2024    ECHOCARDIOGRAM REPORT   Patient Name:   Cynthia Crawford Date of Exam: 01/27/2024 Medical Rec #:  [id number]                Height:       64.0 in Accession #:    [id number]               Weight:       152.2 lb Date of Birth:  1991/09/12                BSA:          1.742 m Patient Age:    32 years                 BP:           158/98 mmHg Patient Gender: F  HR:           62 bpm. Exam Location:  Outpatient Procedure: 2D Echo, Cardiac Doppler and Color Doppler (Both Spectral and Color            Flow Doppler were utilized during procedure). Indications:    Murmur  History:        Patient has no prior history of Echocardiogram examinations.                 Risk Factors:Hypertension and Non-Smoker.  Sonographer:    Delford Felling MHA, RDMS, RVT, RDCS Referring Phys: [id number] KARDIE TOBB IMPRESSIONS  1. E' is 16 cm/s. Left ventricular ejection fraction, by estimation, is 60 to 65%. Left ventricular ejection  fraction by 3D volume is 62 %. The left ventricle has normal function. The left ventricle has no regional wall motion abnormalities. Left ventricular diastolic parameters were normal. The average left ventricular global longitudinal strain is -18.5 %. The global longitudinal strain is normal.  2. Normal RV free wall strain (-29.2%). Right ventricular systolic function is normal. The right ventricular size is normal.  3. The mitral valve is normal in structure. No evidence of mitral valve regurgitation. No evidence of mitral stenosis.  4. The aortic valve is tricuspid. Aortic valve regurgitation is not visualized. No aortic stenosis is present.  5. The inferior vena cava is dilated in size with >50% respiratory variability, suggesting right atrial pressure of 8 mmHg. Comparison(s): No prior Echocardiogram. Conclusion(s)/Recommendation(s): Normal biventricular function without evidence of hemodynamically significant valvular heart disease. FINDINGS  Left Ventricle: E' is 16 cm/s. Left ventricular ejection fraction, by estimation, is 60 to 65%. Left ventricular ejection fraction by 3D volume is 62 %. The left ventricle has normal function. The left ventricle has no regional wall motion abnormalities. The average left ventricular global longitudinal strain is -18.5 %. Strain was performed and the global longitudinal strain is normal. The left ventricular internal cavity size was normal in size. There is no left ventricular hypertrophy. Left ventricular diastolic parameters were normal. Right Ventricle: Normal RV free wall strain (-29.2%). The right ventricular size is normal. No increase in right ventricular wall thickness. Right ventricular systolic function is normal. Left Atrium: Left atrial size was normal in size. Right Atrium: Right atrial size was normal in size. Pericardium: There is no evidence of pericardial effusion. Mitral Valve: The mitral valve is normal in structure. No evidence of mitral valve  regurgitation. No evidence of mitral valve stenosis. Tricuspid Valve: The tricuspid valve is grossly normal. Tricuspid valve regurgitation is trivial. No evidence of tricuspid stenosis. Aortic Valve: The aortic valve is tricuspid. Aortic valve regurgitation is not visualized. No aortic stenosis is present. Aortic valve mean gradient measures 4.0 mmHg. Aortic valve peak gradient measures 7.5 mmHg. Aortic valve area, by VTI measures 1.78 cm. Pulmonic Valve: The pulmonic valve was normal in structure. Pulmonic valve regurgitation is not visualized. No evidence of pulmonic stenosis. Aorta: The aortic root is normal in size and structure. Venous: The inferior vena cava is dilated in size with greater than 50% respiratory variability, suggesting right atrial pressure of 8 mmHg. IAS/Shunts: No atrial level shunt detected by color flow Doppler. Additional Comments: 3D was performed not requiring image post processing on an independent workstation and was normal.  LEFT VENTRICLE PLAX 2D LVIDd:         4.20 cm         Diastology LVIDs:         2.56 cm  LV e' medial:    12.10 cm/s LV PW:         0.77 cm         LV E/e' medial:  8.0 LV IVS:        0.84 cm         LV e' lateral:   19.70 cm/s LVOT diam:     1.64 cm         LV E/e' lateral: 4.9 LV SV:         53 LV SV Index:   30              2D Longitudinal LVOT Area:     2.11 cm        Strain                                2D Strain GLS   -17.3 %                                (A4C):                                2D Strain GLS   -21.2 %                                (A3C):                                2D Strain GLS   -16.9 %                                (A2C):                                2D Strain GLS   -18.5 %                                Avg:                                 3D Volume EF                                LV 3D EF:    Left                                             ventricul                                             ar  ejection                                             fraction                                             by 3D                                             volume is                                             62 %.                                 3D Volume EF:                                3D EF:        62 %                                LV EDV:       123 ml                                LV ESV:       47 ml                                LV SV:        76 ml RIGHT VENTRICLE             IVC RV Basal diam:  3.20 cm     IVC diam: 2.54 cm RV Mid diam:    2.42 cm RV S prime:     13.10 cm/s TAPSE (M-mode): 2.4 cm LEFT ATRIUM             Index        RIGHT ATRIUM           Index LA diam:        2.61 cm 1.50 cm/m   RA Area:     11.30 cm LA Vol (A2C):   26.2 ml 15.04 ml/m  RA Volume:   25.80 ml  14.81 ml/m LA Vol (A4C):   24.9 ml 14.29 ml/m LA Biplane Vol: 26.9 ml 15.44 ml/m  AORTIC VALVE AV Area (Vmax):    1.83 cm AV Area (Vmean):   1.70 cm AV Area (VTI):     1.78 cm AV Vmax:           137.00 cm/s AV Vmean:          92.600 cm/s AV VTI:            0.298 m AV Peak Grad:  7.5 mmHg AV Mean Grad:      4.0 mmHg LVOT Vmax:         119.00 cm/s LVOT Vmean:        74.600 cm/s LVOT VTI:          0.251 m LVOT/AV VTI ratio: 0.84  AORTA Ao Root diam: 2.69 cm Ao Asc diam:  2.72 cm MITRAL VALVE MV Area (PHT): 3.85 cm    SHUNTS MV Decel Time: 197 msec    Systemic VTI:  0.25 m MV E velocity: 97.00 cm/s  Systemic Diam: 1.64 cm MV A velocity: 72.40 cm/s MV E/A ratio:  1.34 Dinah Franco MD Electronically signed by Dinah Franco MD Signature Date/Time: 01/27/2024/12:16:24 PM    Final    VAS US  RENAL ARTERY DUPLEX Result Date: 01/27/2024 ABDOMINAL VISCERAL Patient Name:  Cynthia Crawford  Date of Exam:   01/27/2024 Medical Rec #: [id number]                 Accession #:    [id number] Date of Birth: Jul 31, 1991                 Patient Gender: F Patient Age:   30 years Exam Location:  [address]  Procedure:      VAS US  RENAL ARTERY DUPLEX Referring Phys: [id number] KARDIE TOBB -------------------------------------------------------------------------------- Indications: Hypertension High Risk Factors: Hypertension, no history of smoking. Limitations: Air/bowel gas. Comparison Study: No prior study Performing Technologist: Delford Felling MHA, RDMS, RVT, RDCS  Examination Guidelines: A complete evaluation includes B-mode imaging, spectral Doppler, color Doppler, and power Doppler as needed of all accessible portions of each vessel. Bilateral testing is considered an integral part of a complete examination. Limited examinations for reoccurring indications may be performed as noted.  Duplex Findings: +--------------------+--------+--------+------+-------------+ Mesenteric          PSV cm/sEDV cm/sPlaque  Comments    +--------------------+--------+--------+------+-------------+ Aorta Prox            140                               +--------------------+--------+--------+------+-------------+ Aorta Mid             120                               +--------------------+--------+--------+------+-------------+ Celiac Artery Origin  231                 Widely patent +--------------------+--------+--------+------+-------------+ SMA Proximal          134      25                       +--------------------+--------+--------+------+-------------+    +------------------+--------+--------+-------+ Right Renal ArteryPSV cm/sEDV cm/sComment +------------------+--------+--------+-------+ Origin              129      50           +------------------+--------+--------+-------+ Proximal            165      54           +------------------+--------+--------+-------+ Mid                 134      44           +------------------+--------+--------+-------+ Distal  155      56           +------------------+--------+--------+-------+  +-----------------+--------+--------+-------+ Left Renal ArteryPSV cm/sEDV cm/sComment +-----------------+--------+--------+-------+ Origin             131      34           +-----------------+--------+--------+-------+ Proximal           105      37           +-----------------+--------+--------+-------+ Mid                129      48           +-----------------+--------+--------+-------+ Distal             101      36           +-----------------+--------+--------+-------+ +------------+--------+--------+----+-----------+--------+--------+----+ Right KidneyPSV cm/sEDV cm/sRI  Left KidneyPSV cm/sEDV cm/sRI   +------------+--------+--------+----+-----------+--------+--------+----+ Upper Pole  33      11      0.66Upper Pole 34      12      0.63 +------------+--------+--------+----+-----------+--------+--------+----+ Mid         22      7       0.        41      16      0.62 +------------+--------+--------+----+-----------+--------+--------+----+ Lower Pole  19      7       0.61Lower Pole 25      10      0.61 +------------+--------+--------+----+-----------+--------+--------+----+ Hilar       35      10      0.71Hilar      70      23      0.67 +------------+--------+--------+----+-----------+--------+--------+----+ +------------------+-----+------------------+----+ Right Kidney           Left Kidney            +------------------+-----+------------------+----+ RAR                    RAR                    +------------------+-----+------------------+----+ RAR (manual)      1.38 RAR (manual)      1.09 +------------------+-----+------------------+----+ Cortex                 Cortex                 +------------------+-----+------------------+----+ Cortex thickness       Corex thickness        +------------------+-----+------------------+----+ Kidney length (cm)10.50Kidney length (cm)      +------------------+-----+------------------+----+  Summary: Renal:  Right: No evidence of right renal artery stenosis. RRV flow present.        The right renal vein is dilated without phasicity, velocity        of 33cm/s, proximal to the area of the SMA, with return of        phasicity and elevated velocities of 131 cm/s distal to the        area of the SMA, prior to it draining into the IVC. 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 Left:  No evidence of left renal artery stenosis. LRV flow present.  *  See table(s) above for measurements and observations.  Diagnosing physician: Armida Lander MD    Preliminary     Procedures Procedures (including critical care time)  Medications Ordered in UC Medications - No data to display  Initial Impression / Assessment and Plan / UC Course  I have reviewed the triage vital signs and the nursing notes.  Pertinent labs & imaging results that were available during my care of the patient were reviewed by me and considered in my medical decision making (see chart for details).    Acute sinusitis, history of asthma.  Afebrile and vital signs are stable.  Lungs are clear and O2 sat is 98% on room air.  Treating sinus infection with doxycycline (allergy to penicillin and sulfa).  Refill of albuterol  inhaler also sent to pharmacy.  Instructed patient to follow-up with her PCP if she is not improving.  She agrees to plan of care.  Final Clinical Impressions(s) / UC Diagnoses   Final diagnoses:  Acute non-recurrent maxillary sinusitis  History of asthma     Discharge Instructions      Take the doxycycline as directed.  If needed, use the albuterol  inhaler as directed.  Follow-up with your primary care provider if your symptoms are not improving.    ED Prescriptions     Medication Sig Dispense Auth. Provider   albuterol  (VENTOLIN  HFA) 108  (90 Base) MCG/ACT inhaler Inhale 1-2 puffs into the lungs every 6 (six) hours as needed. 18 g Wellington Half, NP   doxycycline (VIBRAMYCIN) 100 MG capsule Take 1 capsule (100 mg total) by mouth 2 (two) times daily for 7 days. 14 capsule Wellington Half, NP      PDMP not reviewed this encounter.   Wellington Half, NP 01/28/24 1018

## 2024-01-28 NOTE — ED Triage Notes (Signed)
 Patient to Urgent Care with complaints of right sided ear pain/ nasal congestion w/ bloody and yellow mucus/ sinus pain.   Symptoms started Friday.   Meds: tylenol  attempted ear lavage w/ [facility] kit.

## 2024-01-28 NOTE — Discharge Instructions (Addendum)
 Take the doxycycline as directed.  If needed, use the albuterol  inhaler as directed.  Follow-up with your primary care provider if your symptoms are not improving.

## 2024-02-24 ENCOUNTER — Ambulatory Visit (INDEPENDENT_AMBULATORY_CARE_PROVIDER_SITE_OTHER): Payer: 59 | Admitting: Family Medicine

## 2024-02-24 ENCOUNTER — Encounter: Payer: Self-pay | Admitting: Family Medicine

## 2024-02-24 VITALS — BP 129/83 | HR 64 | Ht 64.0 in | Wt 158.0 lb

## 2024-02-24 DIAGNOSIS — Z0001 Encounter for general adult medical examination with abnormal findings: Secondary | ICD-10-CM | POA: Diagnosis not present

## 2024-02-24 DIAGNOSIS — Z13220 Encounter for screening for lipoid disorders: Secondary | ICD-10-CM

## 2024-02-24 DIAGNOSIS — R7303 Prediabetes: Secondary | ICD-10-CM | POA: Diagnosis not present

## 2024-02-24 DIAGNOSIS — E663 Overweight: Secondary | ICD-10-CM

## 2024-02-24 DIAGNOSIS — Z23 Encounter for immunization: Secondary | ICD-10-CM | POA: Diagnosis not present

## 2024-02-24 DIAGNOSIS — I10 Essential (primary) hypertension: Secondary | ICD-10-CM

## 2024-02-24 DIAGNOSIS — Z130 Encounter for screening for diseases of the blood and blood-forming organs and certain disorders involving the immune mechanism: Secondary | ICD-10-CM | POA: Diagnosis not present

## 2024-02-24 DIAGNOSIS — E559 Vitamin D deficiency, unspecified: Secondary | ICD-10-CM

## 2024-02-24 DIAGNOSIS — Z0000 Encounter for general adult medical examination without abnormal findings: Secondary | ICD-10-CM | POA: Insufficient documentation

## 2024-02-24 NOTE — Assessment & Plan Note (Addendum)
 Annual Physical Examination Routine annual physical examination with concern for vitamin D  deficiency due to extreme fatigue, described as falling asleep at her desk. Previous vitamin D  level in September was 42, which was normal. - Order vitamin D  level - Order CBC to check for anemia - Order thyroid  function tests - Order A1c and cholesterol levels - AVS provided including recommendations for 150 mins of exercise per week with well balanced diet given to pt in handout   - Administered pneumococcal vaccine

## 2024-02-24 NOTE — Assessment & Plan Note (Signed)
 Hypertension, chronic Hypertension managed with metoprolol  25 mg and spironolactone . Amlodipine  was previously tried but caused adverse effects, impacting her household peace. Blood pressure at home was 117/unknown, but higher in the office due to stress. Decision made to continue current medications without amlodipine . - Continue metoprolol  25 mg - Continue spironolactone  - Discontinue amlodipine  due to adverse effects

## 2024-02-24 NOTE — Progress Notes (Signed)
 Complete physical exam   Patient: Cynthia Crawford   DOB: 05/04/1991   33 y.o. Female  MRN: [id number] Visit Date: 02/24/2024  Today's healthcare provider: Mimi Alt, MD   Chief Complaint  Patient presents with   Annual Exam    Would like Vit D levels checked   Care Management    Pattern of eating  General   Are you exercising:No   Vaccine: denied    Subjective    Cynthia Crawford is a 33 y.o. female who presents today for a complete physical exam.     Discussed the use of AI scribe software for clinical note transcription with the patient, who gave verbal consent to proceed.  History of Present Illness Cynthia Crawford is a 33 year old female who presents for her annual physical examination and vitamin D  level check.  She experiences extreme fatigue, describing it as 'falling asleep at my desk tired'. Her vitamin D  level was previously checked in September and was 62, which was considered good at that time.  She has a history of hypertension, currently managed with metoprolol  25 mg and spironolactone . She previously tried amlodipine  but discontinued it due to adverse effects. Her blood pressure readings at home are around 117, although she had a stressful morning today.  She has a history of asthma, which is relevant to her consideration for a pneumococcal vaccine. No current respiratory symptoms are present.  She reports shoulder pain, which she attributes to sleeping wrong, and describes her back as feeling puffy and tight. Her five-year-old child also commented that it feels puffy.     Past Medical History:  Diagnosis Date   Asthma    Contact dermatitis due to soap 11/20/2017   GERD (gastroesophageal reflux disease) 07/13/2021   History of gestational hypertension    History of kidney stones 01/16/2019   Hypertension 08/29/2017   Intrinsic eczema 08/05/2018   Migraine 09/25/2011   Past Surgical History:  Procedure  Laterality Date   TONSILLECTOMY     Social History   Socioeconomic History   Marital status: Married    Spouse name: Shawn   Number of children: 3   Years of education: Not on file   Highest education level: Not on file  Occupational History   Occupation: Chiropodist    Comment: [employer]  Tobacco Use   Smoking status: Never   Smokeless tobacco: Never  Vaping Use   Vaping status: Never Used  Substance and Sexual Activity   Alcohol use: No   Drug use: No   Sexual activity: Yes    Partners: Male    Birth control/protection: None    Comment: one month ago  Other Topics Concern   Not on file  Social History Narrative   Not on file   Social Drivers of Health   Financial Resource Strain: Low Risk  (02/24/2024)   Overall Financial Resource Strain (CARDIA)    Difficulty of Paying Living Expenses: Not hard at all  Food Insecurity: No Food Insecurity (02/24/2024)   Hunger Vital Sign    Worried About Running Out of Food in the Last Year: Never true    Ran Out of Food in the Last Year: Never true  Transportation Needs: No Transportation Needs (02/24/2024)   PRAPARE - Administrator, Civil Service (Medical): No    Lack of Transportation (Non-Medical): No  Physical Activity: Inactive (08/28/2017)   Exercise Vital Sign    Days of Exercise  per Week: 0 days    Minutes of Exercise per Session: 0 min  Stress: No Stress Concern Present (02/24/2024)   [employer] of Occupational Health - Occupational Stress Questionnaire    Feeling of Stress : Not at all  Social Connections: Socially Integrated (08/28/2017)   Social Connection and Isolation Panel [NHANES]    Frequency of Communication with Friends and Family: More than three times a week    Frequency of Social Gatherings with Friends and Family: More than three times a week    Attends Religious Services: More than 4 times per year    Active Member of [employer] or Organizations: Yes    Attends Museum/gallery exhibitions officer: More than 4 times per year    Marital Status: Married  Catering manager Violence: Not At Risk (02/24/2024)   Humiliation, Afraid, Rape, and Kick questionnaire    Fear of Current or Ex-Partner: No    Emotionally Abused: No    Physically Abused: No    Sexually Abused: No   Family Status  Relation Name Status   Mother  Alive   Father  Alive   Sister  Alive   Sister  Alive   MGM  Alive   MGF  Deceased   PGM  Deceased   PGF  Deceased   Neg Hx  (Not Specified)  No partnership data on file   Family History  Problem Relation Age of Onset   Asthma Mother    Diabetes Father    Hypertension Maternal Grandmother    Heart disease Neg Hx    Stroke Neg Hx    Allergies  Allergen Reactions   Enalapril  Swelling   Hydrochlorothiazide  Other (See Comments)    Dizziness when climbing stairs (up/down)   Penicillins Hives    Has patient had a PCN reaction causing immediate rash, facial/tongue/throat swelling, SOB or lightheadedness with hypotension: Yes Has patient had a PCN reaction causing severe rash involving mucus membranes or skin necrosis: No Has patient had a PCN reaction that required hospitalization No Has patient had a PCN reaction occurring within the last 10 years: No If all of the above answers are "NO", then may proceed with Cephalosporin use.   Sulfa Antibiotics Hives     Medications: Outpatient Medications Prior to Visit  Medication Sig   albuterol  (VENTOLIN  HFA) 108 (90 Base) MCG/ACT inhaler Inhale 1-2 puffs into the lungs every 6 (six) hours as needed.   busPIRone  (BUSPAR ) 5 MG tablet Take 1 tablet (5 mg total) by mouth 2 (two) times daily.   cetirizine  (ZYRTEC ) 10 MG tablet Take 10 mg by mouth daily.   metoprolol  succinate (TOPROL -XL) 25 MG 24 hr tablet Take 1 tablet (25 mg total) by mouth daily.   spironolactone  (ALDACTONE ) 25 MG tablet TAKE 1 TABLET (25 MG TOTAL) BY MOUTH DAILY.   [DISCONTINUED] amLODipine  (NORVASC ) 5 MG tablet Take 1 tablet (5  mg total) by mouth daily. (Patient not taking: Reported on 02/24/2024)   No facility-administered medications prior to visit.    Review of Systems  Last CBC Lab Results  Component Value Date   WBC 7.4 09/30/2023   HGB 12.4 09/30/2023   HCT 38.5 09/30/2023   MCV 89.3 09/30/2023   MCH 28.8 09/30/2023   RDW 12.7 09/30/2023   PLT 183 09/30/2023   Last metabolic panel Lab Results  Component Value Date   GLUCOSE 118 (H) 09/30/2023   NA 141 09/30/2023   K 3.5 09/30/2023   CL 110 09/30/2023   CO2  24 09/30/2023   BUN 14 09/30/2023   CREATININE 0.78 09/30/2023   GFRNONAA >60 09/30/2023   CALCIUM 9.5 09/30/2023   PROT 7.4 09/24/2023   ALBUMIN 4.8 09/24/2023   LABGLOB 2.6 09/24/2023   AGRATIO 1.5 07/20/2022   BILITOT 0.2 09/24/2023   ALKPHOS 65 09/24/2023   AST 15 09/24/2023   ALT 7 09/24/2023   ANIONGAP 7 09/30/2023   Last lipids Lab Results  Component Value Date   CHOL 213 (H) 07/20/2022   HDL 81 07/20/2022   LDLCALC 111 (H) 07/20/2022   TRIG 119 07/20/2022   CHOLHDL 2.6 07/20/2022   Last hemoglobin A1c Lab Results  Component Value Date   HGBA1C 5.8 (H) 05/30/2023   Last thyroid  functions Lab Results  Component Value Date   TSH 1.15 08/08/2023   Last vitamin D  Lab Results  Component Value Date   VD25OH 42.5 05/30/2023   Last vitamin B12 and Folate Lab Results  Component Value Date   VITAMINB12 730 05/30/2023        Objective    BP 129/83   Pulse 64   Ht 5\' 4"  (1.626 m)   Wt 158 lb (71.7 kg)   LMP 01/05/2024   SpO2 99%   BMI 27.12 kg/m  BP Readings from Last 3 Encounters:  02/24/24 129/83  01/28/24 121/74  12/10/23 (!) 158/98   Wt Readings from Last 3 Encounters:  02/24/24 158 lb (71.7 kg)  12/10/23 152 lb 3.2 oz (69 kg)  10/24/23 149 lb (67.6 kg)        Physical Exam Vitals reviewed.  Constitutional:      General: She is not in acute distress.    Appearance: Normal appearance. She is not ill-appearing, toxic-appearing or  diaphoretic.  HENT:     Head: Normocephalic and atraumatic.     Right Ear: Tympanic membrane and external ear normal. There is no impacted cerumen.     Left Ear: Tympanic membrane and external ear normal. There is no impacted cerumen.     Nose: Nose normal.     Mouth/Throat:     Pharynx: Oropharynx is clear.  Eyes:     General: No scleral icterus.    Extraocular Movements: Extraocular movements intact.     Conjunctiva/sclera: Conjunctivae normal.     Pupils: Pupils are equal, round, and reactive to light.  Cardiovascular:     Rate and Rhythm: Normal rate and regular rhythm.     Pulses: Normal pulses.     Heart sounds: Normal heart sounds. No murmur heard.    No friction rub. No gallop.  Pulmonary:     Effort: Pulmonary effort is normal. No respiratory distress.     Breath sounds: Normal breath sounds. No wheezing, rhonchi or rales.  Abdominal:     General: Bowel sounds are normal. There is no distension.     Palpations: Abdomen is soft. There is no mass.     Tenderness: There is no abdominal tenderness. There is no guarding.  Musculoskeletal:        General: No deformity.       Arms:     Cervical back: Normal range of motion and neck supple.     Right lower leg: No edema.     Left lower leg: No edema.  Lymphadenopathy:     Cervical: No cervical adenopathy.  Skin:    General: Skin is warm.     Capillary Refill: Capillary refill takes less than 2 seconds.     Findings: No erythema  or rash.  Neurological:     General: No focal deficit present.     Mental Status: She is alert and oriented to person, place, and time.     Cranial Nerves: Cranial nerves 2-12 are intact. No cranial nerve deficit or facial asymmetry.     Motor: Motor function is intact. No weakness.     Gait: Gait normal.  Psychiatric:        Mood and Affect: Mood normal.        Behavior: Behavior normal.       Last depression screening scores    02/24/2024    9:39 AM 10/24/2023   10:30 AM 10/08/2023    3:00  PM  PHQ 2/9 Scores  PHQ - 2 Score 1 0 1  PHQ- 9 Score 3  2    Last fall risk screening    10/24/2023   10:30 AM  Fall Risk   Falls in the past year? 0    Last Audit-C alcohol use screening    02/24/2024    9:37 AM  Alcohol Use Disorder Test (AUDIT)  1. How often do you have a drink containing alcohol? 0  2. How many drinks containing alcohol do you have on a typical day when you are drinking? 0  3. How often do you have six or more drinks on one occasion? 0  AUDIT-C Score 0   A score of 3 or more in women, and 4 or more in men indicates increased risk for alcohol abuse, EXCEPT if all of the points are from question 1   No results found for any visits on 02/24/24.  Assessment & Plan    Routine Health Maintenance and Physical Exam  Immunization History  Administered Date(s) Administered   HPV Quadrivalent 02/28/2009, 11/24/2009, 04/06/2010   Influenza Split 09/25/2011   Influenza, Seasonal, Injecte, Preservative Fre 09/26/2012, 06/21/2023   Influenza,inj,Quad PF,6+ Mos 08/26/2013, 06/27/2015, 10/17/2016, 06/10/2018, 06/12/2019, 09/19/2020, 07/13/2021, 07/04/2022   Moderna Sars-Covid-2 Vaccination 11/14/2019, 12/12/2019   PNEUMOCOCCAL CONJUGATE-20 02/24/2024   Tdap 11/14/2015, 03/05/2018, 08/04/2019, 05/16/2022    Health Maintenance  Topic Date Due   COVID-19 Vaccine (3 - 2024-25 season) 05/19/2023   INFLUENZA VACCINE  04/17/2024   Cervical Cancer Screening (HPV/Pap Cotest)  07/04/2026   DTaP/Tdap/Td (5 - Td or Tdap) 05/16/2032   Pneumococcal Vaccine 73-58 Years old  Completed   HPV VACCINES  Completed   Hepatitis C Screening  Completed   HIV Screening  Completed   Meningococcal B Vaccine  Aged Out    Problem List Items Addressed This Visit       Cardiovascular and Mediastinum   Benign essential hypertension   Hypertension, chronic Hypertension managed with metoprolol  25 mg and spironolactone . Amlodipine  was previously tried but caused adverse effects, impacting  her household peace. Blood pressure at home was 117/unknown, but higher in the office due to stress. Decision made to continue current medications without amlodipine . - Continue metoprolol  25 mg - Continue spironolactone  - Discontinue amlodipine  due to adverse effects      Relevant Orders   TSH+T4F+T3Free     Other   Prediabetes   Relevant Orders   Hemoglobin A1c   Overweight   Annual physical exam - Primary   Annual Physical Examination Routine annual physical examination with concern for vitamin D  deficiency due to extreme fatigue, described as falling asleep at her desk. Previous vitamin D  level in September was 42, which was normal. - Order vitamin D  level - Order CBC to check for  anemia - Order thyroid  function tests - Order A1c and cholesterol levels - AVS provided including recommendations for 150 mins of exercise per week with well balanced diet given to pt in handout   - Administered pneumococcal vaccine      Other Visit Diagnoses       Screening for deficiency anemia       Relevant Orders   CBC     Screening, lipid       Relevant Orders   Lipid panel     Vitamin D  deficiency       Relevant Orders   VITAMIN D  25 Hydroxy (Vit-D Deficiency, Fractures)     Need for pneumococcal vaccine       Relevant Orders   Pneumococcal conjugate vaccine 20-valent (Prevnar 20) (Completed)        Assessment & Plan   Shoulder and Back Pain Acute shoulder and back pain, likely due to sleeping in an awkward position, with tightness in the trapezius area. - Apply heat to affected area          No follow-ups on file.       Mimi Alt, MD  [facility] [phone] (phone) [phone] (fax)  [facility]

## 2024-02-25 LAB — LIPID PANEL
Chol/HDL Ratio: 2.3 ratio (ref 0.0–4.4)
Cholesterol, Total: 151 mg/dL (ref 100–199)
HDL: 66 mg/dL (ref >39)
LDL Chol Calc (NIH): 71 mg/dL (ref 0–99)
Triglycerides: 70 mg/dL (ref 0–149)
VLDL Cholesterol Cal: 14 mg/dL (ref 5–40)

## 2024-02-25 LAB — CBC
Hematocrit: 37.8 % (ref 34.0–46.6)
Hemoglobin: 12.2 g/dL (ref 11.1–15.9)
MCH: 29.6 pg (ref 26.6–33.0)
MCHC: 32.3 g/dL (ref 31.5–35.7)
MCV: 92 fL (ref 79–97)
Platelets: 150 10*3/uL (ref 150–450)
RBC: 4.12 x10E6/uL (ref 3.77–5.28)
RDW: 12.8 % (ref 11.7–15.4)
WBC: 5.2 10*3/uL (ref 3.4–10.8)

## 2024-02-25 LAB — HEMOGLOBIN A1C
Est. average glucose Bld gHb Est-mCnc: 114 mg/dL
Hgb A1c MFr Bld: 5.6 % (ref 4.8–5.6)

## 2024-02-25 LAB — TSH+T4F+T3FREE
Free T4: 1.1 ng/dL (ref 0.82–1.77)
T3, Free: 3.2 pg/mL (ref 2.0–4.4)
TSH: 0.825 u[IU]/mL (ref 0.450–4.500)

## 2024-02-25 LAB — VITAMIN D 25 HYDROXY (VIT D DEFICIENCY, FRACTURES): Vit D, 25-Hydroxy: 18.8 ng/mL — ABNORMAL LOW (ref 30.0–100.0)

## 2024-02-26 ENCOUNTER — Ambulatory Visit: Payer: Self-pay | Admitting: Family Medicine

## 2024-02-26 DIAGNOSIS — E559 Vitamin D deficiency, unspecified: Secondary | ICD-10-CM

## 2024-02-26 MED ORDER — VITAMIN D (ERGOCALCIFEROL) 1.25 MG (50000 UNIT) PO CAPS: 50000.0000 [IU] | ORAL_CAPSULE | ORAL | 0 refills | Status: DC

## 2024-03-16 ENCOUNTER — Other Ambulatory Visit: Payer: Self-pay | Admitting: Family Medicine

## 2024-03-16 DIAGNOSIS — I10 Essential (primary) hypertension: Secondary | ICD-10-CM

## 2024-03-17 NOTE — Telephone Encounter (Signed)
 Requested Prescriptions  Pending Prescriptions Disp Refills   spironolactone  (ALDACTONE ) 25 MG tablet [Pharmacy Med Name: SPIRONOLACTONE  25 MG TABLET] 90 tablet 0    Sig: TAKE 1 TABLET (25 MG TOTAL) BY MOUTH DAILY.     Cardiovascular: Diuretics - Aldosterone Antagonist Passed - 03/17/2024  3:31 PM      Passed - Cr in normal range and within 180 days    Creat  Date Value Ref Range Status  08/08/2023 0.63 0.50 - 0.97 mg/dL Final   Creatinine, Ser  Date Value Ref Range Status  09/30/2023 0.78 0.44 - 1.00 mg/dL Final   Creatinine, Urine  Date Value Ref Range Status  10/24/2019 31.20 mg/dL Final         Passed - K in normal range and within 180 days    Potassium  Date Value Ref Range Status  09/30/2023 3.5 3.5 - 5.1 mmol/L Final         Passed - Na in normal range and within 180 days    Sodium  Date Value Ref Range Status  09/30/2023 141 135 - 145 mmol/L Final  09/24/2023 142 134 - 144 mmol/L Final         Passed - eGFR is 30 or above and within 180 days    GFR calc Af Amer  Date Value Ref Range Status  09/19/2020 136 >59 mL/min/1.73 Final    Comment:    **In accordance with recommendations from the NKF-ASN Task force,**   [facility] is in the process of updating its eGFR calculation to the   2021 CKD-EPI creatinine equation that estimates kidney function   without a race variable.    GFR, Estimated  Date Value Ref Range Status  09/30/2023 >60 >60 mL/min Final    Comment:    (NOTE) Calculated using the CKD-EPI Creatinine Equation (2021)    eGFR  Date Value Ref Range Status  09/24/2023 110 >59 mL/min/1.73 Final         Passed - Last BP in normal range    BP Readings from Last 1 Encounters:  02/24/24 129/83         Passed - Valid encounter within last 6 months    Recent Outpatient Visits           3 weeks ago Annual physical exam   [facility] [facility] Simmons-Robinson, [place], MD   4 months ago Benign essential hypertension   [place]  [facility] Simmons-Robinson, [place], MD       Future Appointments             In 1 week Tobb, Kardie, DO [facility] [employer] [facility] [employer]. [facility] [employer]

## 2024-03-24 ENCOUNTER — Ambulatory Visit: Attending: Cardiology | Admitting: Cardiology

## 2024-03-24 ENCOUNTER — Encounter: Payer: Self-pay | Admitting: Cardiology

## 2024-03-24 VITALS — BP 128/76 | HR 58 | Ht 64.0 in | Wt 163.8 lb

## 2024-03-24 DIAGNOSIS — I10 Essential (primary) hypertension: Secondary | ICD-10-CM

## 2024-03-24 NOTE — Progress Notes (Signed)
 Cardiology Office Note:    Date:  03/24/2024   ID:  Cynthia Crawford, DOB 02/03/1991, MRN [id number]  PCP:  Sharma Coyer, MD  Cardiologist:  Dub Huntsman, DO  Electrophysiologist:  None   Referring MD: Sharma Bullocks*      History of Present Illness:    Cynthia Crawford is a 33 y.o. female a mother of four, was diagnosed with hypertension eight years ago following the birth of her first child. The hypertension persisted postpartum and has continued since. All four pregnancies were complicated by hypertension, but there were no instances of preeclampsia or gestational diabetes. The patient denies any plans for future pregnancies.  The patient has a history of allergies to hydrochlorothiazide  and enalapril , which caused lip swelling. She is currently on metoprolol  and aldactone , which were started simultaneously after previous medications proved ineffective. The patient has also previously taken amlodipine , but does not recall any adverse effects.  At her last visit with me I ordered an echocardiogram as well as a renal ultrasound.  Since that visit her echocardiogram was normal and her renal ultrasound did not show any evidence of renal artery stenosis.  She is here today for follow-up visit she offers no complaints at this time.  She is doing well from a cardiovascular standpoint.  We had previously discussed her testing result.  She has no questions.  She tells me at home her blood pressure is running within a normal range.   Past Medical History:  Diagnosis Date   Asthma    Contact dermatitis due to soap 11/20/2017   GERD (gastroesophageal reflux disease) 07/13/2021   History of gestational hypertension    History of kidney stones 01/16/2019   Hypertension 08/29/2017   Intrinsic eczema 08/05/2018   Migraine 09/25/2011    Past Surgical History:  Procedure Laterality Date   TONSILLECTOMY      Current Medications: Current Meds   Medication Sig   albuterol  (VENTOLIN  HFA) 108 (90 Base) MCG/ACT inhaler Inhale 1-2 puffs into the lungs every 6 (six) hours as needed.   busPIRone  (BUSPAR ) 5 MG tablet Take 1 tablet (5 mg total) by mouth 2 (two) times daily.   cetirizine  (ZYRTEC ) 10 MG tablet Take 10 mg by mouth daily.   metoprolol  succinate (TOPROL -XL) 25 MG 24 hr tablet Take 1 tablet (25 mg total) by mouth daily.   spironolactone  (ALDACTONE ) 25 MG tablet TAKE 1 TABLET (25 MG TOTAL) BY MOUTH DAILY.   Vitamin D , Ergocalciferol , (DRISDOL ) 1.25 MG (50000 UNIT) CAPS capsule Take 1 capsule (50,000 Units total) by mouth every 7 (seven) days.     Allergies:   Enalapril , Hydrochlorothiazide , Penicillins, and Sulfa antibiotics   Social History   Socioeconomic History   Marital status: Married    Spouse name: Shawn   Number of children: 3   Years of education: Not on file   Highest education level: Not on file  Occupational History   Occupation: Chiropodist    Comment: [employer]  Tobacco Use   Smoking status: Never   Smokeless tobacco: Never  Vaping Use   Vaping status: Never Used  Substance and Sexual Activity   Alcohol use: No   Drug use: No   Sexual activity: Yes    Partners: Male    Birth control/protection: None    Comment: one month ago  Other Topics Concern   Not on file  Social History Narrative   Not on file   Social Drivers of Corporate investment banker  Strain: Low Risk  (02/24/2024)   Overall Financial Resource Strain (CARDIA)    Difficulty of Paying Living Expenses: Not hard at all  Food Insecurity: No Food Insecurity (02/24/2024)   Hunger Vital Sign    Worried About Running Out of Food in the Last Year: Never true    Ran Out of Food in the Last Year: Never true  Transportation Needs: No Transportation Needs (02/24/2024)   PRAPARE - Administrator, Civil Service (Medical): No    Lack of Transportation (Non-Medical): No  Physical Activity: Inactive (08/28/2017)    Exercise Vital Sign    Days of Exercise per Week: 0 days    Minutes of Exercise per Session: 0 min  Stress: No Stress Concern Present (02/24/2024)   [employer] of Occupational Health - Occupational Stress Questionnaire    Feeling of Stress : Not at all  Social Connections: Socially Integrated (08/28/2017)   Social Connection and Isolation Panel    Frequency of Communication with Friends and Family: More than three times a week    Frequency of Social Gatherings with Friends and Family: More than three times a week    Attends Religious Services: More than 4 times per year    Active Member of [employer] or Organizations: Yes    Attends Engineer, structural: More than 4 times per year    Marital Status: Married     Family History: The patient's family history includes Asthma in her mother; Diabetes in her father; Hypertension in her maternal grandmother. There is no history of Heart disease or Stroke.  ROS:   Review of Systems  Constitution: Negative for decreased appetite, fever and weight gain.  HENT: Negative for congestion, ear discharge, hoarse voice and sore throat.   Eyes: Negative for discharge, redness, vision loss in right eye and visual halos.  Cardiovascular: Negative for chest pain, dyspnea on exertion, leg swelling, orthopnea and palpitations.  Respiratory: Negative for cough, hemoptysis, shortness of breath and snoring.   Endocrine: Negative for heat intolerance and polyphagia.  Hematologic/Lymphatic: Negative for bleeding problem. Does not bruise/bleed easily.  Skin: Negative for flushing, nail changes, rash and suspicious lesions.  Musculoskeletal: Negative for arthritis, joint pain, muscle cramps, myalgias, neck pain and stiffness.  Gastrointestinal: Negative for abdominal pain, bowel incontinence, diarrhea and excessive appetite.  Genitourinary: Negative for decreased libido, genital sores and incomplete emptying.  Neurological: Negative for brief paralysis,  focal weakness, headaches and loss of balance.  Psychiatric/Behavioral: Negative for altered mental status, depression and suicidal ideas.  Allergic/Immunologic: Negative for HIV exposure and persistent infections.    EKGs/Labs/Other Studies Reviewed:    The following studies were reviewed today:   EKG: None today   Recent Labs: 09/24/2023: ALT 7 09/30/2023: BUN 14; Creatinine, Ser 0.78; Potassium 3.5; Sodium 141 02/24/2024: Hemoglobin 12.2; Platelets 150; TSH 0.825  Recent Lipid Panel    Component Value Date/Time   CHOL 151 02/24/2024 0959   TRIG 70 02/24/2024 0959   HDL 66 02/24/2024 0959   CHOLHDL 2.3 02/24/2024 0959   LDLCALC 71 02/24/2024 0959    Physical Exam:    VS:  BP 128/76   Pulse (!) 58   Ht 5' 4 (1.626 m)   Wt 163 lb 12.8 oz (74.3 kg)   SpO2 99%   BMI 28.12 kg/m     Wt Readings from Last 3 Encounters:  03/24/24 163 lb 12.8 oz (74.3 kg)  02/24/24 158 lb (71.7 kg)  12/10/23 152 lb 3.2 oz (69 kg)  GEN: Well nourished, well developed in no acute distress HEENT: Normal NECK: No JVD; No carotid bruits LYMPHATICS: No lymphadenopathy CARDIAC: S1S2 noted,RRR, 3/6 holosystolic murmurs, rubs, gallops RESPIRATORY:  Clear to auscultation without rales, wheezing or rhonchi  ABDOMEN: Soft, non-tender, non-distended, +bowel sounds, no guarding. EXTREMITIES: No edema, No cyanosis, no clubbing MUSCULOSKELETAL:  No deformity  SKIN: Warm and dry NEUROLOGIC:  Alert and oriented x 3, non-focal PSYCHIATRIC:  Normal affect, good insight  ASSESSMENT:    1. Primary hypertension     PLAN:    She is doing well from a cardiovascular standpoint. Her blood pressure manually taken by me 128/76 mmHg.  Continue her current dose of Aldactone  and Toprol  XL.     The patient is in agreement with the above plan. The patient left the office in stable condition.  The patient will follow up in 1 year or sooner if needed.   Medication Adjustments/Labs and Tests  Ordered: Current medicines are reviewed at length with the patient today.  Concerns regarding medicines are outlined above.  No orders of the defined types were placed in this encounter.  No orders of the defined types were placed in this encounter.   Patient Instructions  Medication Instructions:  Your physician recommends that you continue on your current medications as directed. Please refer to the Current Medication list given to you today.  *If you need a refill on your cardiac medications before your next appointment, please call your pharmacy*   Follow-Up: At [facility], you and your health needs are our priority.  As part of our continuing mission to provide you with exceptional heart care, our providers are all part of one team.  This team includes your primary Cardiologist (physician) and Advanced Practice Providers or APPs (Physician Assistants and Nurse Practitioners) who all work together to provide you with the care you need, when you need it.  Your next appointment:   1 year(s)  Provider:   Anylah Scheib, DO      Adopting a Healthy Lifestyle.  Know what a healthy weight is for you (roughly BMI <25) and aim to maintain this   Aim for 7+ servings of fruits and vegetables daily   65-80+ fluid ounces of water or unsweet tea for healthy kidneys   Limit to max 1 drink of alcohol per day; avoid smoking/tobacco   Limit animal fats in diet for cholesterol and heart health - choose grass fed whenever available   Avoid highly processed foods, and foods high in saturated/trans fats   Aim for low stress - take time to unwind and care for your mental health   Aim for 150 min of moderate intensity exercise weekly for heart health, and weights twice weekly for bone health   Aim for 7-9 hours of sleep daily   When it comes to diets, agreement about the perfect plan isnt easy to find, even among the experts. Experts at the [facility] of [employer] developed  an idea known as the Healthy Eating Plate. Just imagine a plate divided into logical, healthy portions.   The emphasis is on diet quality:   Load up on vegetables and fruits - one-half of your plate: Aim for color and variety, and remember that potatoes dont count.   Go for whole grains - one-quarter of your plate: Whole wheat, barley, wheat berries, quinoa, oats, brown rice, and foods made with them. If you want pasta, go with whole wheat pasta.   Protein power - one-quarter of your  plate: Fish, chicken, beans, and nuts are all healthy, versatile protein sources. Limit red meat.   The diet, however, does go beyond the plate, offering a few other suggestions.   Use healthy plant oils, such as olive, canola, soy, corn, sunflower and peanut. Check the labels, and avoid partially hydrogenated oil, which have unhealthy trans fats.   If youre thirsty, drink water. Coffee and tea are good in moderation, but skip sugary drinks and limit milk and dairy products to one or two daily servings.   The type of carbohydrate in the diet is more important than the amount. Some sources of carbohydrates, such as vegetables, fruits, whole grains, and beans-are healthier than others.   Finally, stay active  Signed, Dub Huntsman, DO  03/24/2024 11:04 AM    [facility]

## 2024-03-24 NOTE — Patient Instructions (Addendum)

## 2024-05-24 ENCOUNTER — Other Ambulatory Visit: Payer: Self-pay | Admitting: Family Medicine

## 2024-05-24 DIAGNOSIS — I10 Essential (primary) hypertension: Secondary | ICD-10-CM

## 2024-06-21 ENCOUNTER — Other Ambulatory Visit: Payer: Self-pay | Admitting: Family Medicine

## 2024-06-21 DIAGNOSIS — I10 Essential (primary) hypertension: Secondary | ICD-10-CM

## 2024-09-29 ENCOUNTER — Telehealth: Payer: Self-pay | Admitting: Family Medicine

## 2024-09-29 DIAGNOSIS — E559 Vitamin D deficiency, unspecified: Secondary | ICD-10-CM

## 2024-09-29 MED ORDER — VITAMIN D (ERGOCALCIFEROL) 1.25 MG (50000 UNIT) PO CAPS: 50000.0000 [IU] | ORAL_CAPSULE | ORAL | 0 refills | Status: AC

## 2024-09-29 NOTE — Telephone Encounter (Signed)
[facility] faxed refill request for the following medications: ° °Vitamin D, Ergocalciferol, (DRISDOL) 1.25 MG (50000 UNIT) CAPS capsule  ° °Please advise. ° °

## 2024-09-29 NOTE — Telephone Encounter (Signed)
 Requesting: Vitamin D , Ergocalciferol , (DRISDOL ) 1.25 MG  Last level: 02/24/2024 18.8 Last Visit: 02/24/2024 Next Visit: Visit date not scheduled Last Refill: 02/26/24 #12 0rf  Please Advise
